# Patient Record
Sex: Female | Born: 1990 | Race: Black or African American | Hispanic: No | Marital: Single | Smoking: Never smoker
Health system: Southern US, Community
[De-identification: ages and names within clinical notes are randomized; demographics above are authoritative.]

## PROBLEM LIST (undated history)

## (undated) ENCOUNTER — Inpatient Hospital Stay (HOSPITAL_COMMUNITY): Payer: Self-pay

## (undated) DIAGNOSIS — A749 Chlamydial infection, unspecified: Secondary | ICD-10-CM

## (undated) DIAGNOSIS — R079 Chest pain, unspecified: Secondary | ICD-10-CM

## (undated) DIAGNOSIS — R519 Headache, unspecified: Secondary | ICD-10-CM

## (undated) DIAGNOSIS — A599 Trichomoniasis, unspecified: Secondary | ICD-10-CM

## (undated) DIAGNOSIS — F410 Panic disorder [episodic paroxysmal anxiety] without agoraphobia: Secondary | ICD-10-CM

## (undated) DIAGNOSIS — I10 Essential (primary) hypertension: Secondary | ICD-10-CM

## (undated) DIAGNOSIS — A549 Gonococcal infection, unspecified: Secondary | ICD-10-CM

## (undated) DIAGNOSIS — J45909 Unspecified asthma, uncomplicated: Secondary | ICD-10-CM

## (undated) HISTORY — DX: Chest pain, unspecified: R07.9

## (undated) HISTORY — PX: WISDOM TOOTH EXTRACTION: SHX21

---
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
## 2011-02-25 ENCOUNTER — Emergency Department (HOSPITAL_COMMUNITY): Payer: Medicaid Other

## 2011-02-25 ENCOUNTER — Emergency Department (HOSPITAL_COMMUNITY)
Admission: EM | Admit: 2011-02-25 | Discharge: 2011-02-25 | Disposition: A | Discharge status: Home / Self Care | Payer: Medicaid Other | Attending: Emergency Medicine | Admitting: Emergency Medicine

## 2011-02-25 DIAGNOSIS — J45909 Unspecified asthma, uncomplicated: Secondary | ICD-10-CM | POA: Insufficient documentation

## 2011-02-25 DIAGNOSIS — R0602 Shortness of breath: Secondary | ICD-10-CM | POA: Insufficient documentation

## 2011-02-25 DIAGNOSIS — Z79899 Other long term (current) drug therapy: Secondary | ICD-10-CM | POA: Insufficient documentation

## 2011-02-25 LAB — BASIC METABOLIC PANEL
BUN: 7 mg/dL (ref 6–23)
Creatinine, Ser: 0.68 mg/dL (ref 0.50–1.10)
GFR calc Af Amer: >60 mL/min (ref >60)
GFR calc non Af Amer: >60 mL/min (ref >60)
Potassium: 2.9 mEq/L — ABNORMAL LOW (ref 3.5–5.1)

## 2011-02-25 LAB — CBC
MCH: 32.2 pg (ref 26.0–34.0)
MCV: 93.8 fL (ref 78.0–100.0)
Platelets: 181 10*3/uL (ref 150–400)
RDW: 12 % (ref 11.5–15.5)
WBC: 5.6 10*3/uL (ref 4.0–10.5)

## 2011-02-25 LAB — DIFFERENTIAL
Basophils Relative: 1 % (ref 0–1)
Eosinophils Absolute: 0.1 10*3/uL (ref 0.0–0.7)
Eosinophils Relative: 1 % (ref 0–5)
Lymphs Abs: 1.9 10*3/uL (ref 0.7–4.0)

## 2011-02-25 MED ORDER — IOHEXOL 300 MG/ML  SOLN
85.0000 mL | Freq: Once | INTRAMUSCULAR | Status: AC | PRN
  Administered 2011-02-25: 85 mL via INTRAVENOUS

## 2011-02-28 LAB — URINALYSIS, ROUTINE W REFLEX MICROSCOPIC
Bilirubin Urine: NEGATIVE
Hgb urine dipstick: NEGATIVE
Protein, ur: NEGATIVE
Urobilinogen, UA: 1

## 2011-02-28 LAB — URINE CULTURE

## 2011-05-30 NOTE — L&D Delivery Note (Signed)
Delivery Note At 8:12 PM a viable and healthy female was delivered via Vaginal, Spontaneous Delivery (Presentation: Left Occiput Anterior).  APGAR: 9, 9; weight 7 lb 11 oz (3487 g).   Placenta status: Intact, Spontaneous.  Cord: 3 vessels.  Anesthesia: Epidural  Episiotomy: None Lacerations: 1st degree at introitus, no suture repair required. Est. Blood Loss (mL): 300  Mom to postpartum.  Baby to nursery-stable.  Aira Sallade D 03/13/2012, 8:54 PM

## 2011-06-10 ENCOUNTER — Emergency Department (HOSPITAL_COMMUNITY)
Admission: EM | Admit: 2011-06-10 | Discharge: 2011-06-10 | Disposition: A | Discharge status: Home / Self Care | Payer: Medicaid Other | Attending: Emergency Medicine | Admitting: Emergency Medicine

## 2011-06-10 ENCOUNTER — Encounter (HOSPITAL_COMMUNITY): Payer: Self-pay | Admitting: Emergency Medicine

## 2011-06-10 ENCOUNTER — Other Ambulatory Visit: Payer: Self-pay

## 2011-06-10 DIAGNOSIS — R079 Chest pain, unspecified: Secondary | ICD-10-CM | POA: Insufficient documentation

## 2011-06-10 DIAGNOSIS — S0083XA Contusion of other part of head, initial encounter: Secondary | ICD-10-CM

## 2011-06-10 DIAGNOSIS — S1093XA Contusion of unspecified part of neck, initial encounter: Secondary | ICD-10-CM | POA: Insufficient documentation

## 2011-06-10 DIAGNOSIS — F410 Panic disorder [episodic paroxysmal anxiety] without agoraphobia: Secondary | ICD-10-CM | POA: Insufficient documentation

## 2011-06-10 DIAGNOSIS — R51 Headache: Secondary | ICD-10-CM | POA: Insufficient documentation

## 2011-06-10 DIAGNOSIS — S0003XA Contusion of scalp, initial encounter: Secondary | ICD-10-CM | POA: Insufficient documentation

## 2011-06-10 HISTORY — DX: Panic disorder (episodic paroxysmal anxiety): F41.0

## 2011-06-10 MED ORDER — LORAZEPAM 1 MG PO TABS
1.0000 mg | ORAL_TABLET | Freq: Once | ORAL | Status: AC
  Administered 2011-06-10: 1 mg via ORAL
  Filled 2011-06-10: qty 1

## 2011-06-10 MED ORDER — ACETAMINOPHEN 325 MG PO TABS
650.0000 mg | ORAL_TABLET | Freq: Once | ORAL | Status: AC
  Administered 2011-06-10: 650 mg via ORAL
  Filled 2011-06-10: qty 2

## 2011-06-10 MED ORDER — LORAZEPAM 2 MG/ML IJ SOLN: 1.0000 mg | Freq: Once | INTRAMUSCULAR | Status: DC
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
## 2011-06-11 NOTE — ED Provider Notes (Signed)
Medical screening examination/treatment/procedure(s) were performed by non-physician practitioner and as supervising physician I was immediately available for consultation/collaboration.  Juliet Rude. Rubin Payor, MD 06/11/11 [phone]

## 2011-08-04 ENCOUNTER — Encounter (HOSPITAL_COMMUNITY): Payer: Self-pay

## 2011-08-04 ENCOUNTER — Inpatient Hospital Stay (HOSPITAL_COMMUNITY)
Admission: AD | Admit: 2011-08-04 | Discharge: 2011-08-04 | Disposition: A | Discharge status: Home Health | Payer: Medicaid Other | Source: Ambulatory Visit | Attending: Obstetrics and Gynecology | Admitting: Obstetrics and Gynecology

## 2011-08-04 DIAGNOSIS — O210 Mild hyperemesis gravidarum: Secondary | ICD-10-CM | POA: Insufficient documentation

## 2011-08-04 DIAGNOSIS — Z3201 Encounter for pregnancy test, result positive: Secondary | ICD-10-CM

## 2011-08-04 HISTORY — DX: Gonococcal infection, unspecified: A54.9

## 2011-08-04 HISTORY — DX: Chlamydial infection, unspecified: A74.9

## 2011-08-04 HISTORY — DX: Trichomoniasis, unspecified: A59.9

## 2011-08-04 LAB — URINALYSIS, ROUTINE W REFLEX MICROSCOPIC
Glucose, UA: NEGATIVE mg/dL
Ketones, ur: NEGATIVE mg/dL
Leukocytes, UA: NEGATIVE
Specific Gravity, Urine: >1.03 — ABNORMAL HIGH (ref 1.005–1.030)
pH: 6 (ref 5.0–8.0)

## 2011-08-04 LAB — POCT PREGNANCY, URINE: Preg Test, Ur: POSITIVE — AB

## 2011-08-04 MED ORDER — PRENATAL RX 60-1 MG PO TABS: 1.0000 | ORAL_TABLET | Freq: Every day | ORAL | Status: AC

## 2011-08-04 MED ORDER — ONDANSETRON HCL 4 MG PO TABS: 4.0000 mg | ORAL_TABLET | Freq: Three times a day (TID) | ORAL | Status: AC | PRN

## 2011-08-04 MED ORDER — PROMETHAZINE HCL 12.5 MG PO TABS: 12.5000 mg | ORAL_TABLET | Freq: Four times a day (QID) | ORAL | Status: AC | PRN

## 2011-08-04 NOTE — ED Provider Notes (Signed)
History     Chief Complaint  Patient presents with  . Emesis  . Amenorrhea   HPI Patient is a 21 yo presenting for nausea and vomiting. States she has increased nausea and vomiting in the morning and at night. Nothing makes it better; nothing makes it worse. LMP was 06/10/11. She CP, SOB, Abd pain, dysuria, vaginal bleeding, vaginal discharge.   Patient does not have an Ob/Gyn provider  OB History    Grav Para Term Preterm Abortions TAB SAB Ect Mult Living   1               Past Medical History  Diagnosis Date  . Panic attack   . Chlamydia   . Gonorrhea   . Trichomonas     History reviewed. No pertinent past surgical history.  History reviewed. No pertinent family history.  History  Substance Use Topics  . Smoking status: Never Smoker   . Smokeless tobacco: Not on file  . Alcohol Use: No    Allergies:  Allergies  Allergen Reactions  . Hydrocodone     Itching and swelling    Prescriptions prior to admission  Medication Sig Dispense Refill  . albuterol (PROVENTIL HFA;VENTOLIN HFA) 108 (90 BASE) MCG/ACT inhaler Inhale 2 puffs into the lungs every 6 (six) hours as needed. For shortness of breath      . naproxen sodium (ANAPROX) 220 MG tablet Take 220 mg by mouth 2 (two) times daily with a meal. For pain        ROS Negative except as noted in HPI  Physical Exam   Blood pressure 124/73, pulse 92, temperature 98.5 F (36.9 C), temperature source Oral, resp. rate 16, height 5' 3.25" (1.607 m), weight 70.534 kg (155 lb 8 oz), last menstrual period 06/10/2011.  Physical Exam  Constitutional: She is oriented to person, place, and time. She appears well-developed and well-nourished. No distress.  HENT:  Head: Normocephalic and atraumatic.  Neck: Normal range of motion.  Cardiovascular: Normal rate, regular rhythm and normal heart sounds.   Respiratory: Effort normal and breath sounds normal. She has no wheezes.  GI: Soft. Bowel sounds are normal. She exhibits no  distension. There is no tenderness.  Musculoskeletal: Normal range of motion. She exhibits no edema and no tenderness.  Neurological: She is alert and oriented to person, place, and time. No cranial nerve deficit.  Skin: Skin is dry. No rash noted.    MAU Course  Procedures Results for orders placed during the hospital encounter of 08/04/11 (from the past 24 hour(s))  URINALYSIS, ROUTINE W REFLEX MICROSCOPIC     Status: Abnormal   Collection Time   08/04/11  8:38 PM      Component Value Range   Color, Urine YELLOW  YELLOW    APPearance CLEAR  CLEAR    Specific Gravity, Urine >1.030 (*) 1.005 - 1.030    pH 6.0  5.0 - 8.0    Glucose, UA NEGATIVE  NEGATIVE (mg/dL)   Hgb urine dipstick NEGATIVE  NEGATIVE    Bilirubin Urine NEGATIVE  NEGATIVE    Ketones, ur NEGATIVE  NEGATIVE (mg/dL)   Protein, ur NEGATIVE  NEGATIVE (mg/dL)   Urobilinogen, UA 0.2  0.0 - 1.0 (mg/dL)   Nitrite NEGATIVE  NEGATIVE    Leukocytes, UA NEGATIVE  NEGATIVE   POCT PREGNANCY, URINE     Status: Abnormal   Collection Time   08/04/11  9:15 PM      Component Value Range   Preg Test,  Ur POSITIVE (*) NEGATIVE     MDM No complaints at this time. Pelvic exam deferred, which was discussed with Joy Hartman.  Assessment and Plan  21 yo G1 at [redacted]w[redacted]d by LMP presenting for nausea - Pregnancy test positive. Patient states she will establish prenatal care from the list provided. Rx for prenatal vitamin sent to pharmacy. - Zofran 4mg  prn for nausea - Bleeding precautions given to patient. - Pregnancy verification letter given to patient - Plan discussed with Joy Hartman who agrees with plan  Joy Hartman 08/04/2011, 9:45 PM

## 2011-08-04 NOTE — Progress Notes (Signed)
Patient is here with c/p lower constant abdominal cramping and nausea since feb. 14th. She denies any vaginal bleeding or discharge.

## 2011-08-04 NOTE — Discharge Instructions (Signed)
    ________________________________________     To schedule your Maternity Eligibility Appointment, please call [phone].  When you arrive for your appointment you must bring the following items or information listed below.  Your appointment will be rescheduled if you do not have these items or are 15 minutes late. If currently receiving Medicaid, you MUST bring: 1. Medicaid Card 2. Social Security Card 3. Picture ID 4. Proof of Pregnancy 5. Verification of current address if the address on Medicaid card is incorrect "postmarked mail" If not receiving Medicaid, you MUST bring: 1. Social Security Card 2. Picture ID 3. Birth Certificate (if available) Passport or *Green Card 4. Proof of Pregnancy 5. Verification of current address "postmarked mail" for each income presented. 6. Verification of insurance coverage, if any 7. Check stubs from each employer for the previous month (if unable to present check stub  for each week, we will accept check stub for the first and last week ill the same month.) If you can't locate check stubs, you must bring a letter from the employer(s) and it must have the following information on letterhead, typed, in English: o name of company o company telephone number o how long been with the company, if less than one month o how much person earns per hour o how many hours per week work o the gross pay the person earned for the previous month If you are 19 years old or less, you do not have to bring proof of income unless you work or live with the father of the baby and at that time we will need proof of income from you and/or the father of the baby. Green Card recipients are eligible for Medicaid for Pregnant Women (MPW)    

## 2011-08-04 NOTE — Progress Notes (Signed)
Pt states, " I've been vomiting for 2-3 weeks mostly in the morning and at night. I missed my period in Feb."

## 2011-08-17 LAB — OB RESULTS CONSOLE GC/CHLAMYDIA: Chlamydia: NEGATIVE

## 2011-09-08 LAB — OB RESULTS CONSOLE HGB/HCT, BLOOD: HCT: 36 %

## 2011-09-08 LAB — OB RESULTS CONSOLE HEPATITIS B SURFACE ANTIGEN: Hepatitis B Surface Ag: NEGATIVE

## 2011-09-08 LAB — OB RESULTS CONSOLE ANTIBODY SCREEN: Antibody Screen: NEGATIVE

## 2011-09-08 LAB — OB RESULTS CONSOLE RUBELLA ANTIBODY, IGM: Rubella: IMMUNE

## 2011-09-08 LAB — OB RESULTS CONSOLE RPR: RPR: NONREACTIVE

## 2012-01-22 ENCOUNTER — Encounter (HOSPITAL_COMMUNITY): Payer: Self-pay | Admitting: *Deleted

## 2012-01-22 ENCOUNTER — Inpatient Hospital Stay (HOSPITAL_COMMUNITY)
Admission: EM | Admit: 2012-01-22 | Discharge: 2012-01-22 | Disposition: A | Discharge status: Home / Self Care | Payer: Medicaid Other | Attending: Obstetrics and Gynecology | Admitting: Obstetrics and Gynecology

## 2012-01-22 DIAGNOSIS — M549 Dorsalgia, unspecified: Secondary | ICD-10-CM | POA: Insufficient documentation

## 2012-01-22 DIAGNOSIS — O479 False labor, unspecified: Secondary | ICD-10-CM

## 2012-01-22 DIAGNOSIS — O99891 Other specified diseases and conditions complicating pregnancy: Secondary | ICD-10-CM | POA: Insufficient documentation

## 2012-01-22 HISTORY — DX: Unspecified asthma, uncomplicated: J45.909

## 2012-01-22 LAB — WET PREP, GENITAL

## 2012-01-22 LAB — POCT I-STAT, CHEM 8
BUN: 4 mg/dL — ABNORMAL LOW (ref 6–23)
Creatinine, Ser: 0.7 mg/dL (ref 0.50–1.10)
Hemoglobin: 10.5 g/dL — ABNORMAL LOW (ref 12.0–15.0)
Potassium: 3.6 mEq/L (ref 3.5–5.1)
Sodium: 137 mEq/L (ref 135–145)

## 2012-01-22 LAB — CBC WITH DIFFERENTIAL/PLATELET
Basophils Absolute: 0 10*3/uL (ref 0.0–0.1)
Lymphocytes Relative: 30 % (ref 12–46)
Lymphs Abs: 2.4 10*3/uL (ref 0.7–4.0)
Neutrophils Relative %: 58 % (ref 43–77)
Platelets: 176 10*3/uL (ref 150–400)
RBC: 3.3 MIL/uL — ABNORMAL LOW (ref 3.87–5.11)
RDW: 12.3 % (ref 11.5–15.5)
WBC: 8 10*3/uL (ref 4.0–10.5)

## 2012-01-22 LAB — URINALYSIS, ROUTINE W REFLEX MICROSCOPIC
Leukocytes, UA: NEGATIVE
Nitrite: NEGATIVE
Protein, ur: NEGATIVE mg/dL
Specific Gravity, Urine: 1.019 (ref 1.005–1.030)
Urobilinogen, UA: 0.2 mg/dL (ref 0.0–1.0)

## 2012-01-22 LAB — FETAL FIBRONECTIN: Fetal Fibronectin: NEGATIVE

## 2012-01-22 MED ORDER — SODIUM CHLORIDE 0.9 % IV BOLUS (SEPSIS)
1000.0000 mL | Freq: Once | INTRAVENOUS | Status: AC
  Administered 2012-01-22: 1000 mL via INTRAVENOUS

## 2012-01-22 NOTE — MAU Note (Signed)
Dr. Arlyce Dice notified of pt's cervical exam, unchanged, ui noted, ffn negative. Pt ready to d/c home. EFM tracing reactive. Orders received to d/c home, f/u in office 01/31/2012 as previously scheduled.

## 2012-01-22 NOTE — MAU Note (Signed)
Patient transferred from [facility]. Patient complains of leaking a few drops of fluid tonight while laying in bed. No vaginal bleeding. Some back pain which patient states is not new.

## 2012-01-22 NOTE — MAU Provider Note (Signed)
  History     CSN: [id number]  Arrival date and time: 01/22/12 0116   None     Chief Complaint  Patient presents with  . Back Pain    pt pregnant   HPI Joy Hartman is a 21 y.o. female @ 106w2d gestation who arrived to MAU via [facility]. Transferred from [facility] ED where she presented for ? ROM and back pain. She was evaluated by the EDP and the OB rapid response nurse. Dr. Claiborne Billings was contacted by the EDP and accepted the patient for transfer.  OB History    Grav Para Term Preterm Abortions TAB SAB Ect Mult Living   1               Past Medical History  Diagnosis Date  . Panic attack   . Chlamydia   . Gonorrhea   . Trichomonas     History reviewed. No pertinent past surgical history.  History reviewed. No pertinent family history.  History  Substance Use Topics  . Smoking status: Never Smoker   . Smokeless tobacco: Not on file  . Alcohol Use: No    Allergies:  Allergies  Allergen Reactions  . Hydrocodone     Itching and swelling    Prescriptions prior to admission  Medication Sig Dispense Refill  . albuterol (PROVENTIL HFA;VENTOLIN HFA) 108 (90 BASE) MCG/ACT inhaler Inhale 2 puffs into the lungs every 6 (six) hours as needed. For shortness of breath      . beclomethasone (QVAR) 40 MCG/ACT inhaler Inhale 2 puffs into the lungs 2 (two) times daily.      . Prenatal Vit-Fe Fumarate-FA (PRENATAL MULTIVITAMIN) 60-1 MG tablet Take 1 tablet by mouth daily.  30 tablet  11    ROS Physical Exam   Blood pressure 120/73, pulse 97, temperature 97.9 F (36.6 C), temperature source Oral, resp. rate 18, last menstrual period 06/10/2011, SpO2 98.00%.  Physical Exam  Constitutional: She is oriented to person, place, and time. She appears well-developed and well-nourished. No distress.  HENT:  Head: Normocephalic and atraumatic.  Eyes: EOM are normal.  Neck: Neck supple.  Cardiovascular: Normal rate.   Respiratory: Effort normal.  GI: Soft. There is no  tenderness.  Genitourinary:       External genitalia without lesions. Thick yellow mucous discharge vaginal vault. Cervix inflamed. Cervical exam: Dilation: 1 Effacement (%): Thick Station: -3 Exam by:: L. Munford RN   Musculoskeletal: Normal range of motion.  Neurological: She is alert and oriented to person, place, and time.  Skin: Skin is warm and dry.  Psychiatric: She has a normal mood and affect. Her behavior is normal. Judgment and thought content normal.   EFM: Baseline 140, irregular contractions with irritability, reactive tracing  MAU Course: Discussed with Dr. Claiborne Billings and she request that we continue to monitor the patient for at least another hour and then recheck cervix.   Procedures Medical screening exam complete and patient stable to continue to monitor. RN to recheck cervix and call Dr. Claiborne Billings with results.  Nancyjo Givhan, RN, FNP, [facility] 01/22/2012, 6:03 AM

## 2012-01-22 NOTE — Progress Notes (Signed)
Notified Dr Claiborne Billings of pt @ [facility] ED and complaints.  Transfer to MAU per Dr. Claiborne Billings.

## 2012-01-22 NOTE — ED Notes (Signed)
Care assumed at this time, Rapid Response OB here with pt.

## 2012-01-22 NOTE — ED Provider Notes (Signed)
Medical screening examination/treatment/procedure(s) were performed by non-physician practitioner and as supervising physician I was immediately available for consultation/collaboration.   Eligio Angert L Camdyn Beske, MD 01/22/12 0815 

## 2012-01-22 NOTE — ED Notes (Addendum)
Pt 7 months pregnant. states back pain for the past two days. Pt state constant across lower back. Pt denies abdominal cramping or pain. Pt denies bleeding or discharge. Pt ambulatory.

## 2012-01-22 NOTE — ED Provider Notes (Signed)
History     CSN: [id number]  Arrival date & time 01/22/12  0116   First MD Initiated Contact with Patient 01/22/12 0139      Chief Complaint  Patient presents with  . Back Pain    pt pregnant    (Consider location/radiation/quality/duration/timing/severity/associated sxs/prior treatment) HPI Comments: Patient is 7 months pregnant having LBP for several days today also having abdominal cramping and thinks she may be leaking fluid vaginally Denies dysuria, N/V/D  States she was seen 8 days ago by her PCP for LBP and told to take tylenol  Tonight pain worse   Patient is a 21 y.o. female presenting with back pain. The history is provided by the patient.  Back Pain  This is a new problem. The current episode started 6 to 12 hours ago. The problem occurs constantly. The problem has been gradually worsening. The pain is present in the lumbar spine. The pain is at a severity of 5/10. The pain is moderate. Pertinent negatives include no chest pain and no fever.    Past Medical History  Diagnosis Date  . Panic attack   . Chlamydia   . Gonorrhea   . Trichomonas     History reviewed. No pertinent past surgical history.  History reviewed. No pertinent family history.  History  Substance Use Topics  . Smoking status: Never Smoker   . Smokeless tobacco: Not on file  . Alcohol Use: No    OB History    Grav Para Term Preterm Abortions TAB SAB Ect Mult Living   1               Review of Systems  Constitutional: Negative for fever and chills.  Respiratory: Negative for shortness of breath.   Cardiovascular: Negative for chest pain and leg swelling.  Genitourinary: Positive for vaginal discharge.  Musculoskeletal: Positive for back pain.    Allergies  Hydrocodone  Home Medications   Current Outpatient Rx  Name Route Sig Dispense Refill  . ALBUTEROL SULFATE HFA 108 (90 BASE) MCG/ACT IN AERS Inhalation Inhale 2 puffs into the lungs every 6 (six) hours as needed. For  shortness of breath    . BECLOMETHASONE DIPROPIONATE 40 MCG/ACT IN AERS Inhalation Inhale 2 puffs into the lungs 2 (two) times daily.    Marland Kitchen PRENATAL RX 60-1 MG PO TABS Oral Take 1 tablet by mouth daily. 30 tablet 11    BP 93/44  Pulse 93  Temp 98.3 F (36.8 C) (Oral)  Resp 18  SpO2 91%  LMP 06/10/2011  Physical Exam  Constitutional: She appears well-developed and well-nourished.  HENT:  Head: Normocephalic.  Eyes: Pupils are equal, round, and reactive to light.  Neck: Normal range of motion.  Cardiovascular: Normal rate.   Pulmonary/Chest: Effort normal.  Abdominal: She exhibits distension.       gravid  Genitourinary:       Deferred to OB response team   Musculoskeletal: Normal range of motion.  Neurological: She is alert.  Skin: Skin is warm.    ED Course  Procedures (including critical care time)  Labs Reviewed  URINALYSIS, ROUTINE W REFLEX MICROSCOPIC - Abnormal; Notable for the following:    APPearance CLOUDY (*)     All other components within normal limits  CBC WITH DIFFERENTIAL - Abnormal; Notable for the following:    RBC 3.30 (*)     Hemoglobin 10.8 (*)     HCT 30.1 (*)     All other components within normal limits  POCT  I-STAT, CHEM 8 - Abnormal; Notable for the following:    BUN 4 (*)     Calcium, Ion 1.24 (*)     Hemoglobin 10.5 (*)     HCT 31.0 (*)     All other components within normal limits   No results found.   1. Preterm contractions       MDM  Will call OB Rapid response team for evaluation, place the fetal monitor         Arman Filter, NP 01/22/12 0404  Arman Filter, NP 01/22/12 0404  Arman Filter, NP 01/22/12 1027

## 2012-01-22 NOTE — Progress Notes (Addendum)
Pt is a G1P0, 32wk2da gestation [facility] 10/19). She is complaining of back pain x 2 days, getting worse earlier tonight; no bleeding or leaking fluid. Fetal monitoring and toco applied.  Marcie Bal RN OBRR On further discussion, pt states she might be ruptured; describes leaking 4 drops of fluid while up to bathroom.  Pt states panties dry and no pad needed.

## 2012-01-23 LAB — GC/CHLAMYDIA PROBE AMP, GENITAL
Chlamydia, DNA Probe: NEGATIVE
GC Probe Amp, Genital: NEGATIVE

## 2012-02-14 LAB — OB RESULTS CONSOLE GBS: GBS: NEGATIVE

## 2012-03-05 ENCOUNTER — Ambulatory Visit (INDEPENDENT_AMBULATORY_CARE_PROVIDER_SITE_OTHER): Payer: Medicaid Other | Admitting: Pediatrics

## 2012-03-13 ENCOUNTER — Inpatient Hospital Stay (HOSPITAL_COMMUNITY): Payer: Medicaid Other | Admitting: Anesthesiology

## 2012-03-13 ENCOUNTER — Inpatient Hospital Stay (HOSPITAL_COMMUNITY)
Admission: AD | Admit: 2012-03-13 | Discharge: 2012-03-15 | DRG: 775 | Disposition: A | Discharge status: Home / Self Care | Payer: Medicaid Other | Source: Ambulatory Visit | Attending: Obstetrics and Gynecology | Admitting: Obstetrics and Gynecology

## 2012-03-13 ENCOUNTER — Encounter (HOSPITAL_COMMUNITY): Payer: Self-pay | Admitting: *Deleted

## 2012-03-13 ENCOUNTER — Encounter (HOSPITAL_COMMUNITY): Payer: Self-pay | Admitting: Anesthesiology

## 2012-03-13 LAB — CBC
HCT: 32.1 % — ABNORMAL LOW (ref 36.0–46.0)
Hemoglobin: 11.4 g/dL — ABNORMAL LOW (ref 12.0–15.0)
MCV: 90.9 fL (ref 78.0–100.0)
RBC: 3.53 MIL/uL — ABNORMAL LOW (ref 3.87–5.11)
WBC: 10.9 10*3/uL — ABNORMAL HIGH (ref 4.0–10.5)

## 2012-03-13 LAB — URINALYSIS, ROUTINE W REFLEX MICROSCOPIC
Bilirubin Urine: NEGATIVE
Glucose, UA: NEGATIVE mg/dL
Specific Gravity, Urine: 1.02 (ref 1.005–1.030)
Urobilinogen, UA: 2 mg/dL — ABNORMAL HIGH (ref 0.0–1.0)

## 2012-03-13 LAB — URINE MICROSCOPIC-ADD ON

## 2012-03-13 LAB — ABO/RH: ABO/RH(D): O POS

## 2012-03-13 MED ORDER — OXYTOCIN 40 UNITS IN LACTATED RINGERS INFUSION - SIMPLE MED
1.0000 m[IU]/min | INTRAVENOUS | Status: DC
  Administered 2012-03-13: 2 m[IU]/min via INTRAVENOUS
  Filled 2012-03-13: qty 1000

## 2012-03-13 MED ORDER — BECLOMETHASONE DIPROPIONATE 40 MCG/ACT IN AERS
1.0000 | INHALATION_SPRAY | Freq: Two times a day (BID) | RESPIRATORY_TRACT | Status: DC
  Administered 2012-03-14 – 2012-03-15 (×3): 1 via RESPIRATORY_TRACT

## 2012-03-13 MED ORDER — WITCH HAZEL-GLYCERIN EX PADS: 1.0000 "application " | MEDICATED_PAD | CUTANEOUS | Status: DC | PRN

## 2012-03-13 MED ORDER — SENNOSIDES-DOCUSATE SODIUM 8.6-50 MG PO TABS
2.0000 | ORAL_TABLET | Freq: Every day | ORAL | Status: DC
  Administered 2012-03-14: 2 via ORAL

## 2012-03-13 MED ORDER — CITRIC ACID-SODIUM CITRATE 334-500 MG/5ML PO SOLN: 30.0000 mL | ORAL | Status: DC | PRN

## 2012-03-13 MED ORDER — PRENATAL MULTIVITAMIN CH
1.0000 | ORAL_TABLET | Freq: Every day | ORAL | Status: DC
  Administered 2012-03-14 – 2012-03-15 (×2): 1 via ORAL
  Filled 2012-03-13 (×2): qty 1

## 2012-03-13 MED ORDER — EPHEDRINE 5 MG/ML INJ: 10.0000 mg | INTRAVENOUS | Status: DC | PRN

## 2012-03-13 MED ORDER — OXYTOCIN 40 UNITS IN LACTATED RINGERS INFUSION - SIMPLE MED
62.5000 mL/h | Freq: Once | INTRAVENOUS | Status: AC
  Administered 2012-03-13: 62.5 mL/h via INTRAVENOUS

## 2012-03-13 MED ORDER — PNEUMOCOCCAL VAC POLYVALENT 25 MCG/0.5ML IJ INJ
0.5000 mL | INJECTION | INTRAMUSCULAR | Status: AC
  Administered 2012-03-14: 0.5 mL via INTRAMUSCULAR
  Filled 2012-03-13: qty 0.5

## 2012-03-13 MED ORDER — OXYTOCIN BOLUS FROM INFUSION
500.0000 mL | Freq: Once | INTRAVENOUS | Status: DC
  Filled 2012-03-13: qty 500

## 2012-03-13 MED ORDER — SIMETHICONE 80 MG PO CHEW: 80.0000 mg | CHEWABLE_TABLET | ORAL | Status: DC | PRN

## 2012-03-13 MED ORDER — LANOLIN HYDROUS EX OINT: TOPICAL_OINTMENT | CUTANEOUS | Status: DC | PRN

## 2012-03-13 MED ORDER — ZOLPIDEM TARTRATE 5 MG PO TABS: 5.0000 mg | ORAL_TABLET | Freq: Every evening | ORAL | Status: DC | PRN

## 2012-03-13 MED ORDER — LIDOCAINE HCL (PF) 1 % IJ SOLN
30.0000 mL | INTRAMUSCULAR | Status: DC | PRN
  Filled 2012-03-13: qty 30

## 2012-03-13 MED ORDER — PHENYLEPHRINE 40 MCG/ML (10ML) SYRINGE FOR IV PUSH (FOR BLOOD PRESSURE SUPPORT)
80.0000 ug | PREFILLED_SYRINGE | INTRAVENOUS | Status: DC | PRN
  Filled 2012-03-13: qty 5

## 2012-03-13 MED ORDER — EPHEDRINE 5 MG/ML INJ
10.0000 mg | INTRAVENOUS | Status: DC | PRN
  Filled 2012-03-13: qty 4

## 2012-03-13 MED ORDER — BUTORPHANOL TARTRATE 1 MG/ML IJ SOLN: 1.0000 mg | INTRAMUSCULAR | Status: DC | PRN

## 2012-03-13 MED ORDER — BENZOCAINE-MENTHOL 20-0.5 % EX AERO
1.0000 "application " | INHALATION_SPRAY | CUTANEOUS | Status: DC | PRN
  Administered 2012-03-15: 1 via TOPICAL
  Filled 2012-03-13: qty 56

## 2012-03-13 MED ORDER — ONDANSETRON HCL 4 MG PO TABS: 4.0000 mg | ORAL_TABLET | ORAL | Status: DC | PRN

## 2012-03-13 MED ORDER — DIBUCAINE 1 % RE OINT: 1.0000 "application " | TOPICAL_OINTMENT | RECTAL | Status: DC | PRN

## 2012-03-13 MED ORDER — OXYCODONE-ACETAMINOPHEN 5-325 MG PO TABS
1.0000 | ORAL_TABLET | ORAL | Status: DC | PRN
  Administered 2012-03-14 (×2): 1 via ORAL
  Filled 2012-03-13 (×2): qty 1

## 2012-03-13 MED ORDER — LACTATED RINGERS IV SOLN: 500.0000 mL | INTRAVENOUS | Status: DC | PRN

## 2012-03-13 MED ORDER — PHENYLEPHRINE 40 MCG/ML (10ML) SYRINGE FOR IV PUSH (FOR BLOOD PRESSURE SUPPORT): 80.0000 ug | PREFILLED_SYRINGE | INTRAVENOUS | Status: DC | PRN

## 2012-03-13 MED ORDER — LIDOCAINE HCL (PF) 1 % IJ SOLN
INTRAMUSCULAR | Status: DC | PRN
  Administered 2012-03-13 (×2): 9 mL

## 2012-03-13 MED ORDER — LACTATED RINGERS IV SOLN
INTRAVENOUS | Status: DC
  Administered 2012-03-13 (×2): via INTRAVENOUS

## 2012-03-13 MED ORDER — ONDANSETRON HCL 4 MG/2ML IJ SOLN: 4.0000 mg | Freq: Four times a day (QID) | INTRAMUSCULAR | Status: DC | PRN

## 2012-03-13 MED ORDER — LACTATED RINGERS IV SOLN
500.0000 mL | Freq: Once | INTRAVENOUS | Status: AC
  Administered 2012-03-13: 14:00:00 via INTRAVENOUS

## 2012-03-13 MED ORDER — INFLUENZA VIRUS VACC SPLIT PF IM SUSP: 0.5000 mL | INTRAMUSCULAR | Status: DC

## 2012-03-13 MED ORDER — ACETAMINOPHEN 325 MG PO TABS: 650.0000 mg | ORAL_TABLET | ORAL | Status: DC | PRN

## 2012-03-13 MED ORDER — DIPHENHYDRAMINE HCL 25 MG PO CAPS: 25.0000 mg | ORAL_CAPSULE | Freq: Four times a day (QID) | ORAL | Status: DC | PRN

## 2012-03-13 MED ORDER — OXYCODONE-ACETAMINOPHEN 5-325 MG PO TABS: 1.0000 | ORAL_TABLET | ORAL | Status: DC | PRN

## 2012-03-13 MED ORDER — ONDANSETRON HCL 4 MG/2ML IJ SOLN: 4.0000 mg | INTRAMUSCULAR | Status: DC | PRN

## 2012-03-13 MED ORDER — TETANUS-DIPHTH-ACELL PERTUSSIS 5-2.5-18.5 LF-MCG/0.5 IM SUSP
0.5000 mL | Freq: Once | INTRAMUSCULAR | Status: AC
  Administered 2012-03-14: 0.5 mL via INTRAMUSCULAR
  Filled 2012-03-13: qty 0.5

## 2012-03-13 MED ORDER — DIPHENHYDRAMINE HCL 50 MG/ML IJ SOLN: 12.5000 mg | INTRAMUSCULAR | Status: DC | PRN

## 2012-03-13 MED ORDER — IBUPROFEN 600 MG PO TABS: 600.0000 mg | ORAL_TABLET | Freq: Four times a day (QID) | ORAL | Status: DC | PRN

## 2012-03-13 MED ORDER — IBUPROFEN 600 MG PO TABS
600.0000 mg | ORAL_TABLET | Freq: Four times a day (QID) | ORAL | Status: DC
  Administered 2012-03-14 – 2012-03-15 (×5): 600 mg via ORAL
  Filled 2012-03-13 (×6): qty 1

## 2012-03-13 MED ORDER — TERBUTALINE SULFATE 1 MG/ML IJ SOLN: 0.2500 mg | Freq: Once | INTRAMUSCULAR | Status: DC | PRN

## 2012-03-13 MED ORDER — FENTANYL 2.5 MCG/ML BUPIVACAINE 1/10 % EPIDURAL INFUSION (WH - ANES)
INTRAMUSCULAR | Status: DC | PRN
  Administered 2012-03-13: 14 mL/h via EPIDURAL

## 2012-03-13 MED ORDER — ALBUTEROL SULFATE HFA 108 (90 BASE) MCG/ACT IN AERS: 2.0000 | INHALATION_SPRAY | Freq: Four times a day (QID) | RESPIRATORY_TRACT | Status: DC | PRN

## 2012-03-13 MED ORDER — FENTANYL 2.5 MCG/ML BUPIVACAINE 1/10 % EPIDURAL INFUSION (WH - ANES)
14.0000 mL/h | INTRAMUSCULAR | Status: DC
  Filled 2012-03-13: qty 125

## 2012-03-13 NOTE — Anesthesia Procedure Notes (Signed)
Epidural Patient location during procedure: OB Start time: 03/13/2012 1:21 PM End time: 03/13/2012 1:25 PM  Staffing Anesthesiologist: Sandrea Hughs Performed by: anesthesiologist   Preanesthetic Checklist Completed: patient identified, site marked, surgical consent, pre-op evaluation, timeout performed, IV checked, risks and benefits discussed and monitors and equipment checked  Epidural Patient position: sitting Prep: site prepped and draped and DuraPrep Patient monitoring: continuous pulse ox and blood pressure Approach: midline Injection technique: LOR air  Needle:  Needle type: Tuohy  Needle gauge: 17 G Needle length: 9 cm and 9 Needle insertion depth: 6 cm Catheter type: closed end flexible Catheter size: 19 Gauge Catheter at skin depth: 10 cm Test dose: negative and Other  Assessment Sensory level: T8 Events: blood not aspirated, injection not painful, no injection resistance, negative IV test and no paresthesia  Additional Notes Reason for block:procedure for pain

## 2012-03-13 NOTE — Anesthesia Preprocedure Evaluation (Signed)
Anesthesia Evaluation  Patient identified by MRN, date of birth, ID band Patient awake    Reviewed: Allergy & Precautions, H&P , NPO status , Patient's Chart, lab work & pertinent test results  Airway Mallampati: II TM Distance: >3 FB Neck ROM: full    Dental No notable dental hx.    Pulmonary  breath sounds clear to auscultation  Pulmonary exam normal       Cardiovascular negative cardio ROS      Neuro/Psych    GI/Hepatic negative GI ROS, Neg liver ROS,   Endo/Other  negative endocrine ROS  Renal/GU negative Renal ROS     Musculoskeletal negative musculoskeletal ROS (+)   Abdominal Normal abdominal exam  (+)   Peds negative pediatric ROS (+)  Hematology negative hematology ROS (+)   Anesthesia Other Findings   Reproductive/Obstetrics (+) Pregnancy                           Anesthesia Physical Anesthesia Plan  ASA: II  Anesthesia Plan: Epidural   Post-op Pain Management:    Induction:   Airway Management Planned:   Additional Equipment:   Intra-op Plan:   Post-operative Plan:   Informed Consent: I have reviewed the patients History and Physical, chart, labs and discussed the procedure including the risks, benefits and alternatives for the proposed anesthesia with the patient or authorized representative who has indicated his/her understanding and acceptance.     Plan Discussed with:   Anesthesia Plan Comments:         Anesthesia Quick Evaluation

## 2012-03-13 NOTE — MAU Note (Signed)
Pt states h/a at present, rates 8/10.

## 2012-03-13 NOTE — Progress Notes (Signed)
Pt c/o "SOB".  Pt assessed with VSS.  Pt instructed that she may feel like that when she has a contraction.  Pt stated tjhat was when she feels it and its brief and goes away. Pt instructed to tell RN if it worsens.  Epidural level is WNL.

## 2012-03-13 NOTE — MAU Note (Signed)
Pt reports bleeding x 1 hour, some contractions. Was 4 cm in office today

## 2012-03-13 NOTE — H&P (Signed)
21 y.o. G1P0  Estimated Date of Delivery: 03/16/12 admitted at 39/[redacted] weeks gestation in labor.  Prenatal Transfer Tool  Maternal Diabetes: No Genetic Screening: Normal Maternal Ultrasounds/Referrals: Normal Fetal Ultrasounds or other Referrals:  None Maternal Substance Abuse:  No Significant Maternal Medications:  None Significant Maternal Lab Results: None Other Significant Pregnancy Complications:  None  Afebrile, VSS Heart and Lungs: No active disease Abdomen: soft, gravid, EFW AGA. Cervical exam:  4.5/100  Impression: Early labor  Plan:  TOL

## 2012-03-13 NOTE — MAU Note (Addendum)
Dr. Arlyce Dice notified pt here for labor eval, bloody show noted, efm tracing reactive, cervix 4/100/-2 intact, vertex. Prior RN felt ?hand with exam, otherwise vertex.  Orders to walk x1 hour and recheck cervix. Bp's also elevated, however no clonus, +1 DTR's, pt states h/a, order for u/a.

## 2012-03-13 NOTE — MAU Note (Signed)
PT SAYS SHE WENT TO B-ROOM- NOTICED BLOOD IN TOILET AT 0600.  HAD PAD ON- - SMALL AMT  ON PAD. NO SEX TONIGHT.   STARTED HURT BAD  ALL DAY.  WENT TO DR  TODAY- VE - 4 CM.  DENIES HSV AND MRSA.

## 2012-03-14 LAB — CBC
Platelets: 157 10*3/uL (ref 150–400)
RBC: 2.82 MIL/uL — ABNORMAL LOW (ref 3.87–5.11)
RDW: 13.1 % (ref 11.5–15.5)
WBC: 11.4 10*3/uL — ABNORMAL HIGH (ref 4.0–10.5)

## 2012-03-14 NOTE — Anesthesia Postprocedure Evaluation (Signed)
  Anesthesia Post-op Note  Patient: Joy Hartman  Procedure(s) Performed: * No procedures listed *  Patient Location: PACU and Mother/Baby  Anesthesia Type: Epidural  Level of Consciousness: awake, alert  and oriented  Airway and Oxygen Therapy: Patient Spontanous Breathing  Post-op Pain: mild  Post-op Assessment: Patient's Cardiovascular Status Stable, Respiratory Function Stable, No signs of Nausea or vomiting, Adequate PO intake and Pain level controlled  Post-op Vital Signs: stable  Complications: No apparent anesthesia complications

## 2012-03-14 NOTE — Progress Notes (Signed)
UR Chart review completed.  

## 2012-03-14 NOTE — Progress Notes (Signed)
PPD#1 Pt without c/o. VSSAF Lochia-mild IMP/ stable Plan/ routine care.

## 2012-03-15 MED ORDER — IBUPROFEN 600 MG PO TABS: 600.0000 mg | ORAL_TABLET | Freq: Four times a day (QID) | ORAL | Status: DC | PRN

## 2012-03-15 MED ORDER — DOCUSATE SODIUM 100 MG PO CAPS: 100.0000 mg | ORAL_CAPSULE | Freq: Two times a day (BID) | ORAL | Status: DC

## 2012-03-15 NOTE — Discharge Summary (Signed)
Obstetric Discharge Summary Reason for Admission: onset of labor Prenatal Procedures: ultrasound Intrapartum Procedures: spontaneous vaginal delivery Postpartum Procedures: none Complications-Operative and Postpartum: 1st degree perineal laceration Hemoglobin  Date Value Range Status  03/14/2012 9.1* 12.0 - 15.0 g/dL Final     REPEATED TO VERIFY     DELTA CHECK NOTED  09/08/2011 12.1   Final     HCT  Date Value Range Status  03/14/2012 25.9* 36.0 - 46.0 % Final  09/08/2011 36   Final    Physical Exam:  General: alert, cooperative and appears stated age Lochia: appropriate Uterine Fundus: firm  Discharge Diagnoses: Term Pregnancy-delivered  Discharge Information: Date: 03/15/2012 Activity: pelvic rest Diet: routine Medications: Ibuprofen, Colace and Vicodin Condition: improved Instructions: refer to practice specific booklet Discharge to: home Follow-up Information    Follow up with Mickel Baas, MD. In 4 weeks. (for a postpartum evaluation)    Contact information:   [address] [place] [address] [phone]          Newborn Data: Live born female  Birth Weight: 7 lb 11 oz (3487 g) APGAR: 9, 9  Home with mother.  Daelin Haste H. 03/15/2012, 8:51 AM

## 2012-12-11 ENCOUNTER — Emergency Department (HOSPITAL_COMMUNITY)
Admission: EM | Admit: 2012-12-11 | Discharge: 2012-12-11 | Disposition: A | Discharge status: Home / Self Care | Payer: Medicaid Other | Attending: Emergency Medicine | Admitting: Emergency Medicine

## 2012-12-11 ENCOUNTER — Encounter (HOSPITAL_COMMUNITY): Payer: Self-pay | Admitting: Emergency Medicine

## 2012-12-11 DIAGNOSIS — L0390 Cellulitis, unspecified: Secondary | ICD-10-CM

## 2012-12-11 DIAGNOSIS — IMO0002 Reserved for concepts with insufficient information to code with codable children: Secondary | ICD-10-CM | POA: Insufficient documentation

## 2012-12-11 DIAGNOSIS — F411 Generalized anxiety disorder: Secondary | ICD-10-CM | POA: Insufficient documentation

## 2012-12-11 DIAGNOSIS — Z8619 Personal history of other infectious and parasitic diseases: Secondary | ICD-10-CM | POA: Insufficient documentation

## 2012-12-11 DIAGNOSIS — L02411 Cutaneous abscess of right axilla: Secondary | ICD-10-CM

## 2012-12-11 DIAGNOSIS — Z79899 Other long term (current) drug therapy: Secondary | ICD-10-CM | POA: Insufficient documentation

## 2012-12-11 DIAGNOSIS — J45909 Unspecified asthma, uncomplicated: Secondary | ICD-10-CM | POA: Insufficient documentation

## 2012-12-11 MED ORDER — IBUPROFEN 400 MG PO TABS
800.0000 mg | ORAL_TABLET | Freq: Once | ORAL | Status: AC
  Administered 2012-12-11: 800 mg via ORAL
  Filled 2012-12-11: qty 2

## 2012-12-11 MED ORDER — SULFAMETHOXAZOLE-TRIMETHOPRIM 800-160 MG PO TABS: 1.0000 | ORAL_TABLET | Freq: Two times a day (BID) | ORAL | Status: DC

## 2012-12-11 NOTE — ED Notes (Signed)
Pt comfortable with d/c and f/u instructions. Prescriptiosn x1

## 2012-12-11 NOTE — ED Notes (Signed)
EDPA at bedside to perform I&D

## 2012-12-11 NOTE — ED Notes (Signed)
PT. PRESENTS WITH  ABSCESS AT RIGHT AXILLA WITH NO DRAINAGE.

## 2012-12-11 NOTE — ED Provider Notes (Signed)
History  This chart was scribed for Glade Nurse, PA-C working with Ashby Dawes, MD by Greggory Stallion, ED scribe. This patient was seen in room TR11C/TR11C and the patient's care was started at 9:28 PM.  CSN: [id number] Arrival date & time 12/11/12  2022   Chief Complaint  Patient presents with  . Abscess    The history is provided by the patient. No language interpreter was used.    HPI Comments: Joy Hartman is a 22 y.o. female who presents to the Emergency Department complaining of abscess to her right axilla that she noticed 3 days ago. She states it has grown and gotten more painful. Pt rates her pain 10/10 and is localized. She states there has been no drainage. Pt denies fever, nausea, and emesis as associated symptoms. She states she has never had an abscess before.   PCP at [facility]  Past Medical History  Diagnosis Date  . Panic attack   . Chlamydia   . Gonorrhea   . Trichomonas   . Asthma    History reviewed. No pertinent past surgical history. Family History  Problem Relation Age of Onset  . Asthma Mother   . Hypertension Mother   . Hypertension Father   . Hypertension Maternal Grandmother   . Hypertension Maternal Grandfather   . Hypertension Paternal Grandmother   . Hypertension Paternal Grandfather    History  Substance Use Topics  . Smoking status: Never Smoker   . Smokeless tobacco: Not on file  . Alcohol Use: No   OB History   Grav Para Term Preterm Abortions TAB SAB Ect Mult Living   1 1 1  0 0 0 0 0 0 1     Review of Systems  Constitutional: Negative for diaphoresis.  HENT: Negative for neck pain and neck stiffness.   Eyes: Negative for visual disturbance.  Respiratory: Negative for apnea, chest tightness and shortness of breath.   Cardiovascular: Negative for chest pain and palpitations.  Gastrointestinal: Negative for diarrhea and constipation.  Genitourinary: Negative for dysuria.  Musculoskeletal: Negative for gait  problem.  Skin: Negative for rash.       Abscess.   Neurological: Negative for dizziness, weakness, light-headedness and numbness.    Allergies  Hydrocodone  Home Medications   Current Outpatient Rx  Name  Route  Sig  Dispense  Refill  . albuterol (PROVENTIL HFA;VENTOLIN HFA) 108 (90 BASE) MCG/ACT inhaler   Inhalation   Inhale 2 puffs into the lungs every 6 (six) hours as needed. For shortness of breath         . beclomethasone (QVAR) 40 MCG/ACT inhaler   Inhalation   Inhale 2 puffs into the lungs 2 (two) times daily.         Marland Kitchen docusate sodium (COLACE) 100 MG capsule   Oral   Take 1 capsule (100 mg total) by mouth 2 (two) times daily.   60 capsule   0   . ibuprofen (ADVIL,MOTRIN) 600 MG tablet   Oral   Take 1 tablet (600 mg total) by mouth every 6 (six) hours as needed for pain.   90 tablet   0    BP 143/92  Pulse 124  Temp(Src) 99.5 F (37.5 C) (Oral)  Resp 20  SpO2 98%  Physical Exam  Nursing note and vitals reviewed. Constitutional: She is oriented to person, place, and time. She appears well-developed and well-nourished. No distress.  HENT:  Head: Normocephalic and atraumatic.  Eyes: Conjunctivae and EOM are  normal.  Neck: Normal range of motion. Neck supple.  No meningeal signs  Cardiovascular: Normal rate, regular rhythm and normal heart sounds.  Exam reveals no gallop and no friction rub.   No murmur heard. Pulmonary/Chest: Effort normal and breath sounds normal. No respiratory distress. She has no wheezes. She has no rales. She exhibits no tenderness.  Abdominal: Soft. Bowel sounds are normal. She exhibits no distension. There is no tenderness. There is no rebound and no guarding.  Musculoskeletal: Normal range of motion. She exhibits no edema and no tenderness.  Neurological: She is alert and oriented to person, place, and time. No cranial nerve deficit.  Skin: Skin is warm and dry. She is not diaphoretic. No erythema.  4 cm abscess erythematous,  edematous, amenable to drainage.   Psychiatric:  anxious    ED Course  Procedures (including critical care time)  DIAGNOSTIC STUDIES: Oxygen Saturation is 98% on RA, normal by my interpretation.    COORDINATION OF CARE: 9:34 PM-Discussed treatment plan which includes IND and antibiotic with pt at bedside and pt agreed to plan.   10:11 PM-After IND, informed pt to keep warm compresses on the abscess so it could continue draining.   INCISION AND DRAINAGE Performed by: Glade Nurse, PA-C Consent: Verbal consent obtained. Risks and benefits: risks, benefits and alternatives were discussed Type: abscess  Body area: right axilla  Anesthesia: local infiltration  Incision was made with a scalpel.  Local anesthetic: lidocaine 2% with epinephrine  Anesthetic total: 5 ml  Complexity: complex Blunt dissection to break up loculations  Drainage: purulent  Drainage amount: copious  Packing material: 1/4 in iodoform gauze  Patient tolerance: Patient tolerated the procedure well with no immediate complications.    Labs Reviewed - No data to display No results found. 1. Abscess of right axilla   2. Cellulitis     MDM  Patient with skin abscess amenable to incision and drainage and packing. Directed wound recheck in 2 days at urgent care, her primary care doctor, or this ED. Encouraged home warm soaks and flushing.  Suspect uncomplicated cellulitis based on limited area of involvement, minimal pain, no systemic signs of illness (eg, fever, chills, dehydration, altered mental status, tachypnea, tachycardia, hypotension), no risk factors for serious illness (eg, extremes of age, general debility, immunocompromised status). Will prescribe Bactrim. Discussed reasons to seek immediate care. Patient expresses understanding and agrees with plan.   I personally performed the services described in this documentation, which was scribed in my presence. The recorded information has been  reviewed and is accurate.    Glade Nurse, PA-C 12/13/12 1331

## 2012-12-14 NOTE — ED Provider Notes (Signed)
Medical screening examination/treatment/procedure(s) were performed by non-physician practitioner and as supervising physician I was immediately available for consultation/collaboration.   Ashby Dawes, MD 12/14/12 [phone]

## 2013-04-03 ENCOUNTER — Other Ambulatory Visit: Payer: Self-pay

## 2013-08-16 ENCOUNTER — Emergency Department (HOSPITAL_COMMUNITY)
Admission: EM | Admit: 2013-08-16 | Discharge: 2013-08-17 | Disposition: A | Discharge status: Home / Self Care | Payer: Medicaid Other | Attending: Emergency Medicine | Admitting: Emergency Medicine

## 2013-08-16 ENCOUNTER — Encounter (HOSPITAL_COMMUNITY): Payer: Self-pay | Admitting: Emergency Medicine

## 2013-08-16 DIAGNOSIS — R059 Cough, unspecified: Secondary | ICD-10-CM | POA: Insufficient documentation

## 2013-08-16 DIAGNOSIS — Z8619 Personal history of other infectious and parasitic diseases: Secondary | ICD-10-CM | POA: Insufficient documentation

## 2013-08-16 DIAGNOSIS — R5381 Other malaise: Secondary | ICD-10-CM | POA: Insufficient documentation

## 2013-08-16 DIAGNOSIS — F410 Panic disorder [episodic paroxysmal anxiety] without agoraphobia: Secondary | ICD-10-CM | POA: Insufficient documentation

## 2013-08-16 DIAGNOSIS — J3489 Other specified disorders of nose and nasal sinuses: Secondary | ICD-10-CM | POA: Insufficient documentation

## 2013-08-16 DIAGNOSIS — J45909 Unspecified asthma, uncomplicated: Secondary | ICD-10-CM | POA: Insufficient documentation

## 2013-08-16 DIAGNOSIS — R05 Cough: Secondary | ICD-10-CM | POA: Insufficient documentation

## 2013-08-16 DIAGNOSIS — R51 Headache: Secondary | ICD-10-CM | POA: Insufficient documentation

## 2013-08-16 DIAGNOSIS — R0602 Shortness of breath: Secondary | ICD-10-CM | POA: Insufficient documentation

## 2013-08-16 DIAGNOSIS — R519 Headache, unspecified: Secondary | ICD-10-CM

## 2013-08-16 DIAGNOSIS — J069 Acute upper respiratory infection, unspecified: Secondary | ICD-10-CM | POA: Insufficient documentation

## 2013-08-16 DIAGNOSIS — R0789 Other chest pain: Secondary | ICD-10-CM | POA: Insufficient documentation

## 2013-08-16 DIAGNOSIS — R5383 Other fatigue: Secondary | ICD-10-CM

## 2013-08-16 NOTE — ED Notes (Signed)
Pt arrived to the ED with a complaint of a headache and chest pain.  Pt has been sick for three days.  Pt has asthma and has had to use her inhaler prior to arrival but has not had any relief.  Pt states her chest pain occurs when she sneezes in which case she feels a pressure sensation.  When at rest the patient feels a pounding sensation.  Pt headache is located all over her head and is a pounding sensation.

## 2013-08-17 MED ORDER — ALBUTEROL SULFATE HFA 108 (90 BASE) MCG/ACT IN AERS
2.0000 | INHALATION_SPRAY | Freq: Once | RESPIRATORY_TRACT | Status: AC
  Administered 2013-08-17: 2 via RESPIRATORY_TRACT
  Filled 2013-08-17: qty 6.7

## 2013-08-17 MED ORDER — DIPHENHYDRAMINE HCL 50 MG/ML IJ SOLN
25.0000 mg | Freq: Once | INTRAMUSCULAR | Status: AC
  Administered 2013-08-17: 25 mg via INTRAMUSCULAR
  Filled 2013-08-17: qty 1

## 2013-08-17 MED ORDER — IPRATROPIUM-ALBUTEROL 0.5-2.5 (3) MG/3ML IN SOLN
3.0000 mL | Freq: Once | RESPIRATORY_TRACT | Status: AC
  Administered 2013-08-17: 3 mL via RESPIRATORY_TRACT
  Filled 2013-08-17: qty 3

## 2013-08-17 MED ORDER — PREDNISONE 20 MG PO TABS
60.0000 mg | ORAL_TABLET | Freq: Once | ORAL | Status: AC
  Administered 2013-08-17: 60 mg via ORAL
  Filled 2013-08-17: qty 3

## 2013-08-17 MED ORDER — KETOROLAC TROMETHAMINE 60 MG/2ML IM SOLN
60.0000 mg | Freq: Once | INTRAMUSCULAR | Status: AC
  Administered 2013-08-17: 60 mg via INTRAMUSCULAR
  Filled 2013-08-17: qty 2

## 2013-08-17 MED ORDER — LORATADINE 10 MG PO TABS: 10.0000 mg | ORAL_TABLET | Freq: Every day | ORAL | Status: DC

## 2013-08-17 MED ORDER — PREDNISONE 20 MG PO TABS: 40.0000 mg | ORAL_TABLET | Freq: Every day | ORAL | Status: DC

## 2013-08-17 MED ORDER — ALBUTEROL SULFATE (2.5 MG/3ML) 0.083% IN NEBU
2.5000 mg | INHALATION_SOLUTION | Freq: Once | RESPIRATORY_TRACT | Status: AC
  Administered 2013-08-17: 2.5 mg via RESPIRATORY_TRACT
  Filled 2013-08-17: qty 3

## 2013-08-17 NOTE — Discharge Instructions (Signed)
Sinus Headache °A sinus headache is when your sinuses become clogged or swollen. Sinus headaches can range from mild to severe.  °CAUSES °A sinus headache can have different causes, such as: °· Colds. °· Sinus infections. °· Allergies. °SYMPTOMS  °Symptoms of a sinus headache may vary and can include: °· Headache. °· Pain or pressure in the face. °· Congested or runny nose. °· Fever. °· Inability to smell. °· Pain in upper teeth. °Weather changes can make symptoms worse. °TREATMENT  °The treatment of a sinus headache depends on the cause. °· Sinus pain caused by a sinus infection may be treated with antibiotic medicine. °· Sinus pain caused by allergies may be helped by allergy medicines (antihistamines) and medicated nasal sprays. °· Sinus pain caused by congestion may be helped by flushing the nose and sinuses with saline solution. °HOME CARE INSTRUCTIONS  °· If antibiotics are prescribed, take them as directed. Finish them even if you start to feel better. °· Only take over-the-counter or prescription medicines for pain, discomfort, or fever as directed by your caregiver. °· If you have congestion, use a nasal spray to help reduce pressure. °SEEK IMMEDIATE MEDICAL CARE IF: °· You have a fever. °· You have headaches more than once a week. °· You have sensitivity to light or sound. °· You have repeated nausea and vomiting. °· You have vision problems. °· You have sudden, severe pain in your face or head. °· You have a seizure. °· You are confused. °· Your sinus headaches do not get better after treatment. Many people think they have a sinus headache when they actually have migraines or tension headaches. °MAKE SURE YOU:  °· Understand these instructions. °· Will watch your condition. °· Will get help right away if you are not doing well or get worse. °Document Released: 06/22/2004 Document Revised: 08/07/2011 Document Reviewed: 08/13/2010 °[employer]® Patient Information ©2014 [employer]. ° °

## 2013-08-17 NOTE — ED Provider Notes (Signed)
CSN: [id number]     Arrival date & time 08/16/13  2323 History   First MD Initiated Contact with Patient 08/17/13 0148     Chief Complaint  Patient presents with  . Headache  . Chest Pain    (Consider location/radiation/quality/duration/timing/severity/associated sxs/prior Treatment) HPI Comments: Patient presents for headache and chest tightness associated with recent acute URI/sinusitis symptoms. No relief of either with NSAIDs. No head injury or trauma. Patient states headache has worsened as sinus congestion has worsened.  Patient is a 23 y.o. female presenting with headaches. The history is provided by the patient. No language interpreter was used.  Headache Pain location:  Frontal Quality:  Dull Radiates to: to "entire head" Onset quality:  Gradual Duration:  3 days Timing:  Constant Progression:  Waxing and waning Chronicity:  New Context: not exposure to bright light   Relieved by:  Nothing Ineffective treatments:  NSAIDs Associated symptoms: congestion, cough, drainage, fatigue and URI   Associated symptoms: no abdominal pain, no blurred vision, no diarrhea, no ear pain, no pain, no fever, no focal weakness, no hearing loss, no nausea, no numbness, no seizures, no syncope and no vomiting      Past Medical History  Diagnosis Date  . Panic attack   . Chlamydia   . Gonorrhea   . Trichomonas   . Asthma    History reviewed. No pertinent past surgical history. Family History  Problem Relation Age of Onset  . Asthma Mother   . Hypertension Mother   . Hypertension Father   . Hypertension Maternal Grandmother   . Hypertension Maternal Grandfather   . Hypertension Paternal Grandmother   . Hypertension Paternal Grandfather    History  Substance Use Topics  . Smoking status: Never Smoker   . Smokeless tobacco: Not on file  . Alcohol Use: No   OB History   Grav Para Term Preterm Abortions TAB SAB Ect Mult Living   1 1 1  0 0 0 0 0 0 1      Review of Systems   Constitutional: Positive for fatigue. Negative for fever.  HENT: Positive for congestion, postnasal drip and rhinorrhea. Negative for ear pain, hearing loss and tinnitus.   Eyes: Negative for blurred vision, pain and visual disturbance.  Respiratory: Positive for cough, chest tightness and shortness of breath.   Cardiovascular: Negative for chest pain and syncope.  Gastrointestinal: Negative for nausea, vomiting, abdominal pain and diarrhea.  Neurological: Positive for headaches. Negative for focal weakness, seizures, syncope, weakness and numbness.  All other systems reviewed and are negative.     Allergies  Hydrocodone  Home Medications   Current Outpatient Rx  Name  Route  Sig  Dispense  Refill  . aspirin-acetaminophen-caffeine (EXCEDRIN MIGRAINE) 250-250-65 MG per tablet   Oral   Take 2 tablets by mouth every 6 (six) hours as needed for headache.         . chlorpheniramine-pseudoephedrine-acetaminophen (SINUTAB) 2-30-500 MG per tablet   Oral   Take 1 tablet by mouth every 4 (four) hours as needed for allergies.         Marland Kitchen. loratadine (CLARITIN) 10 MG tablet   Oral   Take 1 tablet (10 mg total) by mouth daily.   30 tablet   0   . predniSONE (DELTASONE) 20 MG tablet   Oral   Take 2 tablets (40 mg total) by mouth daily.   10 tablet   0    BP 128/97  Pulse 100  Temp(Src) 98.5 F (36.9  C) (Oral)  Resp 22  Wt 200 lb (90.719 kg)  SpO2 100%  Physical Exam  Nursing note and vitals reviewed. Constitutional: She is oriented to person, place, and time. She appears well-developed and well-nourished. No distress.  HENT:  Head: Normocephalic and atraumatic.  Mouth/Throat: Oropharynx is clear and moist. No oropharyngeal exudate.  Audible sinus congestion. No apparent rhinorrhea.  Eyes: Conjunctivae and EOM are normal. Pupils are equal, round, and reactive to light. No scleral icterus.  Neck: Normal range of motion. Neck supple.  Cardiovascular: Normal rate, regular  rhythm and normal heart sounds.   Pulmonary/Chest: Effort normal and breath sounds normal. No respiratory distress. She has no wheezes. She has no rales.  No retractions or accessory muscle use. Chest expansion symmetric.  Musculoskeletal: Normal range of motion.  Neurological: She is alert and oriented to person, place, and time. She has normal reflexes. No cranial nerve deficit. She exhibits normal muscle tone.  GCS 15. Speech is goal oriented. No cranial nerve deficits appreciated; symmetric arteries, no facial drooping, equal tongue protrusion, and normal shoulder shrugging. Patient has equal grip strength bilaterally with normal strength against resistance in all extremities. She moves her extremities without ataxia. No gross sensory deficits appreciated.  Skin: Skin is warm and dry. No rash noted. She is not diaphoretic. No erythema. No pallor.  Psychiatric: She has a normal mood and affect. Her behavior is normal.    ED Course  Procedures (including critical care time) Labs Review Labs Reviewed - No data to display  Imaging Review No results found.   EKG Interpretation None      MDM   Final diagnoses:  Headache  Chest tightness    Patient presents for headache and chest tightness secondary to acute URI/sinusitis symptoms. Chest tightness is worse with coughing. Patient well and nontoxic appearing, hemodynamically stable, and afebrile on arrival. Neurologic exam nonfocal. She denies thunderclap onset of HA. Lungs clear to auscultation bilaterally. No stridor or wheezing. No tachypnea, retractions, or accessory muscle use. Patient treated in ED with DuoNeb, Toradol, Benadryl, and oral prednisone.   Patient endorses improvement in all of her symptoms with this treatment regimen. She states her headache has significantly improved and she feels comfortable managing her symptoms at home; pain down to 5/10 from 10/10. My suspicion for acute intracranial process is low in this  otherwise healthy 23 year old female. Lung sounds stable after breathing treatment. Patient also endorses improvement of this with nebulizer. Patient stable for d/c with albuterol inhaler, 5 day course of prednisone, and Rx for Claritin as patient endorses a hx of seasonal allergies; not currently on any chronic management. Return precautions discussed and patient agreeable to plan with no unaddressed concerns.    Filed Vitals:   08/16/13 2329 08/17/13 0143 08/17/13 0322 08/17/13 0427  BP: 132/81 128/97  142/81  Pulse: 112 100  109  Temp: 98.7 F (37.1 C) 98.5 F (36.9 C)    TempSrc: Oral Oral    Resp: 20 22  18   Weight: 200 lb (90.719 kg)     SpO2: 96% 98% 100% 98%     Antony Madura, PA-C 08/23/13 [phone]

## 2013-08-23 NOTE — ED Provider Notes (Signed)
Medical screening examination/treatment/procedure(s) were performed by non-physician practitioner and as supervising physician I was immediately available for consultation/collaboration.   EKG Interpretation None       Joy NielsenBrian Mason Dibiasio, MD 08/23/13 1356

## 2013-10-09 ENCOUNTER — Emergency Department (HOSPITAL_COMMUNITY)
Admission: EM | Admit: 2013-10-09 | Discharge: 2013-10-09 | Disposition: A | Discharge status: Home / Self Care | Payer: Medicaid Other | Attending: Emergency Medicine | Admitting: Emergency Medicine

## 2013-10-09 ENCOUNTER — Encounter (HOSPITAL_COMMUNITY): Payer: Self-pay | Admitting: Emergency Medicine

## 2013-10-09 DIAGNOSIS — Z79899 Other long term (current) drug therapy: Secondary | ICD-10-CM | POA: Insufficient documentation

## 2013-10-09 DIAGNOSIS — Z8619 Personal history of other infectious and parasitic diseases: Secondary | ICD-10-CM | POA: Insufficient documentation

## 2013-10-09 DIAGNOSIS — Z7982 Long term (current) use of aspirin: Secondary | ICD-10-CM | POA: Insufficient documentation

## 2013-10-09 DIAGNOSIS — J45909 Unspecified asthma, uncomplicated: Secondary | ICD-10-CM | POA: Insufficient documentation

## 2013-10-09 DIAGNOSIS — L02411 Cutaneous abscess of right axilla: Secondary | ICD-10-CM

## 2013-10-09 DIAGNOSIS — IMO0002 Reserved for concepts with insufficient information to code with codable children: Secondary | ICD-10-CM | POA: Insufficient documentation

## 2013-10-09 DIAGNOSIS — Z8659 Personal history of other mental and behavioral disorders: Secondary | ICD-10-CM | POA: Insufficient documentation

## 2013-10-09 MED ORDER — OXYCODONE-ACETAMINOPHEN 5-325 MG PO TABS: 1.0000 | ORAL_TABLET | ORAL | Status: DC | PRN

## 2013-10-09 MED ORDER — LIDOCAINE HCL (PF) 1 % IJ SOLN
5.0000 mL | Freq: Once | INTRAMUSCULAR | Status: AC
  Administered 2013-10-09: 5 mL via INTRADERMAL

## 2013-10-09 MED ORDER — OXYCODONE-ACETAMINOPHEN 5-325 MG PO TABS
2.0000 | ORAL_TABLET | Freq: Once | ORAL | Status: AC
Start: 2013-10-09 — End: 2013-10-09
  Administered 2013-10-09: 2 via ORAL
  Filled 2013-10-09: qty 2

## 2013-10-09 MED ORDER — SULFAMETHOXAZOLE-TRIMETHOPRIM 800-160 MG PO TABS: 1.0000 | ORAL_TABLET | Freq: Two times a day (BID) | ORAL | Status: DC

## 2013-10-09 NOTE — ED Provider Notes (Signed)
CSN: [id number]     Arrival date & time 10/09/13  1955 History  This chart was scribed for non-physician practitioner Dierdre Forth, PA-C, working with Hurman Horn, MD, by Yevette Edwards, ED Scribe. This patient was seen in room TR07C/TR07C and the patient's care was started at 10:21 PM.   First MD Initiated Contact with Patient 10/09/13 2155     Chief Complaint  Patient presents with  . Abscess     The history is provided by the patient. No language interpreter was used.   HPI Comments: Joy Hartman is a 23 y.o. female, with a h/o asthma, who presents to the Emergency Department complaining of a suspected abscess to her right axilla which she first noticed six days ago. The pt has used an ice-pack to the abscess. She denies fever, chills, nausea, and emesis. The pt reports a h/o abscesses. She denies a h/o DM. She also denies daily medications. NO aggravating or alleviating factors.  She has a history of abscess in the axilla which have required incision and drainage in the past.  Past Medical History  Diagnosis Date  . Panic attack   . Chlamydia   . Gonorrhea   . Trichomonas   . Asthma    History reviewed. No pertinent past surgical history. Family History  Problem Relation Age of Onset  . Asthma Mother   . Hypertension Mother   . Hypertension Father   . Hypertension Maternal Grandmother   . Hypertension Maternal Grandfather   . Hypertension Paternal Grandmother   . Hypertension Paternal Grandfather    History  Substance Use Topics  . Smoking status: Never Smoker   . Smokeless tobacco: Not on file  . Alcohol Use: No   OB History   Grav Para Term Preterm Abortions TAB SAB Ect Mult Living   1 1 1  0 0 0 0 0 0 1     Review of Systems  Constitutional: Negative for fever, chills, diaphoresis, appetite change, fatigue and unexpected weight change.  HENT: Negative for mouth sores.   Eyes: Negative for visual disturbance.  Respiratory: Negative for cough, chest  tightness, shortness of breath and wheezing.   Cardiovascular: Negative for chest pain.  Gastrointestinal: Negative for nausea, vomiting, abdominal pain, diarrhea and constipation.  Endocrine: Negative for polydipsia, polyphagia and polyuria.  Genitourinary: Negative for dysuria, urgency, frequency and hematuria.  Musculoskeletal: Negative for back pain and neck stiffness.  Skin: Negative for rash.       Suspected abscess.   Allergic/Immunologic: Negative for immunocompromised state.  Neurological: Negative for syncope, light-headedness and headaches.  Hematological: Does not bruise/bleed easily.  Psychiatric/Behavioral: Negative for sleep disturbance. The patient is not nervous/anxious.   All other systems reviewed and are negative.   Allergies  Hydrocodone  Home Medications   Prior to Admission medications   Medication Sig Start Date End Date Taking? Authorizing Provider  albuterol (PROVENTIL HFA;VENTOLIN HFA) 108 (90 BASE) MCG/ACT inhaler Inhale 2 puffs into the lungs every 6 (six) hours as needed for wheezing or shortness of breath.   Yes Historical Provider, MD  aspirin-acetaminophen-caffeine (EXCEDRIN MIGRAINE) [phone] MG per tablet Take 2 tablets by mouth every 6 (six) hours as needed for headache.   Yes Historical Provider, MD  beclomethasone (QVAR) 80 MCG/ACT inhaler Inhale 2 puffs into the lungs 2 (two) times daily.   Yes Historical Provider, MD  chlorpheniramine-pseudoephedrine-acetaminophen (SINUTAB) 2-30-500 MG per tablet Take 1 tablet by mouth every 4 (four) hours as needed for allergies.   Yes Historical  Provider, MD  loratadine (CLARITIN) 10 MG tablet Take 1 tablet (10 mg total) by mouth daily. 08/17/13  Yes Antony MaduraKelly Humes, PA-C   Triage Vitals: BP 136/84  Pulse 101  Temp(Src) 99.8 F (37.7 C)  Resp 18  SpO2 99%  Physical Exam  Nursing note and vitals reviewed. Constitutional: She is oriented to person, place, and time. She appears well-developed and  well-nourished. No distress.  HENT:  Head: Normocephalic and atraumatic.  Eyes: Conjunctivae and EOM are normal. No scleral icterus.  Neck: Normal range of motion. Neck supple. No tracheal deviation present.  Cardiovascular: Normal rate, regular rhythm and intact distal pulses.   Pulmonary/Chest: Effort normal and breath sounds normal. No respiratory distress.  Abdominal: Soft. She exhibits no distension. There is no tenderness.  Musculoskeletal: Normal range of motion.  Lymphadenopathy:    She has no cervical adenopathy.  Neurological: She is alert and oriented to person, place, and time.  Skin: Skin is warm and dry. She is not diaphoretic. There is erythema.  4 by 4 cm area of induration, erythema, increased warmth to right axilla with obvious area of fluctuance.    Psychiatric: She has a normal mood and affect. Her behavior is normal.    ED Course  Procedures (including critical care time)  DIAGNOSTIC STUDIES: Oxygen Saturation is 99% on room air, normal by my interpretation.    COORDINATION OF CARE:  10:14 PM- Discussed treatment plan with patient, and the patient agreed to the plan. The plan includes incision and drainage as well as a prescription for an antibiotic and pain medication.   10:16 PM- INCISION AND DRAINAGE PROCEDURE NOTE: Patient identification was confirmed and verbal consent was obtained. This procedure was performed by Dierdre ForthHannah Sidonie Dexheimer, PA-C, at 10:16 PM. Site:  Right  Sterile procedures observed  Needle size: 25 gauge Anesthetic used (type and amt): 1% lidocaine without epi; 8 cc used  Blade size: 11 Drainage: Moderate purulent Complexity: Complex Site anesthetized, incision made over site, wound drained and explored loculations, rinsed with copious amounts of normal saline, wound packed with sterile gauze, covered with dry, sterile dressing.  Pt tolerated procedure well without complications.  Instructions for care discussed verbally and pt provided  with additional written instructions for homecare and f/u.   Labs Review Labs Reviewed - No data to display  Imaging Review No results found.   EKG Interpretation None      MDM   Final diagnoses:  Abscess of right axilla   Joy Hartman presents with Hx and PE consistent with abscess.  Patient with skin abscess amenable to incision and drainage.  Abscess was not large enough to warrant packing or drain,  wound recheck in 2 days. Encouraged home warm soaks and flushing.  Mild signs of cellulitis is surrounding skin.  Will d/c to home.  Patient reports requiring antibiotic therapy in the past for her abscesses. We'll discharge home with Bactrim.  Patient is followup. Her doctor in 2 days for wound check. She is to return here to the emergency department for high fevers, worsening erythema or any other concerning symptoms.  It has been determined that no acute conditions requiring further emergency intervention are present at this time. The patient/guardian have been advised of the diagnosis and plan. We have discussed signs and symptoms that warrant return to the ED, such as changes or worsening in symptoms.   Vital signs are stable at discharge. Pt very nervous about procedure and tachycardic at triage.  NO tachycardia on my exam.  BP 136/84  Pulse 101  Temp(Src) 99.8 F (37.7 C)  Resp 18  SpO2 99%  Patient/guardian has voiced understanding and agreed to follow-up with the PCP or specialist.  I personally performed the services described in this documentation, which was scribed in my presence. The recorded information has been reviewed and is accurate.    Dahlia ClientHannah Braddock Servellon, PA-C 10/10/13 [phone]

## 2013-10-09 NOTE — ED Notes (Signed)
Pt. reports right upper arm / right shoulder joint pain onset last week denies injury , pain worse with movement .

## 2013-10-09 NOTE — Discharge Instructions (Signed)
1. Medications: percocet, vicodin, usual home medications 2. Treatment: rest, drink plenty of fluids,  3. Follow Up: Please followup with your primary doctor for discussion of your diagnoses and further evaluation after today's visit; if you do not have a primary care doctor use the resource guide provided to find one;    Abscess An abscess is an infected area that contains a collection of pus and debris.It can occur in almost any part of the body. An abscess is also known as a furuncle or boil. CAUSES  An abscess occurs when tissue gets infected. This can occur from blockage of oil or sweat glands, infection of hair follicles, or a minor injury to the skin. As the body tries to fight the infection, pus collects in the area and creates pressure under the skin. This pressure causes pain. People with weakened immune systems have difficulty fighting infections and get certain abscesses more often.  SYMPTOMS Usually an abscess develops on the skin and becomes a painful mass that is red, warm, and tender. If the abscess forms under the skin, you may feel a moveable soft area under the skin. Some abscesses break open (rupture) on their own, but most will continue to get worse without care. The infection can spread deeper into the body and eventually into the bloodstream, causing you to feel ill.  DIAGNOSIS  Your caregiver will take your medical history and perform a physical exam. A sample of fluid may also be taken from the abscess to determine what is causing your infection. TREATMENT  Your caregiver may prescribe antibiotic medicines to fight the infection. However, taking antibiotics alone usually does not cure an abscess. Your caregiver may need to make a small cut (incision) in the abscess to drain the pus. In some cases, gauze is packed into the abscess to reduce pain and to continue draining the area. HOME CARE INSTRUCTIONS   Only take over-the-counter or prescription medicines for pain,  discomfort, or fever as directed by your caregiver.  If you were prescribed antibiotics, take them as directed. Finish them even if you start to feel better.  If gauze is used, follow your caregiver's directions for changing the gauze.  To avoid spreading the infection:  Keep your draining abscess covered with a bandage.  Wash your hands well.  Do not share personal care items, towels, or whirlpools with others.  Avoid skin contact with others.  Keep your skin and clothes clean around the abscess.  Keep all follow-up appointments as directed by your caregiver. SEEK MEDICAL CARE IF:   You have increased pain, swelling, redness, fluid drainage, or bleeding.  You have muscle aches, chills, or a general ill feeling.  You have a fever. MAKE SURE YOU:   Understand these instructions.  Will watch your condition.  Will get help right away if you are not doing well or get worse. Document Released: 02/22/2005 Document Revised: 11/14/2011 Document Reviewed: 07/28/2011 [facility] Patient Information 2014 [employer] [employer] [place].

## 2013-10-10 NOTE — ED Provider Notes (Signed)
Medical screening examination/treatment/procedure(s) were performed by non-physician practitioner and as supervising physician I was immediately available for consultation/collaboration.   EKG Interpretation None       Maykayla Highley M Lorance Pickeral, MD 10/10/13 2116 

## 2014-03-05 ENCOUNTER — Emergency Department (HOSPITAL_COMMUNITY)
Admission: EM | Admit: 2014-03-05 | Discharge: 2014-03-06 | Disposition: A | Discharge status: Home / Self Care | Payer: Medicaid Other | Attending: Emergency Medicine | Admitting: Emergency Medicine

## 2014-03-05 ENCOUNTER — Encounter (HOSPITAL_COMMUNITY): Payer: Self-pay | Admitting: Emergency Medicine

## 2014-03-05 DIAGNOSIS — J45909 Unspecified asthma, uncomplicated: Secondary | ICD-10-CM | POA: Insufficient documentation

## 2014-03-05 DIAGNOSIS — F410 Panic disorder [episodic paroxysmal anxiety] without agoraphobia: Secondary | ICD-10-CM | POA: Diagnosis not present

## 2014-03-05 DIAGNOSIS — R51 Headache: Secondary | ICD-10-CM | POA: Insufficient documentation

## 2014-03-05 DIAGNOSIS — R519 Headache, unspecified: Secondary | ICD-10-CM

## 2014-03-05 DIAGNOSIS — Z8619 Personal history of other infectious and parasitic diseases: Secondary | ICD-10-CM | POA: Diagnosis not present

## 2014-03-05 DIAGNOSIS — Z79899 Other long term (current) drug therapy: Secondary | ICD-10-CM | POA: Insufficient documentation

## 2014-03-05 LAB — I-STAT CHEM 8, ED
BUN: 4 mg/dL — ABNORMAL LOW (ref 6–23)
CREATININE: 0.8 mg/dL (ref 0.50–1.10)
Calcium, Ion: 1.1 mmol/L — ABNORMAL LOW (ref 1.12–1.23)
Chloride: 102 mEq/L (ref 96–112)
Glucose, Bld: 86 mg/dL (ref 70–99)
HCT: 40 % (ref 36.0–46.0)
HEMOGLOBIN: 13.6 g/dL (ref 12.0–15.0)
POTASSIUM: 3.8 meq/L (ref 3.7–5.3)
SODIUM: 139 meq/L (ref 137–147)
TCO2: 23 mmol/L (ref 0–100)

## 2014-03-05 MED ORDER — PROCHLORPERAZINE MALEATE 10 MG PO TABS: 10.0000 mg | ORAL_TABLET | Freq: Two times a day (BID) | ORAL | Status: DC | PRN

## 2014-03-05 MED ORDER — ONDANSETRON HCL 4 MG/2ML IJ SOLN
4.0000 mg | Freq: Once | INTRAMUSCULAR | Status: AC
  Administered 2014-03-05: 4 mg via INTRAVENOUS
  Filled 2014-03-05: qty 2

## 2014-03-05 MED ORDER — DIPHENHYDRAMINE HCL 50 MG/ML IJ SOLN
25.0000 mg | Freq: Once | INTRAMUSCULAR | Status: AC
  Administered 2014-03-05: 25 mg via INTRAVENOUS
  Filled 2014-03-05: qty 1

## 2014-03-05 MED ORDER — SODIUM CHLORIDE 0.9 % IV BOLUS (SEPSIS)
1000.0000 mL | Freq: Once | INTRAVENOUS | Status: AC
  Administered 2014-03-05: 1000 mL via INTRAVENOUS

## 2014-03-05 MED ORDER — PROCHLORPERAZINE EDISYLATE 5 MG/ML IJ SOLN
10.0000 mg | Freq: Once | INTRAMUSCULAR | Status: AC
  Administered 2014-03-05: 10 mg via INTRAVENOUS
  Filled 2014-03-05: qty 2

## 2014-03-05 MED ORDER — MORPHINE SULFATE 4 MG/ML IJ SOLN
4.0000 mg | Freq: Once | INTRAMUSCULAR | Status: AC
  Administered 2014-03-05: 4 mg via INTRAVENOUS
  Filled 2014-03-05: qty 1

## 2014-03-05 NOTE — Discharge Instructions (Signed)

## 2014-03-05 NOTE — ED Notes (Signed)
Pt c/o migraine with dizziness since last week. Pt taken Excedrin at home without relief.

## 2014-03-05 NOTE — ED Provider Notes (Signed)
CSN: [id number]     Arrival date & time 03/05/14  1858 History   First MD Initiated Contact with Patient 03/05/14 2154     Chief Complaint  Patient presents with  . Headache     (Consider location/radiation/quality/duration/timing/severity/associated sxs/prior Treatment) HPI Comments: Patient is a 23 year old female with history of gonorrhea, Chlamydia, trichomonas, asthma who presents to the emergency department today for evaluation of headache. Her headache has been gradually worsening for the past week. When she woke up today her headache worsened acutely. She describes her headache as pounding. She has associated photophobia and phonophobia. She has history of migraines. She does admit to intermittent tingling and numbness in her hands and feet. She is now currently experiencing the sensation. She also had a brief bout of chest pain and shortness of breath. She attributes this to the pain in her head being so severe that it "took her breath away". She has taken Excedrin, Tylenol, Advil without relief of her symptoms. This feels like her normal migraines, but is lasting longer than normal.  Patient is a 23 y.o. female presenting with headaches. The history is provided by the patient. No language interpreter was used.  Headache Associated symptoms: dizziness   Associated symptoms: no fever and no vomiting     Past Medical History  Diagnosis Date  . Panic attack   . Chlamydia   . Gonorrhea   . Trichomonas   . Asthma    History reviewed. No pertinent past surgical history. Family History  Problem Relation Age of Onset  . Asthma Mother   . Hypertension Mother   . Hypertension Father   . Hypertension Maternal Grandmother   . Hypertension Maternal Grandfather   . Hypertension Paternal Grandmother   . Hypertension Paternal Grandfather    History  Substance Use Topics  . Smoking status: Never Smoker   . Smokeless tobacco: Not on file  . Alcohol Use: No   OB History   Grav Para  Term Preterm Abortions TAB SAB Ect Mult Living   1 1 1  0 0 0 0 0 0 1     Review of Systems  Constitutional: Negative for fever and chills.  Gastrointestinal: Negative for vomiting.  Neurological: Positive for dizziness, light-headedness and headaches.  All other systems reviewed and are negative.     Allergies  Hydrocodone  Home Medications   Prior to Admission medications   Medication Sig Start Date End Date Taking? Authorizing Provider  albuterol (PROVENTIL HFA;VENTOLIN HFA) 108 (90 BASE) MCG/ACT inhaler Inhale 2 puffs into the lungs every 6 (six) hours as needed for wheezing or shortness of breath.   Yes Historical Provider, MD  aspirin-acetaminophen-caffeine (EXCEDRIN MIGRAINE) [phone] MG per tablet Take 2 tablets by mouth every 6 (six) hours as needed for headache.   Yes Historical Provider, MD  ibuprofen (ADVIL,MOTRIN) 200 MG tablet Take 200 mg by mouth every 6 (six) hours as needed for headache.   Yes Historical Provider, MD  loratadine (CLARITIN) 10 MG tablet Take 1 tablet (10 mg total) by mouth daily. 08/17/13  Yes Antony MaduraKelly Humes, PA-C  prochlorperazine (COMPAZINE) 10 MG tablet Take 1 tablet (10 mg total) by mouth 2 (two) times daily as needed for nausea or vomiting (Nausea ). 03/05/14   Mora BellmanHannah S Dawnisha Marquina, PA-C   BP 124/74  Pulse 85  Temp(Src) 98.7 F (37.1 C) (Oral)  Resp 20  Ht 5\' 6"  (1.676 m)  Wt 197 lb (89.359 kg)  BMI 31.81 kg/m2  SpO2 100% Physical Exam  Nursing  note and vitals reviewed. Constitutional: She is oriented to person, place, and time. She appears well-developed and well-nourished. No distress.  HENT:  Head: Normocephalic and atraumatic.  Right Ear: External ear normal.  Left Ear: External ear normal.  Nose: Nose normal.  Mouth/Throat: Oropharynx is clear and moist.  No temporal artery tenderness  Eyes: Conjunctivae and EOM are normal. Pupils are equal, round, and reactive to light.  Neck: Normal range of motion.  No nuchal rigidity or meningeal  signs  Cardiovascular: Normal rate, regular rhythm, normal heart sounds, intact distal pulses and normal pulses.   Pulses:      Radial pulses are 2+ on the right side, and 2+ on the left side.       Posterior tibial pulses are 2+ on the right side, and 2+ on the left side.  Pulmonary/Chest: Effort normal and breath sounds normal. No stridor. No respiratory distress. She has no wheezes. She has no rales.  Abdominal: Soft. She exhibits no distension. There is no tenderness.  Musculoskeletal: Normal range of motion.  Moves all extremities without guarding or ataxia  Neurological: She is alert and oriented to person, place, and time. She has normal strength. No sensory deficit. GCS eye subscore is 4. GCS verbal subscore is 5. GCS motor subscore is 6.  Grip strength 5 out of 5 bilaterally. Finger-nose-finger normal. Rapid alternating movements normal. Heel-knee-shin normal. Showed 5 out of 5 in lower extremities bilaterally  Skin: Skin is warm and dry. She is not diaphoretic. No erythema.  Psychiatric: She has a normal mood and affect. Her behavior is normal.    ED Course  Procedures (including critical care time) Labs Review Labs Reviewed  I-STAT CHEM 8, ED - Abnormal; Notable for the following:    BUN 4 (*)    Calcium, Ion 1.10 (*)    All other components within normal limits    Imaging Review No results found.   EKG Interpretation   Date/Time:  Thursday March 05 2014 23:15:24 EDT Ventricular Rate:  74 PR Interval:  121 QRS Duration: 99 QT Interval:  382 QTC Calculation: 424 R Axis:   65 Text Interpretation:  Sinus rhythm Rate slower No significant change was  found Confirmed by Manus Gunning  MD, STEPHEN [phone]) on 03/05/2014 11:19:50 PM      MDM   Final diagnoses:  Acute nonintractable headache, unspecified headache type   Pt HA treated and improved while in ED.  Presentation is like pts typical HA and non concerning for [facility], ICH, Meningitis, or temporal arteritis. Pt is  afebrile with no focal neuro deficits, nuchal rigidity, or change in vision. Pt is to follow up with PCP to discuss prophylactic medication. Pt verbalizes understanding and is agreeable with plan to dc.    Mora Bellman, PA-C 03/06/14 0004

## 2014-03-06 NOTE — ED Provider Notes (Signed)
Medical screening examination/treatment/procedure(s) were conducted as a shared visit with non-physician practitioner(s) and myself.  I personally evaluated the patient during the encounter.  1 week history of gradual onset headache.  Associated with nausea and photophobia.  No fever.  No weakness, numbness, tingling. Denies thunderclap onset. No meningismus CN 2-12 intact, no ataxia on finger to nose, no nystagmus, 5/5 strength throughout, no pronator drift, Romberg negative, normal gait.    EKG Interpretation   Date/Time:  Thursday March 05 2014 23:15:24 EDT Ventricular Rate:  74 PR Interval:  121 QRS Duration: 99 QT Interval:  382 QTC Calculation: 424 R Axis:   65 Text Interpretation:  Sinus rhythm Rate slower No significant change was  found Confirmed by Manus GunningANCOUR  MD, Askia Hazelip [phone] on 03/05/2014 11:19:50 PM        Glynn OctaveStephen Zaliyah Meikle, MD 03/06/14 [phone]

## 2014-03-30 ENCOUNTER — Ambulatory Visit (INDEPENDENT_AMBULATORY_CARE_PROVIDER_SITE_OTHER): Payer: Medicaid Other | Admitting: Family Medicine

## 2014-03-30 ENCOUNTER — Encounter: Payer: Self-pay | Admitting: Family Medicine

## 2014-03-30 VITALS — BP 137/83 | HR 71 | Temp 98.1°F | Ht 66.0 in | Wt 195.0 lb

## 2014-03-30 DIAGNOSIS — Z0000 Encounter for general adult medical examination without abnormal findings: Secondary | ICD-10-CM

## 2014-03-30 DIAGNOSIS — J45909 Unspecified asthma, uncomplicated: Secondary | ICD-10-CM

## 2014-03-30 DIAGNOSIS — F410 Panic disorder [episodic paroxysmal anxiety] without agoraphobia: Secondary | ICD-10-CM

## 2014-03-30 DIAGNOSIS — R7309 Other abnormal glucose: Secondary | ICD-10-CM | POA: Insufficient documentation

## 2014-03-30 LAB — POCT GLYCOSYLATED HEMOGLOBIN (HGB A1C): HEMOGLOBIN A1C: 5.8

## 2014-03-30 LAB — GLUCOSE, CAPILLARY: Glucose-Capillary: 73 mg/dL (ref 70–99)

## 2014-03-30 MED ORDER — ALBUTEROL SULFATE HFA 108 (90 BASE) MCG/ACT IN AERS: 2.0000 | INHALATION_SPRAY | Freq: Four times a day (QID) | RESPIRATORY_TRACT | Status: DC | PRN

## 2014-03-30 NOTE — Assessment & Plan Note (Signed)
Well controlled. Refilled albuterol. Continue Qvar daily.

## 2014-03-30 NOTE — Progress Notes (Signed)
Joy Hartman is a 23 y.o. [facility] Her concerns today include:  HPI:  Asthma: Long standing history. Patient is on a stable regimen of Qvar daily and albuterol. Reports using her albuterol less than one time per week. No recent hospitalizations or ED visits.   Panic Attacks: Long standing history. Patient states that they occur several times per week, and mostly occur while she is in the car. Describes increased heart rate, feelings of doom, and sometimes feels like she is going to pass out. Episodes can last as long as 30 minutes. Can not identify any trigger and they seem to occur randomly. Has not previously sought medical attention.   Elevated Blood glucose: Patient reports that her CBGs have been in the 200s at her previous care provider. Has never been diagnosed with diabetes. Endorses polydipsia and polyuria. No new rashes. No vision changes. No burning sensations in feet or hands.    ROS: As per HPI, otherwise all systems reviewed and are negative.  Past Medical History - Reviewed and updated Patient Active Problem List   Diagnosis Date Noted  . Panic attacks 03/30/2014  . Elevated random blood glucose level 03/30/2014  . Healthcare maintenance 03/30/2014  . Asthma 07/13/2010  . HEADACHE 07/13/2010    Medications- reviewed and updated Current Outpatient Prescriptions  Medication Sig Dispense Refill  . albuterol (PROVENTIL HFA;VENTOLIN HFA) 108 (90 BASE) MCG/ACT inhaler Inhale 2 puffs into the lungs every 6 (six) hours as needed for wheezing or shortness of breath. 1 Inhaler 2  . ibuprofen (ADVIL,MOTRIN) 200 MG tablet Take 200 mg by mouth every 6 (six) hours as needed for headache.    . loratadine (CLARITIN) 10 MG tablet Take 1 tablet (10 mg total) by mouth daily. 30 tablet 0  . prochlorperazine (COMPAZINE) 10 MG tablet Take 1 tablet (10 mg total) by mouth 2 (two) times daily as needed for nausea or vomiting (Nausea ). 10  tablet 0  . [DISCONTINUED] fexofenadine (ALLEGRA) 180 MG tablet Take 180 mg by mouth daily as needed for allergies or rhinitis.     No current facility-administered medications for this visit.   Family History  Problem Relation Age of Onset  . Asthma Mother   . Hypertension Mother   . Hypertension Father   . Hypertension Maternal Grandmother   . Hypertension Maternal Grandfather   . Hypertension Paternal Grandmother   . Hypertension Paternal Grandfather    History   Social History  . Marital Status: Single    Spouse Name: N/A    Number of Children: N/A  . Years of Education: N/A   Social History Main Topics  . Smoking status: Never Smoker   . Smokeless tobacco: None  . Alcohol Use: No  . Drug Use: No  . Sexual Activity: Yes   Other Topics Concern  . None   Social History Narrative     Objective: Physical Exam: BP 137/83 mmHg  Pulse 71  Temp(Src) 98.1 F (36.7 C) (Oral)  Ht 5\' 6"  (1.676 m)  Wt 195 lb (88.451 kg)  BMI 31.49 kg/m2  Gen: NAD, resting comfortably CV: RRR with no murmurs appreciated Lungs: NWOB, CTAB with no crackles, wheezes, or rhonchi Abdomen: Normal bowel sounds present. Soft, Nontender, Nondistended. Ext: no edema Skin: warm, dry Neuro: grossly normal, moves all extremities Psych: Affect appropriate, normal mood  Results for orders placed or performed in visit on 03/30/14 (from the past 72 hour(s))  Glucose, capillary  Status: None   Collection Time: 03/30/14  4:49 PM  Result Value Ref Range   Glucose-Capillary 73 70 - 99 mg/dL    A/P: See problem list  Asthma Well controlled. Refilled albuterol. Continue Qvar daily.   Panic attacks No clear precipitating events. Will give information to get in contact with out psychologist. Plan to discuss further at next appointment. No pharmacologic intervention at this time.   Elevated random blood glucose level Patient reports history of elevated blood glucose to 200s at previous provider.  Additionally with polydipsia and polyuria. Will obtain POC CBG, BMP, A1C, and lipid panel today to evaluate for possible DM and hypercholesterolemia.   Healthcare maintenance Pt reports that [facility] Reports having one last year which was normal.     Orders Placed This Encounter  Procedures  . Lipid Panel  . Basic Metabolic Panel  . Glucose, capillary  . POCT UA - Microscopic Only  . POCT CBG (Fasting - Glucose)  . HgB A1c    Meds ordered this encounter  Medications  . albuterol (PROVENTIL HFA;VENTOLIN HFA) 108 (90 BASE) MCG/ACT inhaler    Sig: Inhale 2 puffs into the lungs every 6 (six) hours as needed for wheezing or shortness of breath.    Dispense:  1 Inhaler    Refill:  2     Caleb M. Jimmey RalphParker, MD [facility] Resident PGY-1 03/30/2014 5:17 PM

## 2014-03-30 NOTE — Patient Instructions (Signed)
Thank you for coming to the clinic today. It was nice seeing you.  For your asthma, I have refilled your albuterol. Continue using the Qvar daily.  For your panic attacks, I have given you a card for our psychologist Dr Pascal LuxKane. We can talk more about this at our next visit as well.  For your Blood sugar, we are checking blood work today. If elevated, we can call your to schedule another appointment for discussion of further management.  See you again in 3 months or sooner if the blood sugar comes back high.

## 2014-03-30 NOTE — Assessment & Plan Note (Addendum)
Patient reports history of elevated blood glucose to 200s at previous provider. Additionally with polydipsia and polyuria. Will obtain POC CBG, BMP, A1C, and lipid panel today to evaluate for possible DM and hypercholesterolemia.

## 2014-03-30 NOTE — Assessment & Plan Note (Signed)
Pt reports that she has [facility]. Reports having one last year which was normal.

## 2014-03-30 NOTE — Assessment & Plan Note (Signed)
No clear precipitating events. Will give information to get in contact with out psychologist. Plan to discuss further at next appointment. No pharmacologic intervention at this time.

## 2014-03-31 ENCOUNTER — Telehealth: Payer: Self-pay | Admitting: Family Medicine

## 2014-03-31 LAB — BASIC METABOLIC PANEL
BUN: 5 mg/dL — ABNORMAL LOW (ref 6–23)
CO2: 21 mEq/L (ref 19–32)
Calcium: 9.6 mg/dL (ref 8.4–10.5)
Chloride: 102 mEq/L (ref 96–112)
Creat: 0.56 mg/dL (ref 0.50–1.10)
Glucose, Bld: 75 mg/dL (ref 70–99)
Potassium: 4.2 mEq/L (ref 3.5–5.3)
Sodium: 136 mEq/L (ref 135–145)

## 2014-03-31 LAB — LIPID PANEL
CHOLESTEROL: 163 mg/dL (ref 0–200)
HDL: 36 mg/dL — AB (ref >39)
LDL CALC: 111 mg/dL — AB (ref 0–99)
TRIGLYCERIDES: 82 mg/dL (ref <150)
Total CHOL/HDL Ratio: 4.5 Ratio
VLDL: 16 mg/dL (ref 0–40)

## 2014-03-31 NOTE — Telephone Encounter (Signed)
Pt is aware of results. Antoinette Borgwardt,CMA  

## 2014-09-17 ENCOUNTER — Encounter (HOSPITAL_COMMUNITY): Payer: Self-pay | Admitting: Emergency Medicine

## 2014-09-17 ENCOUNTER — Emergency Department (HOSPITAL_COMMUNITY)
Admission: EM | Admit: 2014-09-17 | Discharge: 2014-09-17 | Disposition: A | Discharge status: Home / Self Care | Payer: Medicaid Other | Attending: Emergency Medicine | Admitting: Emergency Medicine

## 2014-09-17 ENCOUNTER — Emergency Department (HOSPITAL_COMMUNITY): Payer: Medicaid Other

## 2014-09-17 DIAGNOSIS — Z8659 Personal history of other mental and behavioral disorders: Secondary | ICD-10-CM | POA: Insufficient documentation

## 2014-09-17 DIAGNOSIS — M79642 Pain in left hand: Secondary | ICD-10-CM | POA: Diagnosis present

## 2014-09-17 DIAGNOSIS — Z79899 Other long term (current) drug therapy: Secondary | ICD-10-CM | POA: Diagnosis not present

## 2014-09-17 DIAGNOSIS — M779 Enthesopathy, unspecified: Secondary | ICD-10-CM | POA: Diagnosis not present

## 2014-09-17 DIAGNOSIS — M791 Myalgia: Secondary | ICD-10-CM | POA: Diagnosis not present

## 2014-09-17 DIAGNOSIS — Z8619 Personal history of other infectious and parasitic diseases: Secondary | ICD-10-CM | POA: Insufficient documentation

## 2014-09-17 DIAGNOSIS — J45909 Unspecified asthma, uncomplicated: Secondary | ICD-10-CM | POA: Diagnosis not present

## 2014-09-17 DIAGNOSIS — M79643 Pain in unspecified hand: Secondary | ICD-10-CM

## 2014-09-17 DIAGNOSIS — M778 Other enthesopathies, not elsewhere classified: Secondary | ICD-10-CM

## 2014-09-17 MED ORDER — NAPROXEN 500 MG PO TABS: 500.0000 mg | ORAL_TABLET | Freq: Two times a day (BID) | ORAL | Status: DC

## 2014-09-17 NOTE — ED Provider Notes (Signed)
CSN: [id number]     Arrival date & time 09/17/14  1957 History  This chart was scribed for non-physician provider Sharilyn SitesLisa Sanders, PA-C, working with Gerhard Munchobert Lockwood, MD by Phillis HaggisGabriella Gaje, ED Scribe. This patient was seen in room WTR8/WTR8 and patient care was started at 9:09 PM.    Chief Complaint  Patient presents with  . Hand Pain   Patient is a 24 y.o. female presenting with hand pain. The history is provided by the patient. No language interpreter was used.  Hand Pain    HPI Comments: Joy Hartman is a 24 y.o. female who presents to the Emergency Department complaining of left hand pain onset two days ago. She states that she does not recall any injury to cause the pain but noticed the pain and swelling. She states that she is left handed. 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 She states that her pain is worsened with movement and the pain radiates to her wrist when she lifts heavy items.  Denies any numbness, weakness, or paresthesias of left hand. No intervention tried prior to arrival.   Past Medical History  Diagnosis Date  . Panic attack   . Chlamydia   . Gonorrhea   . Trichomonas   . Asthma    History reviewed. No pertinent past surgical history. Family History  Problem Relation Age of Onset  . Asthma Mother   . Hypertension Mother   . Hypertension Father   . Hypertension Maternal Grandmother   . Hypertension Maternal Grandfather   . Hypertension Paternal Grandmother   . Hypertension Paternal Grandfather    History  Substance Use Topics  . Smoking status: Never Smoker   . Smokeless tobacco: Not on file  . Alcohol Use: No   OB History    Gravida Para Term Preterm AB TAB SAB Ectopic Multiple Living   1 1 1  0 0 0 0 0 0 1     Review of Systems  Musculoskeletal: Positive for myalgias and arthralgias.  All other systems reviewed and are negative.  Allergies  Hydrocodone  Home Medications   Prior to Admission medications    Medication Sig Start Date End Date Taking? Authorizing Provider  albuterol (PROVENTIL HFA;VENTOLIN HFA) 108 (90 BASE) MCG/ACT inhaler Inhale 2 puffs into the lungs every 6 (six) hours as needed for wheezing or shortness of breath. 03/30/14   Ardith Darkaleb M Parker, MD  ibuprofen (ADVIL,MOTRIN) 200 MG tablet Take 200 mg by mouth every 6 (six) hours as needed for headache.    Historical Provider, MD  loratadine (CLARITIN) 10 MG tablet Take 1 tablet (10 mg total) by mouth daily. 08/17/13   Antony MaduraKelly Humes, PA-C  prochlorperazine (COMPAZINE) 10 MG tablet Take 1 tablet (10 mg total) by mouth 2 (two) times daily as needed for nausea or vomiting (Nausea ). 03/05/14   Junious SilkHannah Merrell, PA-C   BP 125/68 mmHg  Pulse 88  Temp(Src) 98.7 F (37.1 C) (Oral)  Resp 15  Ht 5\' 6"  (1.676 m)  Wt 201 lb 9.6 oz (91.445 kg)  BMI 32.55 kg/m2  SpO2 99%   Physical Exam  Constitutional: She is oriented to person, place, and time. She appears well-developed and well-nourished. No distress.  HENT:  Head: Normocephalic and atraumatic.  Mouth/Throat: Oropharynx is clear and moist.  Eyes: Conjunctivae and EOM are normal. Pupils are equal, round, and reactive to light.  Neck: Normal range of motion.  Cardiovascular: Normal rate, regular rhythm and normal heart  sounds.   Pulmonary/Chest: Effort normal and breath sounds normal. No respiratory distress. She has no wheezes.  Musculoskeletal: Normal range of motion.       Left hand: She exhibits tenderness, bony tenderness and swelling. She exhibits normal range of motion, normal capillary refill, no deformity and no laceration. Normal sensation noted. Normal strength noted.       Hands: Left hand with mild swelling along dorsal aspect, more pronounced over first and second metacarpals; no bony deformity noted; full ROM maintained, some pain with full flexion of fingers; + finkelstein's test; negative tinel's and phalen's test; strong radial pulse and cap refill; sensation intact throughout  hand  Neurological: She is alert and oriented to person, place, and time.  Skin: Skin is warm and dry. She is not diaphoretic.  Psychiatric: She has a normal mood and affect.  Nursing note and vitals reviewed.   ED Course  Procedures (including critical care time) DIAGNOSTIC STUDIES: Oxygen Saturation is 99% on room air, normal by my interpretation.    COORDINATION OF CARE: 9:11 PM-Discussed treatment plan which includes hand brace and anti-inflammatories with pt at bedside and pt agreed to plan.   Labs Review Labs Reviewed - No data to display  Imaging Review Dg Hand Complete Left  09/17/2014   CLINICAL DATA:  Acute left hand swelling in the region of the proximal index and middle fingers and associated metacarpal regions.  EXAM: LEFT HAND - COMPLETE 3+ VIEW  COMPARISON:  None.  FINDINGS: Mild dorsal soft tissue swelling at the level of the distal metacarpals. Normal appearing bones.  IMPRESSION: Mild dorsal soft tissue swelling without underlying bony abnormality.   Electronically Signed   By: Beckie Salts M.D.   On: 09/17/2014 21:01    EKG Interpretation None      MDM   Final diagnoses:  Hand pain, left  Left hand tendonitis   24 year old female with likely tendinitis of her left hand. 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 On exam, she has mild swelling of her left dorsal hand with tenderness along radial aspect of wrist. She has a positive Finkelstein's test. Hand remains neurovascularly intact. X-ray negative for acute findings. There are no signs of septic joint. Patient will be discharged home with supportive care. Wrist splint placed here in the ED for comfort, Naprosyn for pain and inflammation. FU with hand surgery given if no improvement in the next week or if symptoms worsen.  Patient placed on light duty without heavy lifting for one week.  Discussed plan with patient, he/she acknowledged understanding and  agreed with plan of care.  Return precautions given for new or worsening symptoms.  I personally performed the services described in this documentation, which was scribed in my presence. The recorded information has been reviewed and is accurate.   Garlon Hatchet, PA-C 09/17/14 2213  Gerhard Munch, MD 09/18/14 [phone]

## 2014-09-17 NOTE — Discharge Instructions (Signed)
Take the prescribed medication as directed. Follow-up with hand specialist if no improvement in the next week or if symptoms worsen-- call to schedule appt if needed. Return to the ED for new or worsening symptoms.

## 2014-09-17 NOTE — ED Notes (Signed)
Pt complains of left hand pain and swelling for the past two days.  She was washing dishes at work and her hand started hurting, but she denies any injury.  Pain is rated 8/10 and described as burning.  She has not taken any medication to treat pain or swelling.  Pt has full range of motion in hand but states that it is painful and tingles in her fingertips when she flexes her fingers.

## 2014-09-22 ENCOUNTER — Ambulatory Visit (INDEPENDENT_AMBULATORY_CARE_PROVIDER_SITE_OTHER): Payer: Medicaid Other | Admitting: Family Medicine

## 2014-09-22 ENCOUNTER — Encounter: Payer: Self-pay | Admitting: Family Medicine

## 2014-09-22 VITALS — BP 122/76 | HR 92 | Temp 98.8°F | Ht 66.0 in | Wt 198.6 lb

## 2014-09-22 DIAGNOSIS — M25539 Pain in unspecified wrist: Secondary | ICD-10-CM | POA: Insufficient documentation

## 2014-09-22 DIAGNOSIS — M25532 Pain in left wrist: Secondary | ICD-10-CM

## 2014-09-22 LAB — RHEUMATOID FACTOR

## 2014-09-22 MED ORDER — DICLOFENAC SODIUM 75 MG PO TBEC: 75.0000 mg | DELAYED_RELEASE_TABLET | Freq: Two times a day (BID) | ORAL | Status: DC

## 2014-09-22 NOTE — Assessment & Plan Note (Signed)
Continue to trial conservative measures and check ANA and RF given age and symptoms.

## 2014-09-22 NOTE — Patient Instructions (Signed)
Wrist Exercises RANGE OF MOTION (ROM) AND STRETCHING EXERCISES - Wrist Sprain  These exercises may help you when beginning to rehabilitate your injury. Your symptoms may resolve with or without further involvement from your physician, physical therapist, or athletic trainer. While completing these exercises, remember:   Restoring tissue flexibility helps normal motion to return to the joints. This allows healthier, less painful movement and activity.  An effective stretch should be held for at least 30 seconds.  A stretch should never be painful. You should only feel a gentle lengthening or release in the stretched tissue. RANGE OF MOTION - Wrist Flexion, Active-Assisted  Extend your right / left elbow with your palm pointing down.*  Gently pull the back of your hand toward you until you feel a gentle stretch on the top of your forearm.  Hold this position for _____30_____ seconds. Repeat ____3______ times. Complete this exercise ____3______ times per day.  *If directed by your physician, physical therapist, or athletic trainer, complete this stretch with your elbow bent rather than extended. RANGE OF MOTION - Wrist Extension, Active-Assisted   Extend your right / left elbow and turn your palm upward.*  Gently pull your palm/fingertips back so your wrist extends and your fingers point more toward the ground.  You should feel a gentle stretch on the inside of your forearm.  Hold this position for _______30___ seconds. Repeat _______3___ times. Complete this exercise ______3____ times per day. *If directed by your physician, physical therapist, or athletic trainer, complete this stretch with your elbow bent, rather than extended. RANGE OF MOTION - Supination, Active   Stand or sit with your elbows at your side. Bend your right / left elbow to 90 degrees.  Turn your palm upward until you feel a gentle stretch on the inside of your forearm.  Hold this position for _____30_____ seconds.  Slowly release and return to the starting position. Repeat ______3____ times. Complete this stretch _______3___ times per day.  RANGE OF MOTION - Pronation, Active   Stand or sit with your elbows at your side. Bend your right / left elbow to 90 degrees.  Turn your palm downward until you feel a gentle stretch on the top of your forearm.  Hold this position for ____30______ seconds. Slowly release and return to the starting position. Repeat ______3____ times. Complete this stretch _____3_____ times per day.  STRENGTHENING EXERCISES  These exercises may help you when beginning to rehabilitate your injury. They may resolve your symptoms with or without further involvement from your physician, physical therapist, or athletic trainer. While completing these exercises, remember:   Muscles can gain both the endurance and the strength needed for everyday activities through controlled exercises.  Complete these exercises as instructed by your physician, physical therapist, or athletic trainer. Progress the resistance and repetitions only as guided.  You may experience muscle soreness or fatigue, but the pain or discomfort you are trying to eliminate should never worsen during these exercises. If this pain does worsen, stop and make certain you are following the directions exactly. If the pain is still present after adjustments, discontinue the exercise until you can discuss the trouble with your clinician. STRENGTH - Wrist Flexors  Sit with your right / left forearm palm-up and fully supported. Your elbow should be resting below the height of your shoulder. Allow your wrist to extend over the edge of the surface.  Loosely holding a ______2 lb____ weight or a piece of rubber exercise band/tubing, slowly curl your hand up toward your  forearm.  Hold this position for ______30____ seconds. Slowly lower the wrist back to the starting position in a controlled manner. Repeat ____3______ times. Complete this  exercise ____3______ times per day.  STRENGTH - Wrist Extensors  Sit with your right / left forearm palm-down and fully supported. Your elbow should be resting below the height of your shoulder. Allow your wrist to extend over the edge of the surface.  Loosely holding a ___2 lb_______ weight or a piece of rubber exercise band/tubing, slowly curl your hand up toward your forearm.  Hold this position for _____30_____ seconds. Slowly lower the wrist back to the starting position in a controlled manner. Repeat ______3____ times. Complete this exercise _____3_____ times per day.  STRENGTH - Forearm Supinators  Sit with your right / left forearm supported on a table, keeping your elbow below shoulder height. Rest your hand over the edge, palm down.  Gently grip a hammer or a soup ladle.  Without moving your elbow, slowly turn your palm and hand upward to a "thumbs-up" position.  Hold this position for ____30______ seconds. Slowly return to the starting position. Repeat ______3____ times. Complete this exercise ____3______ times per day.  STRENGTH - Forearm Pronators   Sit with your right / left forearm supported on a table, keeping your elbow below shoulder height. Rest your hand over the edge, palm up.  Gently grip a hammer or a soup ladle.  Without moving your elbow, slowly turn your palm and hand upward to a "thumbs-up" position.  Hold this position for _____30_____ seconds. Slowly return to the starting position. Repeat ______3____ times. Complete this exercise ______3____ times per day.  STRENGTH - Grip  Grasp a tennis ball, a dense sponge, or a large, rolled sock in your hand.  Squeeze as hard as you can without increasing any pain.  Hold this position for ____30______ seconds. Release your grip slowly. Repeat ___3_______ times. Complete this exercise ______3____ times per day.  Document Released: 03/29/2005 Document Revised: 09/29/2013 Document Reviewed: 08/27/2008 [facility]  Patient Information 2015 [employer] [place]. This information is not intended to replace advice given to you by your health care provider. Make sure you discuss any questions you have with your health care provider.

## 2014-09-22 NOTE — Progress Notes (Signed)
    Subjective:    Patient ID: Joy Hartman is a 24 y.o. female presenting with Follow-up  on 09/22/2014  HPI: Got hand pain and swelling approx. 1 wk ago.  Picking up friers and flipping toast. Works at [employer].  Has been working there 1 month. 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 Injured this same part of her hand roller skating and did PT in the past.  This injury feels similar. Pain is worse in thumb and wrist and is worse with lifting. Has been on Naproxen x 1 wk with no improvement in pain or symptoms.  Review of Systems  Constitutional: Negative for fever and chills.  Respiratory: Negative for shortness of breath.   Cardiovascular: Negative for chest pain.  Gastrointestinal: Negative for nausea, vomiting and abdominal pain.  Genitourinary: Negative for dysuria.  Skin: Negative for rash.      Objective:    BP 122/76 mmHg  Pulse 92  Temp(Src) 98.8 F (37.1 C) (Oral)  Ht 5\' 6"  (1.676 m)  Wt 198 lb 9 oz (90.067 kg)  BMI 32.06 kg/m2 Physical Exam  Constitutional: She is oriented to person, place, and time. She appears well-developed and well-nourished. No distress.  HENT:  Head: Normocephalic and atraumatic.  Eyes: No scleral icterus.  Neck: Neck supple.  Cardiovascular: Normal rate.   Pulmonary/Chest: Effort normal.  Abdominal: Soft.  Musculoskeletal: She exhibits edema and tenderness.       Hands: Neurological: She is alert and oriented to person, place, and time.  Skin: Skin is warm and dry.  Psychiatric: She has a normal mood and affect.        Assessment & Plan:   Problem List Items Addressed This Visit      Unprioritized   Wrist pain, acute - Primary    Continue to trial conservative measures and check ANA and RF given age and symptoms.      Relevant Medications   diclofenac (VOLTAREN) 75 MG EC tablet   Other Relevant Orders   ANA   Rheumatoid factor       PRATT,TANYA S 09/22/2014 3:04 PM

## 2014-09-23 LAB — ANA: Anti Nuclear Antibody(ANA): NEGATIVE

## 2014-09-29 ENCOUNTER — Telehealth: Payer: Self-pay | Admitting: Family Medicine

## 2014-09-29 MED ORDER — MELOXICAM 15 MG PO TABS: 15.0000 mg | ORAL_TABLET | Freq: Every day | ORAL | Status: DC

## 2014-09-29 NOTE — Telephone Encounter (Signed)
Will forward to Dr. Shawnie PonsPratt who saw patient for this concern. Marye Eagen,CMA

## 2014-09-29 NOTE — Telephone Encounter (Signed)
Patient is aware that a new medication has been sent to the pharmacy. She will check with them to see if it is covered. Joy Hartman,CMA

## 2014-09-29 NOTE — Telephone Encounter (Signed)
Diclofenac is not covered by Medicaid Would like something else Returned to work yesterday and the swelling has returned Please advise

## 2014-11-09 ENCOUNTER — Ambulatory Visit (INDEPENDENT_AMBULATORY_CARE_PROVIDER_SITE_OTHER): Payer: Medicaid Other | Admitting: Family Medicine

## 2014-11-09 ENCOUNTER — Encounter: Payer: Self-pay | Admitting: Family Medicine

## 2014-11-09 VITALS — BP 127/78 | HR 80 | Temp 98.7°F | Wt 197.0 lb

## 2014-11-09 DIAGNOSIS — M7989 Other specified soft tissue disorders: Secondary | ICD-10-CM | POA: Insufficient documentation

## 2014-11-09 MED ORDER — TRIAMCINOLONE ACETONIDE 0.1 % EX OINT: 1.0000 "application " | TOPICAL_OINTMENT | Freq: Two times a day (BID) | CUTANEOUS | Status: DC

## 2014-11-09 NOTE — Patient Instructions (Signed)
It was nice to see you today.  Use the topical ointment twice daily and follow up closely with your primary physician.  Take care  Dr. Adriana Simas

## 2014-11-09 NOTE — Progress Notes (Signed)
I was available as preceptor to resident for this patient's office visit.  

## 2014-11-09 NOTE — Assessment & Plan Note (Signed)
Patient reporting bilateral hand swelling as well as redness and itching. Normal exam today. Unclear etiology at this point but is likely contact versus inflammatory. Treating with topical triamcinolone. Patient to follow closely with PCP.

## 2014-11-09 NOTE — Progress Notes (Signed)
   Subjective:    Patient ID: Joy Hartman, female    DOB: June 10, 1990, 24 y.o.   MRN: [id number]  HPI 24 year old female presents for same day appointment complaints of rash.  1) Rash  Patient reports that she has had bilateral redness of the hands intermittently for the past 3 weeks.  She reports associated itching, swelling, and discomfort.  No new exposures. No recent bug bites or tick bites.  No other known inciting factors.  No fevers or chills. No other associated symptoms.  She has taken Advil for the discomfort.  No relieving factors.  No known exacerbating factors.  Review of Systems  Constitutional: Negative for fever and chills.  Respiratory: Negative for shortness of breath.   Skin: Positive for rash.      Objective:   Physical Exam Filed Vitals:   11/09/14 1449  BP: 127/78  Pulse: 80  Temp: 98.7 F (37.1 C)   Vital signs reviewed.  Exam: General: well appearing, NAD. Skin: Hands normal in appearance. No current rash or erythema. 2+ pulses.       Assessment & Plan:  See problem list.

## 2015-05-27 ENCOUNTER — Encounter (HOSPITAL_COMMUNITY): Payer: Self-pay | Admitting: Emergency Medicine

## 2015-05-27 ENCOUNTER — Emergency Department (HOSPITAL_COMMUNITY)
Admission: EM | Admit: 2015-05-27 | Discharge: 2015-05-27 | Disposition: A | Discharge status: Home / Self Care | Payer: Medicaid Other | Source: Home / Self Care

## 2015-05-27 DIAGNOSIS — J069 Acute upper respiratory infection, unspecified: Secondary | ICD-10-CM

## 2015-05-27 MED ORDER — AMOXICILLIN 500 MG PO CAPS: 500.0000 mg | ORAL_CAPSULE | Freq: Three times a day (TID) | ORAL | Status: DC

## 2015-05-27 MED ORDER — ALBUTEROL SULFATE HFA 108 (90 BASE) MCG/ACT IN AERS: 2.0000 | INHALATION_SPRAY | RESPIRATORY_TRACT | Status: DC | PRN

## 2015-05-27 NOTE — ED Notes (Signed)
[facility] a [facility] of a headache and nasal congestion x 3 days. She stated that she has tried OTC medicines with minimal relief.

## 2015-05-27 NOTE — ED Provider Notes (Signed)
CSN: [id number]     Arrival date & time 05/27/15  1342 History   None    Chief Complaint  Patient presents with  . Headache  . Nasal Congestion   (Consider location/radiation/quality/duration/timing/severity/associated sxs/prior Treatment) HPI History obtained from patient:   LOCATION:upper resp SEVERITY: DURATION:3 days CONTEXT:sudden onset, exposed to son and boyfriend with similar symptoms QUALITY: MODIFYING FACTORS:OTC meds and inhlaer ASSOCIATED SYMPTOMS: wheezing TIMING:episodic OCCUPATION:  Past Medical History  Diagnosis Date  . Panic attack   . Chlamydia   . Gonorrhea   . Trichomonas   . Asthma    History reviewed. No pertinent past surgical history. Family History  Problem Relation Age of Onset  . Asthma Mother   . Hypertension Mother   . Hypertension Father   . Hypertension Maternal Grandmother   . Hypertension Maternal Grandfather   . Hypertension Paternal Grandmother   . Hypertension Paternal Grandfather    Social History  Substance Use Topics  . Smoking status: Never Smoker   . Smokeless tobacco: None  . Alcohol Use: No   OB History    Gravida Para Term Preterm AB TAB SAB Ectopic Multiple Living   0 0 0 0 0 0 1     Review of Systems ROS +'ve congestion  Denies: HEADACHE, NAUSEA, ABDOMINAL PAIN, CHEST PAIN, CONGESTION, DYSURIA, SHORTNESS OF BREATH  Allergies  Hydrocodone  Home Medications   Prior to Admission medications   Medication Sig Start Date End Date Taking? Authorizing Provider  albuterol (PROVENTIL HFA;VENTOLIN HFA) 108 (90 BASE) MCG/ACT inhaler Inhale 2 puffs into the lungs every 6 (six) hours as needed for wheezing or shortness of breath. 03/30/14   Ardith Dark, MD  loratadine (CLARITIN) 10 MG tablet Take 1 tablet (10 mg total) by mouth daily. Patient not taking: Reported on 09/17/2014 08/17/13   Antony Madura, PA-C  meloxicam (MOBIC) 15 MG tablet Take 1 tablet (15 mg total) by mouth daily. 09/29/14   Reva Bores, MD   naproxen (NAPROSYN) 500 MG tablet Take 1 tablet (500 mg total) by mouth 2 (two) times daily with a meal. 09/17/14   Garlon Hatchet, PA-C  prochlorperazine (COMPAZINE) 10 MG tablet Take 1 tablet (10 mg total) by mouth 2 (two) times daily as needed for nausea or vomiting (Nausea ). Patient not taking: Reported on 09/17/2014 03/05/14   Junious Silk, PA-C  triamcinolone ointment (KENALOG) 0.1 % Apply 1 application topically 2 (two) times daily. 11/09/14   Tommie Sams, DO   Meds Ordered and Administered this Visit  Medications - No data to display  BP 122/82 mmHg  Pulse 95  Temp(Src) 98.5 F (36.9 C) (Oral)  Resp 20  SpO2 100% No data found.   Physical Exam NURSES NOTES AND VITAL SIGNS REVIEWED. CONSTITUTIONAL: Well developed, well nourished, no acute distress HEENT: normocephalic, atraumatic EYES: Conjunctiva normal NECK:normal ROM, supple PULMONARY:No respiratory distress, normal effort, Lungs: CTAb/l CARDIOVASCULAR: RRR, no murmur ABDOMEN: soft, ND, NT, +'ve BS MUSCULOSKELETAL: Normal ROM of all extremities SKIN: warm and dry without rash PSYCHIATRIC: Mood and affect normal  ED Course  Procedures (including critical care time)  Labs Review Labs Reviewed - No data to display  Imaging Review No results found.   Visual Acuity Review  Right Eye Distance:   Left Eye Distance:   Bilateral Distance:    Right Eye Near:   Left Eye Near:    Bilateral Near:         MDM   1. URI (  upper respiratory infection)    Patient is advised to continue home symptomatic treatment. Prescription for  Albuterol and amoxil sent pharmacy patient has indicated. Patient is advised that if there are new or worsening symptoms or attend the emergency department, or contact primary care provider. Instructions of care provided discharged home in stable condition.  THIS NOTE WAS GENERATED USING A VOICE RECOGNITION SOFTWARE PROGRAM. ALL REASONABLE EFFORTS  WERE MADE TO PROOFREAD THIS DOCUMENT  FOR ACCURACY.     Tharon AquasFrank C Syrena Burges, PA 05/27/15 [phone]

## 2015-05-27 NOTE — Discharge Instructions (Signed)
Upper Respiratory Infection, Adult Most upper respiratory infections (URIs) are a viral infection of the air passages leading to the lungs. A URI affects the nose, throat, and upper air passages. The most common type of URI is nasopharyngitis and is typically referred to as "the common cold." URIs run their course and usually go away on their own. Most of the time, a URI does not require medical attention, but sometimes a bacterial infection in the upper airways can follow a viral infection. This is called a secondary infection. Sinus and middle ear infections are common types of secondary upper respiratory infections. Bacterial pneumonia can also complicate a URI. A URI can worsen asthma and chronic obstructive pulmonary disease (COPD). Sometimes, these complications can require emergency medical care and may be life threatening.  CAUSES Almost all URIs are caused by viruses. A virus is a type of germ and can spread from one person to another.  RISKS FACTORS You may be at risk for a URI if:   You smoke.   You have chronic heart or lung disease.  You have a weakened defense (immune) system.   You are very young or very old.   You have nasal allergies or asthma.  You work in crowded or poorly ventilated areas.  You work in health care facilities or schools. SIGNS AND SYMPTOMS  Symptoms typically develop 2-3 days after you come in contact with a cold virus. Most viral URIs last 7-10 days. However, viral URIs from the influenza virus (flu virus) can last 14-18 days and are typically more severe. Symptoms may include:   Runny or stuffy (congested) nose.   Sneezing.   Cough.   Sore throat.   Headache.   Fatigue.   Fever.   Loss of appetite.   Pain in your forehead, behind your eyes, and over your cheekbones (sinus pain).  Muscle aches.  DIAGNOSIS  Your health care provider may diagnose a URI by:  Physical exam.  Tests to check that your symptoms are not due to  another condition such as:  Strep throat.  Sinusitis.  Pneumonia.  Asthma. TREATMENT  A URI goes away on its own with time. It cannot be cured with medicines, but medicines may be prescribed or recommended to relieve symptoms. Medicines may help:  Reduce your fever.  Reduce your cough.  Relieve nasal congestion. HOME CARE INSTRUCTIONS   Take medicines only as directed by your health care provider.   Gargle warm saltwater or take cough drops to comfort your throat as directed by your health care provider.  Use a warm mist humidifier or inhale steam from a shower to increase air moisture. This may make it easier to breathe.  Drink enough fluid to keep your urine clear or pale yellow.   Eat soups and other clear broths and maintain good nutrition.   Rest as needed.   Return to work when your temperature has returned to normal or as your health care provider advises. You may need to stay home longer to avoid infecting others. You can also use a face mask and careful hand washing to prevent spread of the virus.  Increase the usage of your inhaler if you have asthma.   Do not use any tobacco products, including cigarettes, chewing tobacco, or electronic cigarettes. If you need help quitting, ask your health care provider. PREVENTION  The best way to protect yourself from getting a cold is to practice good hygiene.   Avoid oral or hand contact with people with cold   symptoms.   Wash your hands often if contact occurs.  There is no clear evidence that vitamin C, vitamin E, echinacea, or exercise reduces the chance of developing a cold. However, it is always recommended to get plenty of rest, exercise, and practice good nutrition.  SEEK MEDICAL CARE IF:   You are getting worse rather than better.   Your symptoms are not controlled by medicine.   You have chills.  You have worsening shortness of breath.  You have brown or red mucus.  You have yellow or brown nasal  discharge.  You have pain in your face, especially when you bend forward.  You have a fever.  You have swollen neck glands.  You have pain while swallowing.  You have white areas in the back of your throat. SEEK IMMEDIATE MEDICAL CARE IF:   You have severe or persistent:  Headache.  Ear pain.  Sinus pain.  Chest pain.  You have chronic lung disease and any of the following:  Wheezing.  Prolonged cough.  Coughing up blood.  A change in your usual mucus.  You have a stiff neck.  You have changes in your:  Vision.  Hearing.  Thinking.  Mood. MAKE SURE YOU:   Understand these instructions.  Will watch your condition.  Will get help right away if you are not doing well or get worse.   This information is not intended to replace advice given to you by your health care provider. Make sure you discuss any questions you have with your health care provider.   Document Released: 11/08/2000 Document Revised: 09/29/2014 Document Reviewed: 08/20/2013 Elsevier Interactive Patient Education 2016 [employer]  

## 2015-12-09 ENCOUNTER — Encounter: Payer: Self-pay | Admitting: Family Medicine

## 2015-12-09 ENCOUNTER — Ambulatory Visit (INDEPENDENT_AMBULATORY_CARE_PROVIDER_SITE_OTHER): Payer: Medicaid Other | Admitting: Family Medicine

## 2015-12-09 VITALS — BP 131/81 | HR 82 | Temp 98.4°F | Ht 66.0 in | Wt 205.2 lb

## 2015-12-09 DIAGNOSIS — J309 Allergic rhinitis, unspecified: Secondary | ICD-10-CM

## 2015-12-09 MED ORDER — FLUTICASONE PROPIONATE 50 MCG/ACT NA SUSP: 2.0000 | Freq: Every day | NASAL | Status: DC

## 2015-12-09 MED ORDER — CETIRIZINE HCL 5 MG PO TABS: 5.0000 mg | ORAL_TABLET | Freq: Every day | ORAL | Status: DC

## 2015-12-09 NOTE — Patient Instructions (Signed)
I have prescribed cetirizine to help with drying up fluid. I have prescribed a nasal spray to help with the nasal congestion. If your hearing does not improve, follow up with us.

## 2015-12-09 NOTE — Progress Notes (Signed)
Subjective: CC: Occult he hearing out of right ear  HPI: Patient is a 25 y.o. female with a past medical history of asthma presenting to clinic today for a same-day appointment.  The patient noted pain behind her R ear on Monday, 11/29/15. She notes that starting on Thursday, she started having difficulty hearing out of the right ear. She notes that sounds are muffled. [place] the [place] this began. She started having having headaches and dizziness as well. She feels like the room is spinning.  No ear drainage. She's had a nonproductive cough and nasal congestion x 1 month. No fevers or chills. She endorses sneezing, wartery eyes and itchy eyes as well.   Her headache is diffuse and feels like pressure, it is most noticeable behind the eyes. She denies vision change or photophobia. No unilateral weakness or paresthesias   No SOB, chest pain, no rash. She denies putting her head under the water. She denies any trauma.   Social History: non-smoker  ROS: All other systems reviewed and are negative.  Past Medical History Patient Active Problem List   Diagnosis Date Noted  . Bilateral hand swelling 11/09/2014  . Panic attacks 03/30/2014  . Elevated random blood glucose level 03/30/2014  . Healthcare maintenance 03/30/2014  . Asthma 07/13/2010  . HEADACHE 07/13/2010    Medications- reviewed and updated Current Outpatient Prescriptions  Medication Sig Dispense Refill  . albuterol (PROVENTIL HFA;VENTOLIN HFA) 108 (90 BASE) MCG/ACT inhaler Inhale 2 puffs into the lungs every 6 (six) hours as needed for wheezing or shortness of breath. 1 Inhaler 2  . albuterol (PROVENTIL HFA;VENTOLIN HFA) 108 (90 Base) MCG/ACT inhaler Inhale 2 puffs into the lungs every 4 (four) hours as needed for wheezing or shortness of breath. 1 Inhaler 0  . amoxicillin (AMOXIL) 500 MG capsule Take 1 capsule (500 mg total) by mouth 3 (three) times daily. 21 capsule 0  . loratadine (CLARITIN) 10 MG tablet Take 1  tablet (10 mg total) by mouth daily. (Patient not taking: Reported on 09/17/2014) 30 tablet 0  . meloxicam (MOBIC) 15 MG tablet Take 1 tablet (15 mg total) by mouth daily. 15 tablet 0  . naproxen (NAPROSYN) 500 MG tablet Take 1 tablet (500 mg total) by mouth 2 (two) times daily with a meal. 30 tablet 0  . prochlorperazine (COMPAZINE) 10 MG tablet Take 1 tablet (10 mg total) by mouth 2 (two) times daily as needed for nausea or vomiting (Nausea ). (Patient not taking: Reported on 09/17/2014) 10 tablet 0  . triamcinolone ointment (KENALOG) 0.1 % Apply 1 application topically 2 (two) times daily. 30 g 0  . [DISCONTINUED] fexofenadine (ALLEGRA) 180 MG tablet Take 180 mg by mouth daily as needed for allergies or rhinitis.     No current facility-administered medications for this visit.    Objective: Office vital signs reviewed. BP 131/81 mmHg  Pulse 82  Temp(Src) 98.4 F (36.9 C) (Oral)  Ht 5\' 6"  (1.676 m)  Wt 205 lb 3.2 oz (93.078 kg)  BMI 33.14 kg/m2  LMP 10/09/2015   Physical Examination:  General: Awake, alert, well- nourished, NAD ENMT: Left TM intact, normal light reflex, no erythema, no bulging.  Clear fluid noted behind the right TM without erythema or drainage..  Nasal turbinates moist. MMM, cobblestoning of the posterior oropharynx without tonsillar hypertrophy or exudate.  Eyes: Conjunctiva non-injected. PERRL.  Cardio: RRR, no m/r/g noted.  Pulm: No increased WOB.  CTAB, without wheezes, rhonchi or crackles noted.  Hearing Screening   Method: Audiometry           Right ear:   Fail Fail Pass Pass   Left ear:   Fail Fail Pass Pass   Comments: Tested 20,25 and 40 dBHL   Assessment/Plan: Allergic rhinitis:  The patient's symptoms of nasal congestion, cough, sneezing, watery eyes, itchy eyes, and discomfort behind the ear are all consistent with allergies. She been on loratadine previously but is no longer taking this medication. She does  not have any fevers or chills to suggest an infectious etiology, and she is well-appearing on exam. No evidence of OE or OM on my exam.  On hearing exam, she had issues hearing low frequency sounds. Will treat allergies to see if this may be contributing, the patient should have this followed up in the future and referral to audiology as needed.   Prescription for Zyrtec and Flonase. The patient should follow-up with her PCP.  Joanna Puff PGY-2, [facility]

## 2016-05-25 ENCOUNTER — Emergency Department (HOSPITAL_COMMUNITY)
Admission: EM | Admit: 2016-05-25 | Discharge: 2016-05-25 | Disposition: A | Discharge status: Home / Self Care | Payer: Medicaid Other | Attending: Emergency Medicine | Admitting: Emergency Medicine

## 2016-05-25 ENCOUNTER — Encounter (HOSPITAL_COMMUNITY): Payer: Self-pay | Admitting: Emergency Medicine

## 2016-05-25 DIAGNOSIS — J45909 Unspecified asthma, uncomplicated: Secondary | ICD-10-CM | POA: Insufficient documentation

## 2016-05-25 DIAGNOSIS — K0889 Other specified disorders of teeth and supporting structures: Secondary | ICD-10-CM | POA: Diagnosis present

## 2016-05-25 DIAGNOSIS — Z79899 Other long term (current) drug therapy: Secondary | ICD-10-CM | POA: Diagnosis not present

## 2016-05-25 MED ORDER — NAPROXEN 375 MG PO TABS: 375.0000 mg | ORAL_TABLET | Freq: Two times a day (BID) | ORAL | 0 refills | Status: DC

## 2016-05-25 MED ORDER — PENICILLIN V POTASSIUM 500 MG PO TABS: 500.0000 mg | ORAL_TABLET | Freq: Three times a day (TID) | ORAL | 0 refills | Status: DC

## 2016-05-25 NOTE — ED Triage Notes (Signed)
Pt here for dental pain and has swelling to left side of face.

## 2016-05-25 NOTE — ED Provider Notes (Signed)
[facility]-EMERGENCY DEPT Provider Note   CSN: [id number] Arrival date & time: 05/25/16  1518  By signing my name below, I, Joy Hartman, attest that this documentation has been prepared under the direction and in the presence of Joy Mornavid Deara Bober, NP-C. Electronically Signed: Orpah CobbMaurice Hartman , ED Scribe. 05/25/16. 4:41 PM.    History   Chief Complaint Chief Complaint  Patient presents with  . Dental Pain    HPI  HPI Comments: Joy Hartman is a 25 y.o. female who presents to the Emergency Department complaining of mild, chronic L lower dental pain with sudden onset x1 month. Pt states that over the past month the dental pain has worsened. Pt reports a fever. She denies taking anything for the pain. She denies having a dentist.    The history is provided by the patient. No language interpreter was used.  Dental Pain   This is a chronic problem. The current episode started more than 1 week ago. The problem occurs constantly. The problem has not changed since onset.The pain is mild. She has tried nothing for the symptoms.    Past Medical History:  Diagnosis Date  . Asthma   . Chlamydia   . Gonorrhea   . Panic attack   . Trichomonas     Patient Active Problem List   Diagnosis Date Noted  . Bilateral hand swelling 11/09/2014  . Panic attacks 03/30/2014  . Elevated random blood glucose level 03/30/2014  . Healthcare maintenance 03/30/2014  . Asthma 07/13/2010  . HEADACHE 07/13/2010    History reviewed. No pertinent surgical history.  OB History    Gravida Para Term Preterm AB Living   1 1 1  0 0 1   SAB TAB Ectopic Multiple Live Births   0 0 0 0 1       Home Medications    Prior to Admission medications   Medication Sig Start Date End Date Taking? Authorizing Provider  albuterol (PROVENTIL HFA;VENTOLIN HFA) 108 (90 BASE) MCG/ACT inhaler Inhale 2 puffs into the lungs every 6 (six) hours as needed for wheezing or shortness of breath. 03/30/14   Ardith Darkaleb M Parker, MD    albuterol (PROVENTIL HFA;VENTOLIN HFA) 108 (90 Base) MCG/ACT inhaler Inhale 2 puffs into the lungs every 4 (four) hours as needed for wheezing or shortness of breath. 05/27/15   Tharon AquasFrank C Patrick, PA  cetirizine (ZYRTEC) 5 MG tablet Take 1 tablet (5 mg total) by mouth daily. 12/09/15   Joanna Puffrystal S Dorsey, MD  fluticasone (FLONASE) 50 MCG/ACT nasal spray Place 2 sprays into both nostrils daily. 12/09/15   Joanna Puffrystal S Dorsey, MD  meloxicam (MOBIC) 15 MG tablet Take 1 tablet (15 mg total) by mouth daily. 09/29/14   Reva Boresanya S Pratt, MD  naproxen (NAPROSYN) 500 MG tablet Take 1 tablet (500 mg total) by mouth 2 (two) times daily with a meal. 09/17/14   Garlon HatchetLisa M Sanders, PA-C  triamcinolone ointment (KENALOG) 0.1 % Apply 1 application topically 2 (two) times daily. 11/09/14   Tommie SamsJayce G Cook, DO    Family History Family History  Problem Relation Age of Onset  . Asthma Mother   . Hypertension Mother   . Hypertension Father   . Hypertension Maternal Grandmother   . Hypertension Maternal Grandfather   . Hypertension Paternal Grandmother   . Hypertension Paternal Grandfather     Social History Social History  Substance Use Topics  . Smoking status: Never Smoker  . Smokeless tobacco: Not on file  . Alcohol use No  Allergies   Hydrocodone   Review of Systems Review of Systems  Constitutional: Positive for fever. Negative for chills.  HENT: Positive for dental problem. Negative for ear pain and sore throat.   Eyes: Negative for pain and visual disturbance.  Respiratory: Negative for cough and shortness of breath.   Cardiovascular: Negative for chest pain and palpitations.  Gastrointestinal: Negative for abdominal pain and vomiting.  Genitourinary: Negative for dysuria and hematuria.  Musculoskeletal: Negative for arthralgias and back pain.  Skin: Negative for color change and rash.  Neurological: Negative for seizures and syncope.  All other systems reviewed and are negative.    Physical  Exam Updated Vital Signs BP 130/81 (BP Location: Right Arm)   Pulse 73   Temp 98.2 F (36.8 C) (Oral)   Resp 16   Ht 5\' 6"  (1.676 m)   Wt 205 lb (93 kg)   SpO2 100%   BMI 33.09 kg/m   Physical Exam  Constitutional: She appears well-developed and well-nourished. No distress.  HENT:  Head: Normocephalic and atraumatic.  Mouth/Throat:    Eyes: Conjunctivae are normal.  Neck: Neck supple.  Cardiovascular: Normal rate and regular rhythm.   No murmur heard. Pulmonary/Chest: Effort normal and breath sounds normal. No respiratory distress.  Abdominal: Soft. There is no tenderness.  Musculoskeletal: She exhibits no edema.  Neurological: She is alert.  Skin: Skin is warm and dry.  Psychiatric: She has a normal mood and affect.  Nursing note and vitals reviewed.    ED Treatments / Results   DIAGNOSTIC STUDIES: Oxygen Saturation is 100% on RA, normal by my interpretation.   COORDINATION OF CARE: 4:41 PM-Discussed next steps with pt. Pt verbalized understanding and is agreeable with the plan.    Labs (all labs ordered are listed, but only abnormal results are displayed) Labs Reviewed - No data to display  EKG  EKG Interpretation None       Radiology No results found.  Procedures Procedures (including critical care time)  Medications Ordered in ED Medications - No data to display   Initial Impression / Assessment and Plan / ED Course  I have reviewed the triage vital signs and the nursing notes.  Pertinent labs & imaging results that were available during my care of the patient were reviewed by me and considered in my medical decision making (see chart for details).  Clinical Course   Patient with dentalgia.  No abscess requiring immediate incision and drainage.  Exam not concerning for Ludwig's angina or pharyngeal abscess.  Will treat with Pen VK and anti-inflammatory. Pt instructed to follow-up with dentist.  Discussed return precautions. Pt safe for  discharge.    Final Clinical Impressions(s) / ED Diagnoses   Final diagnoses:  Pain, dental    New Prescriptions Discharge Medication List as of 05/25/2016  4:52 PM    START taking these medications   Details  penicillin v potassium (VEETID) 500 MG tablet Take 1 tablet (500 mg total) by mouth 3 (three) times daily., Starting Thu 05/25/2016, Print       I personally performed the services described in this documentation, which was scribed in my presence. The recorded information has been reviewed and is accurate.     Joy Mornavid Shaniya Tashiro, NP 05/26/16 [id number]    Gwyneth SproutWhitney Plunkett, MD 05/27/16 0900

## 2016-08-28 ENCOUNTER — Ambulatory Visit: Payer: Medicaid Other | Admitting: *Deleted

## 2016-08-28 DIAGNOSIS — Z3200 Encounter for pregnancy test, result unknown: Secondary | ICD-10-CM

## 2016-08-28 LAB — POCT PREGNANCY, URINE: Preg Test, Ur: NEGATIVE

## 2016-08-29 NOTE — Progress Notes (Signed)
Patient presents to clinic for pregnancy test which is negative. Stated last period was 2/15. Noted to patient that if she were pregnant she would be around 6 weeks but her urine should be positive by now. Advised patient that if she does not start her period in a week she should return for another test. If at that point it is negative and still no menses, she would need to see a provider. Patient voiced understanding.

## 2017-06-03 ENCOUNTER — Other Ambulatory Visit: Payer: Self-pay

## 2017-06-03 ENCOUNTER — Encounter (HOSPITAL_COMMUNITY): Payer: Self-pay | Admitting: Emergency Medicine

## 2017-06-03 DIAGNOSIS — G43009 Migraine without aura, not intractable, without status migrainosus: Secondary | ICD-10-CM | POA: Insufficient documentation

## 2017-06-03 DIAGNOSIS — J45909 Unspecified asthma, uncomplicated: Secondary | ICD-10-CM | POA: Insufficient documentation

## 2017-06-03 DIAGNOSIS — Z79899 Other long term (current) drug therapy: Secondary | ICD-10-CM | POA: Insufficient documentation

## 2017-06-03 NOTE — ED Triage Notes (Signed)
Pt c/o HA onset 12/24. Pt states she feels as if the room is spinning when she closes her eyes. Pt states this HA is similar to ones in the past but longer duration.  Pt states pain is in front of head in the morning then rotates to back by evening.  Pt has not been see for same. Pt has tried Aleve without success.

## 2017-06-04 ENCOUNTER — Emergency Department (HOSPITAL_COMMUNITY)
Admission: EM | Admit: 2017-06-04 | Discharge: 2017-06-04 | Disposition: A | Discharge status: Home / Self Care | Payer: Self-pay | Attending: Emergency Medicine | Admitting: Emergency Medicine

## 2017-06-04 DIAGNOSIS — R42 Dizziness and giddiness: Secondary | ICD-10-CM

## 2017-06-04 DIAGNOSIS — G43009 Migraine without aura, not intractable, without status migrainosus: Secondary | ICD-10-CM

## 2017-06-04 LAB — POC URINE PREG, ED: Preg Test, Ur: NEGATIVE

## 2017-06-04 MED ORDER — MECLIZINE HCL 12.5 MG PO TABS: 12.5000 mg | ORAL_TABLET | Freq: Three times a day (TID) | ORAL | 0 refills | Status: DC | PRN

## 2017-06-04 MED ORDER — ALBUTEROL SULFATE HFA 108 (90 BASE) MCG/ACT IN AERS
1.0000 | INHALATION_SPRAY | Freq: Four times a day (QID) | RESPIRATORY_TRACT | 1 refills | Status: DC | PRN
Start: 2017-06-04 — End: 2020-04-11

## 2017-06-04 MED ORDER — DEXAMETHASONE SODIUM PHOSPHATE 10 MG/ML IJ SOLN: 10.0000 mg | Freq: Once | INTRAMUSCULAR | Status: DC

## 2017-06-04 MED ORDER — METOCLOPRAMIDE HCL 5 MG/ML IJ SOLN: 10.0000 mg | Freq: Once | INTRAMUSCULAR | Status: DC

## 2017-06-04 MED ORDER — SODIUM CHLORIDE 0.9 % IV BOLUS (SEPSIS)
1000.0000 mL | Freq: Once | INTRAVENOUS | Status: AC
  Administered 2017-06-04: 1000 mL via INTRAVENOUS

## 2017-06-04 MED ORDER — ALBUTEROL SULFATE HFA 108 (90 BASE) MCG/ACT IN AERS
1.0000 | INHALATION_SPRAY | Freq: Once | RESPIRATORY_TRACT | Status: AC
  Administered 2017-06-04: 1 via RESPIRATORY_TRACT
  Filled 2017-06-04: qty 6.7

## 2017-06-04 MED ORDER — METOCLOPRAMIDE HCL 5 MG/ML IJ SOLN
10.0000 mg | Freq: Once | INTRAMUSCULAR | Status: AC
  Administered 2017-06-04: 10 mg via INTRAVENOUS
  Filled 2017-06-04: qty 2

## 2017-06-04 MED ORDER — KETOROLAC TROMETHAMINE 30 MG/ML IJ SOLN
30.0000 mg | Freq: Once | INTRAMUSCULAR | Status: AC
  Administered 2017-06-04: 30 mg via INTRAVENOUS
  Filled 2017-06-04: qty 1

## 2017-06-04 MED ORDER — DEXAMETHASONE SODIUM PHOSPHATE 10 MG/ML IJ SOLN
10.0000 mg | Freq: Once | INTRAMUSCULAR | Status: AC
  Administered 2017-06-04: 10 mg via INTRAVENOUS
  Filled 2017-06-04: qty 1

## 2017-06-04 NOTE — ED Provider Notes (Signed)
[facility]   CSN: [id number] Arrival date & time: 06/03/17  1816     History   Chief Complaint Chief Complaint  Patient presents with  . Headache    HPI Joy Hartman is a 27 y.o. female with past medical history of asthma, who presents to ED for evaluation of intermittent migraine-like headache for the past 2 weeks.  She also reports associated nausea and one episode of vomiting once.  Also states that she feels like the room is spinning when she closes her eyes.  She reports history of migraine headaches in the past at least once a week which usually resolve with Aleve.  She did take 1 dose of Aleve when symptoms began but denies any improvement in her symptoms.  She denies any head injuries, vision changes, numbness in arms or legs abdominal pain, chest pain or changes in medications, neck pain or fevers.  She would also like a refill of her albuterol inhaler.  HPI  Past Medical History:  Diagnosis Date  . Asthma   . Chlamydia   . Gonorrhea   . Panic attack   . Trichomonas     Patient Active Problem List   Diagnosis Date Noted  . Bilateral hand swelling 11/09/2014  . Panic attacks 03/30/2014  . Elevated random blood glucose level 03/30/2014  . Healthcare maintenance 03/30/2014  . Asthma 07/13/2010  . HEADACHE 07/13/2010    History reviewed. No pertinent surgical history.  OB History    Gravida Para Term Preterm AB Living   1 1 1  0 0 1   SAB TAB Ectopic Multiple Live Births   0 0 0 0 1       Home Medications    Prior to Admission medications   Medication Sig Start Date End Date Taking? Authorizing Provider  albuterol (PROVENTIL HFA;VENTOLIN HFA) 108 (90 Base) MCG/ACT inhaler Inhale 1-2 puffs into the lungs every 6 (six) hours as needed for wheezing or shortness of breath. 06/04/17   Ravenne Wayment, PA-C  cetirizine (ZYRTEC) 5 MG tablet Take 1 tablet (5 mg total) by mouth daily. 12/09/15   Joanna Pufforsey, Crystal S,  MD  fluticasone (FLONASE) 50 MCG/ACT nasal spray Place 2 sprays into both nostrils daily. 12/09/15   Joanna Pufforsey, Crystal S, MD  meloxicam (MOBIC) 15 MG tablet Take 1 tablet (15 mg total) by mouth daily. 09/29/14   Reva BoresPratt, Tanya S, MD  naproxen (NAPROSYN) 375 MG tablet Take 1 tablet (375 mg total) by mouth 2 (two) times daily. 05/25/16   Felicie MornSmith, David, NP  penicillin v potassium (VEETID) 500 MG tablet Take 1 tablet (500 mg total) by mouth 3 (three) times daily. 05/25/16   Felicie MornSmith, David, NP  triamcinolone ointment (KENALOG) 0.1 % Apply 1 application topically 2 (two) times daily. 11/09/14   Tommie Samsook, Jayce G, DO    Family History Family History  Problem Relation Age of Onset  . Asthma Mother   . Hypertension Mother   . Hypertension Father   . Hypertension Maternal Grandmother   . Hypertension Maternal Grandfather   . Hypertension Paternal Grandmother   . Hypertension Paternal Grandfather     Social History Social History   Tobacco Use  . Smoking status: Never Smoker  . Smokeless tobacco: Never Used  Substance Use Topics  . Alcohol use: No  . Drug use: Yes    Types: Marijuana     Allergies   Hydrocodone   Review of Systems Review of Systems  Constitutional: Negative  for appetite change, chills and fever.  HENT: Negative for ear pain, rhinorrhea, sneezing and sore throat.   Eyes: Negative for photophobia and visual disturbance.  Respiratory: Negative for cough, chest tightness, shortness of breath and wheezing.   Cardiovascular: Negative for chest pain and palpitations.  Gastrointestinal: Positive for nausea and vomiting. Negative for abdominal pain, blood in stool, constipation and diarrhea.  Genitourinary: Negative for dysuria, hematuria and urgency.  Musculoskeletal: Negative for myalgias.  Skin: Negative for rash.  Neurological: Positive for headaches. Negative for dizziness, weakness and light-headedness.     Physical Exam Updated Vital Signs BP 128/72   Pulse 72   Temp 97.7  F (36.5 C) (Oral)   Resp 14   Ht 5\' 6"  (1.676 m)   Wt 88 kg (194 lb)   LMP 06/01/2017   SpO2 100%   BMI 31.31 kg/m   Physical Exam  Constitutional: She is oriented to person, place, and time. She appears well-developed and well-nourished. No distress.  Nontoxic appearing and in no acute distress.  HENT:  Head: Normocephalic and atraumatic.  Nose: Nose normal.  Eyes: Conjunctivae and EOM are normal. Pupils are equal, round, and reactive to light. Right eye exhibits no discharge. Left eye exhibits no discharge. No scleral icterus.  Horizontal nystagmus noted bilaterally.  Neck: Normal range of motion. Neck supple.  Cardiovascular: Normal rate, regular rhythm, normal heart sounds and intact distal pulses. Exam reveals no gallop and no friction rub.  No murmur heard. Pulmonary/Chest: Effort normal and breath sounds normal. No respiratory distress.  Abdominal: Soft. Bowel sounds are normal. She exhibits no distension. There is no tenderness. There is no guarding.  Musculoskeletal: Normal range of motion. She exhibits no edema.  Neurological: She is alert and oriented to person, place, and time. No cranial nerve deficit or sensory deficit. She exhibits normal muscle tone. Coordination normal.  Pupils reactive. No facial asymmetry noted. Cranial nerves appear grossly intact. Sensation intact to light touch on face, BUE and BLE. Strength 5/5 in BUE and BLE. Normal finger to nose coordination bilaterally.  Skin: Skin is warm and dry. No rash noted.  Psychiatric: She has a normal mood and affect.  Nursing note and vitals reviewed.    ED Treatments / Results  Labs (all labs ordered are listed, but only abnormal results are displayed) Labs Reviewed  POC URINE PREG, ED    EKG  EKG Interpretation None       Radiology No results found.  Procedures Procedures (including critical care time)  Medications Ordered in ED Medications  albuterol (PROVENTIL HFA;VENTOLIN HFA) 108 (90  Base) MCG/ACT inhaler 1 puff (not administered)  ketorolac (TORADOL) 30 MG/ML injection 30 mg (30 mg Intravenous Given 06/04/17 0137)  sodium chloride 0.9 % bolus 1,000 mL (0 mLs Intravenous Stopped 06/04/17 0240)  metoCLOPramide (REGLAN) injection 10 mg (10 mg Intravenous Given 06/04/17 0257)  dexamethasone (DECADRON) injection 10 mg (10 mg Intravenous Given 06/04/17 0257)     Initial Impression / Assessment and Plan / ED Course  I have reviewed the triage vital signs and the nursing notes.  Pertinent labs & imaging results that were available during my care of the patient were reviewed by me and considered in my medical decision making (see chart for details).     Patient comes with a past medical history of asthma, who presents to ED for evaluation of intermittent migraine-like headache for the past 2 weeks.  She also reports associated nausea and one episode of vomiting when symptoms began.  She also reports vertiginous symptoms such as feeling like the room is spinning when she closes her eyes.  Reports history of migraine headaches in the past at least once a week.  She denies any head injury, vision changes, numbness in the arms or legs, trouble breathing or chest pain.  On physical exam she is overall well-appearing.  Lungs are clear to auscultation bilaterally.  She has no deficits on her neurological exam.  Reglan, Decadron with complete resolution of her symptoms.  I have low suspicion for acute intracranial abnormality or vascular cause of her symptoms.  I suspect migraine.  She would also like a refill of her albuterol.  We will also give meclizine for her apparent vertiginous symptoms.  Declines breathing treatment or need for breathing treatment at this time.  To follow-up with PCP for further evaluation.  Patient appears stable for discharge at this time.  Strict return precautions given.  Final Clinical Impressions(s) / ED Diagnoses   Final diagnoses:  Migraine without aura and without  status migrainosus, not intractable    ED Discharge Orders        Ordered    albuterol (PROVENTIL HFA;VENTOLIN HFA) 108 (90 Base) MCG/ACT inhaler  Every 6 hours PRN     06/04/17 0341     Portions of this note were generated with Dragon dictation software. Dictation errors may occur despite best attempts at proofreading.    Dietrich Pates, PA-C 06/04/17 1610    Palumbo, April, MD 06/04/17 [phone]

## 2017-06-04 NOTE — ED Notes (Signed)
Two failed attempts to start iv.  Once in right ac and once in right hand.  Started by Delorise Jacksonori at this time.

## 2017-06-04 NOTE — Discharge Instructions (Signed)
Please read attached information regarding your condition. Take your albuterol inhaler as needed. Return to ED for worsening symptoms, trouble breathing, increased wheezing, coughing up increased mucus, chest pain.

## 2018-06-15 ENCOUNTER — Emergency Department (HOSPITAL_COMMUNITY)
Admission: EM | Admit: 2018-06-15 | Discharge: 2018-06-16 | Disposition: A | Discharge status: Home / Self Care | Payer: Self-pay | Attending: Emergency Medicine | Admitting: Emergency Medicine

## 2018-06-15 ENCOUNTER — Other Ambulatory Visit: Payer: Self-pay

## 2018-06-15 ENCOUNTER — Emergency Department (HOSPITAL_COMMUNITY): Payer: Self-pay

## 2018-06-15 DIAGNOSIS — R079 Chest pain, unspecified: Secondary | ICD-10-CM | POA: Insufficient documentation

## 2018-06-15 DIAGNOSIS — Z79899 Other long term (current) drug therapy: Secondary | ICD-10-CM | POA: Insufficient documentation

## 2018-06-15 DIAGNOSIS — J45909 Unspecified asthma, uncomplicated: Secondary | ICD-10-CM | POA: Insufficient documentation

## 2018-06-15 LAB — CBC
HEMATOCRIT: 36.1 % (ref 36.0–46.0)
HEMOGLOBIN: 12.1 g/dL (ref 12.0–15.0)
MCH: 32.5 pg (ref 26.0–34.0)
MCHC: 33.5 g/dL (ref 30.0–36.0)
MCV: 97 fL (ref 80.0–100.0)
Platelets: 270 10*3/uL (ref 150–400)
RBC: 3.72 MIL/uL — ABNORMAL LOW (ref 3.87–5.11)
RDW: 11.8 % (ref 11.5–15.5)
WBC: 6.8 10*3/uL (ref 4.0–10.5)
nRBC: 0 % (ref 0.0–0.2)

## 2018-06-15 LAB — I-STAT TROPONIN, ED: Troponin i, poc: 0 ng/mL (ref 0.00–0.08)

## 2018-06-15 LAB — BASIC METABOLIC PANEL
Anion gap: 9 (ref 5–15)
BUN: 5 mg/dL — AB (ref 6–20)
CHLORIDE: 104 mmol/L (ref 98–111)
CO2: 25 mmol/L (ref 22–32)
Calcium: 9 mg/dL (ref 8.9–10.3)
Creatinine, Ser: 0.77 mg/dL (ref 0.44–1.00)
GFR calc Af Amer: >60 mL/min (ref >60)
GFR calc non Af Amer: >60 mL/min (ref >60)
Glucose, Bld: 92 mg/dL (ref 70–99)
POTASSIUM: 3.5 mmol/L (ref 3.5–5.1)
Sodium: 138 mmol/L (ref 135–145)

## 2018-06-15 LAB — I-STAT BETA HCG BLOOD, ED (MC, WL, AP ONLY): I-stat hCG, quantitative: <5 m[IU]/mL (ref <5)

## 2018-06-15 MED ORDER — LIDOCAINE VISCOUS HCL 2 % MT SOLN
15.0000 mL | Freq: Once | OROMUCOSAL | Status: AC
  Administered 2018-06-15: 15 mL via ORAL
  Filled 2018-06-15: qty 15

## 2018-06-15 MED ORDER — SODIUM CHLORIDE 0.9 % IV BOLUS
1000.0000 mL | Freq: Once | INTRAVENOUS | Status: AC
  Administered 2018-06-15: 1000 mL via INTRAVENOUS

## 2018-06-15 MED ORDER — PANTOPRAZOLE SODIUM 40 MG IV SOLR
40.0000 mg | Freq: Once | INTRAVENOUS | Status: AC
  Administered 2018-06-15: 40 mg via INTRAVENOUS
  Filled 2018-06-15: qty 40

## 2018-06-15 MED ORDER — ALUM & MAG HYDROXIDE-SIMETH 200-200-20 MG/5ML PO SUSP
30.0000 mL | Freq: Once | ORAL | Status: AC
  Administered 2018-06-15: 30 mL via ORAL
  Filled 2018-06-15: qty 30

## 2018-06-15 MED ORDER — SODIUM CHLORIDE 0.9% FLUSH
3.0000 mL | Freq: Once | INTRAVENOUS | Status: AC
  Administered 2018-06-15: 3 mL via INTRAVENOUS

## 2018-06-15 MED ORDER — KETOROLAC TROMETHAMINE 30 MG/ML IJ SOLN
30.0000 mg | Freq: Once | INTRAMUSCULAR | Status: AC
  Administered 2018-06-15: 30 mg via INTRAVENOUS
  Filled 2018-06-15: qty 1

## 2018-06-15 NOTE — ED Notes (Signed)
Attempted to gain IV access x2, without success. Seeking assistance.

## 2018-06-15 NOTE — ED Notes (Signed)
Pt to radiology via stretcher.  

## 2018-06-15 NOTE — ED Notes (Signed)
Immediately after the venipuncture pt stated she felt lightheaded.  I recheck pt's v/s and notified green zone nurse.

## 2018-06-15 NOTE — ED Provider Notes (Signed)
[facility] Provider Note   CSN: [id number] Arrival date & time: 06/15/18  2155     History   Chief Complaint Chief Complaint  Patient presents with  . Chest Pain  . Shortness of Breath    HPI Joy Hartman is a 28 y.o. female.   28 year old female presents to the emergency department for evaluation of chest pain.  She describes a sharp central, substernal chest pain radiating towards her throat which has been constant since onset at 2 AM today.  States that she felt full after eating and it slightly made her chest pain more uncomfortable.  She has not had any worsening pain with exertion.  Reports some shortness of breath tonight.  Symptoms preceded by a left frontal headache, though this has improved with Excedrin.  Excedrin provided no relief to her chest discomfort.  She has not had any lightheadedness, syncope, vomiting, fevers.  Denies any drug use or family history of sudden cardiac death.  No history of tobacco use.  Denies drug use.  The history is provided by the patient. No language interpreter was used.  Chest Pain  Associated symptoms: shortness of breath   Shortness of Breath  Associated symptoms: chest pain     Past Medical History:  Diagnosis Date  . Asthma   . Chlamydia   . Gonorrhea   . Panic attack   . Trichomonas     Patient Active Problem List   Diagnosis Date Noted  . Bilateral hand swelling 11/09/2014  . Panic attacks 03/30/2014  . Elevated random blood glucose level 03/30/2014  . Healthcare maintenance 03/30/2014  . Asthma 07/13/2010  . HEADACHE 07/13/2010    No past surgical history on file.   OB History    Gravida  1   Para  1   Term  1   Preterm  0   AB  0   Living  1     SAB  0   TAB  0   Ectopic  0   Multiple  0   Live Births  1            Home Medications    Prior to Admission medications   Medication Sig Start Date End Date Taking? Authorizing Provider    albuterol (PROVENTIL HFA;VENTOLIN HFA) 108 (90 Base) MCG/ACT inhaler Inhale 1-2 puffs into the lungs every 6 (six) hours as needed for wheezing or shortness of breath. 06/04/17   Khatri, Hina, PA-C  cetirizine (ZYRTEC) 5 MG tablet Take 1 tablet (5 mg total) by mouth daily. 12/09/15   Joanna Pufforsey, Crystal S, MD  fluticasone (FLONASE) 50 MCG/ACT nasal spray Place 2 sprays into both nostrils daily. 12/09/15   Joanna Pufforsey, Crystal S, MD  meclizine (ANTIVERT) 12.5 MG tablet Take 1 tablet (12.5 mg total) by mouth 3 (three) times daily as needed for dizziness. 06/04/17   Khatri, Hina, PA-C  meloxicam (MOBIC) 15 MG tablet Take 1 tablet (15 mg total) by mouth daily. 09/29/14   Reva BoresPratt, Tanya S, MD  naproxen (NAPROSYN) 375 MG tablet Take 1 tablet (375 mg total) by mouth 2 (two) times daily. 05/25/16   Felicie MornSmith, David, NP  penicillin v potassium (VEETID) 500 MG tablet Take 1 tablet (500 mg total) by mouth 3 (three) times daily. 05/25/16   Felicie MornSmith, David, NP  triamcinolone ointment (KENALOG) 0.1 % Apply 1 application topically 2 (two) times daily. 11/09/14   Tommie Samsook, Jayce G, DO  fexofenadine (ALLEGRA) 180 MG tablet Take 180 mg  by mouth daily as needed for allergies or rhinitis.  08/17/13  [provider]    Family History Family History  Problem Relation Age of Onset  . Asthma Mother   . Hypertension Mother   . Hypertension Father   . Hypertension Maternal Grandmother   . Hypertension Maternal Grandfather   . Hypertension Paternal Grandmother   . Hypertension Paternal Grandfather     Social History Social History   Tobacco Use  . Smoking status: Never Smoker  . Smokeless tobacco: Never Used  Substance Use Topics  . Alcohol use: No  . Drug use: Yes    Types: Marijuana     Allergies   Hydrocodone   Review of Systems Review of Systems  Respiratory: Positive for shortness of breath.   Cardiovascular: Positive for chest pain.  Ten systems reviewed and are negative for acute change, except as noted in the  HPI.    Physical Exam Updated Vital Signs BP 125/75   Pulse 60   Temp 98.5 F (36.9 C) (Oral)   Resp (!) 22   Ht 5\' 6"  (1.676 m)   Wt 88.9 kg   LMP 06/13/2018 (Exact Date)   SpO2 100%   BMI 31.64 kg/m   Physical Exam Vitals signs and nursing note reviewed.  Constitutional:      General: She is not in acute distress.    Appearance: She is well-developed. She is not diaphoretic.     Comments: Nontoxic appearing and in NAD  HENT:     Head: Normocephalic and atraumatic.  Eyes:     General: No scleral icterus.    Conjunctiva/sclera: Conjunctivae normal.  Neck:     Musculoskeletal: Normal range of motion.  Cardiovascular:     Rate and Rhythm: Normal rate and regular rhythm.     Pulses: Normal pulses.  Pulmonary:     Effort: Pulmonary effort is normal. No respiratory distress.     Breath sounds: No wheezing, rhonchi or rales.     Comments: Lungs CTAB. Respirations even and unlabored Musculoskeletal: Normal range of motion.     Right lower leg: No edema.     Left lower leg: No edema.  Skin:    General: Skin is warm and dry.     Coloration: Skin is not pale.     Findings: No erythema or rash.  Neurological:     General: No focal deficit present.     Mental Status: She is alert and oriented to person, place, and time.     Coordination: Coordination normal.  Psychiatric:        Behavior: Behavior normal.      ED Treatments / Results  Labs (all labs ordered are listed, but only abnormal results are displayed) Labs Reviewed  BASIC METABOLIC PANEL - Abnormal; Notable for the following components:      Result Value   BUN 5 (*)    All other components within normal limits  CBC - Abnormal; Notable for the following components:   RBC 3.72 (*)    All other components within normal limits  I-STAT TROPONIN, ED  I-STAT BETA HCG BLOOD, ED ([facility], [facility], [facility] ONLY)    EKG EKG Interpretation  Date/Time:  Saturday June 15 2018 22:01:06 EST Ventricular Rate:  74 PR  Interval:  116 QRS Duration: 86 QT Interval:  394 QTC Calculation: 437 R Axis:   68 Text Interpretation:  Sinus rhythm with marked sinus arrhythmia Otherwise normal ECG Confirmed by Kennis Carina [phone]) on 06/15/2018 10:57:24 PM  Radiology Dg Chest 2 View  Result Date: 06/15/2018 CLINICAL DATA:  28 year old female with shortness of breath and chest pain. EXAM: CHEST - 2 VIEW COMPARISON:  Chest CT dated 02/25/2011 FINDINGS: The heart size and mediastinal contours are within normal limits. Both lungs are clear. The visualized skeletal structures are unremarkable. IMPRESSION: No active cardiopulmonary disease. Electronically Signed   By: Elgie CollardArash  Radparvar M.D.   On: 06/15/2018 22:47    Procedures Procedures (including critical care time)  Medications Ordered in ED Medications  sodium chloride flush (NS) 0.9 % injection 3 mL (3 mLs Intravenous Given 06/15/18 2333)  alum & mag hydroxide-simeth (MAALOX/MYLANTA) 200-200-20 MG/5ML suspension 30 mL (30 mLs Oral Given 06/15/18 2305)    And  lidocaine (XYLOCAINE) 2 % viscous mouth solution 15 mL (15 mLs Oral Given 06/15/18 2305)  pantoprazole (PROTONIX) injection 40 mg (40 mg Intravenous Given 06/15/18 2335)  ketorolac (TORADOL) 30 MG/ML injection 30 mg (30 mg Intravenous Given 06/15/18 2337)  sodium chloride 0.9 % bolus 1,000 mL (0 mLs Intravenous Stopped 06/16/18 0044)     Initial Impression / Assessment and Plan / ED Course  I have reviewed the triage vital signs and the nursing notes.  Pertinent labs & imaging results that were available during my care of the patient were reviewed by me and considered in my medical decision making (see chart for details).     Patient presents to the emergency department for evaluation of chest pain.  Low suspicion for emergent cardiac etiology given reassuring workup today.  EKG is nonischemic and troponin negative.  Patient has a heart score of 0 consistent with low risk of acute coronary event.  Chest x-ray  without evidence of mediastinal widening to suggest dissection.  No pneumothorax, pneumonia, pleural effusion.  Pulmonary embolus further considered; however, patient without tachycardia, tachypnea, dyspnea, hypoxia.  Patient is PERC negative.  Question whether symptoms may be secondary to reflux as patient reports fullness with eating, slightly aggravating her chest discomfort.  She has been advised to start over-the-counter Prilosec.  Encouraged follow-up with a primary care doctor.  Return precautions discussed and provided. Patient discharged in stable condition with no unaddressed concerns.   Vitals:   06/16/18 0000 06/16/18 0015 06/16/18 0030 06/16/18 0045  BP: (!) 137/94 (!) 132/94 126/82 125/75  Pulse: 65 (!) 57 (!) 59 60  Resp: 17 (!) 22 (!) 21 (!) 22  Temp:      TempSrc:      SpO2: 100% 100% 100% 100%  Weight:      Height:        Final Clinical Impressions(s) / ED Diagnoses   Final diagnoses:  Chest pain, unspecified type    ED Discharge Orders    None       Antony MaduraHumes, Marae Cottrell, PA-C 06/16/18 0104    Sabas SousBero, Michael M, MD 06/16/18 [phone]

## 2018-06-15 NOTE — ED Triage Notes (Signed)
Pt reports centeralized chest pain that woke her up out of sleep last at which time she also became SOB.  Pt reports "minor pain" all day but has intensified tonight.

## 2018-06-16 NOTE — ED Notes (Signed)
Reviewed d/c instructions with pt, who verbalized understanding and had no outstanding questions. Pt departed in NAD.   

## 2018-06-16 NOTE — Discharge Instructions (Signed)
Your work-up in the emergency department today was reassuring.  We recommend follow-up with a primary care doctor to ensure resolution of symptoms.  Continue with over-the-counter remedies such as Tylenol or ibuprofen.  You may benefit from the use of over-the-counter Prilosec as your symptoms may be due to reflux.  Return to the ED for new or concerning symptoms.

## 2018-08-24 ENCOUNTER — Ambulatory Visit: Admit: 2018-08-24 | Discharge status: Absent without leave | Payer: MEDICAID

## 2018-09-02 ENCOUNTER — Other Ambulatory Visit: Payer: Self-pay

## 2018-09-02 ENCOUNTER — Inpatient Hospital Stay (HOSPITAL_COMMUNITY): Payer: Self-pay

## 2018-09-02 ENCOUNTER — Inpatient Hospital Stay (HOSPITAL_COMMUNITY)
Admission: AD | Admit: 2018-09-02 | Discharge: 2018-09-02 | Disposition: A | Discharge status: Home / Self Care | Payer: Self-pay | Attending: Obstetrics & Gynecology | Admitting: Obstetrics & Gynecology

## 2018-09-02 ENCOUNTER — Encounter (HOSPITAL_COMMUNITY): Payer: Self-pay

## 2018-09-02 DIAGNOSIS — O99511 Diseases of the respiratory system complicating pregnancy, first trimester: Secondary | ICD-10-CM | POA: Insufficient documentation

## 2018-09-02 DIAGNOSIS — N939 Abnormal uterine and vaginal bleeding, unspecified: Secondary | ICD-10-CM

## 2018-09-02 DIAGNOSIS — Z825 Family history of asthma and other chronic lower respiratory diseases: Secondary | ICD-10-CM | POA: Insufficient documentation

## 2018-09-02 DIAGNOSIS — Z792 Long term (current) use of antibiotics: Secondary | ICD-10-CM | POA: Insufficient documentation

## 2018-09-02 DIAGNOSIS — J45909 Unspecified asthma, uncomplicated: Secondary | ICD-10-CM | POA: Insufficient documentation

## 2018-09-02 DIAGNOSIS — Z3A01 Less than 8 weeks gestation of pregnancy: Secondary | ICD-10-CM | POA: Insufficient documentation

## 2018-09-02 DIAGNOSIS — O9989 Other specified diseases and conditions complicating pregnancy, childbirth and the puerperium: Secondary | ICD-10-CM | POA: Insufficient documentation

## 2018-09-02 DIAGNOSIS — Z885 Allergy status to narcotic agent status: Secondary | ICD-10-CM | POA: Insufficient documentation

## 2018-09-02 DIAGNOSIS — F410 Panic disorder [episodic paroxysmal anxiety] without agoraphobia: Secondary | ICD-10-CM | POA: Insufficient documentation

## 2018-09-02 DIAGNOSIS — M545 Low back pain: Secondary | ICD-10-CM | POA: Insufficient documentation

## 2018-09-02 DIAGNOSIS — O4690 Antepartum hemorrhage, unspecified, unspecified trimester: Secondary | ICD-10-CM

## 2018-09-02 DIAGNOSIS — Z6740 Type O blood, Rh positive: Secondary | ICD-10-CM | POA: Insufficient documentation

## 2018-09-02 DIAGNOSIS — O3680X0 Pregnancy with inconclusive fetal viability, not applicable or unspecified: Secondary | ICD-10-CM | POA: Insufficient documentation

## 2018-09-02 DIAGNOSIS — Z79899 Other long term (current) drug therapy: Secondary | ICD-10-CM | POA: Insufficient documentation

## 2018-09-02 DIAGNOSIS — O99341 Other mental disorders complicating pregnancy, first trimester: Secondary | ICD-10-CM | POA: Insufficient documentation

## 2018-09-02 DIAGNOSIS — O4691 Antepartum hemorrhage, unspecified, first trimester: Secondary | ICD-10-CM | POA: Insufficient documentation

## 2018-09-02 LAB — WET PREP, GENITAL
Clue Cells Wet Prep HPF POC: NONE SEEN
Sperm: NONE SEEN
Trich, Wet Prep: NONE SEEN
Yeast Wet Prep HPF POC: NONE SEEN

## 2018-09-02 LAB — URINALYSIS, ROUTINE W REFLEX MICROSCOPIC
Bilirubin Urine: NEGATIVE
Glucose, UA: NEGATIVE mg/dL
Ketones, ur: NEGATIVE mg/dL
Nitrite: NEGATIVE
Protein, ur: NEGATIVE mg/dL
RBC / HPF: >50 RBC/hpf — ABNORMAL HIGH (ref 0–5)
Specific Gravity, Urine: 1.019 (ref 1.005–1.030)
WBC, UA: >50 WBC/hpf — ABNORMAL HIGH (ref 0–5)
pH: 5 (ref 5.0–8.0)

## 2018-09-02 LAB — CBC
HCT: 34.7 % — ABNORMAL LOW (ref 36.0–46.0)
Hemoglobin: 11.6 g/dL — ABNORMAL LOW (ref 12.0–15.0)
MCH: 32.3 pg (ref 26.0–34.0)
MCHC: 33.4 g/dL (ref 30.0–36.0)
MCV: 96.7 fL (ref 80.0–100.0)
Platelets: 218 10*3/uL (ref 150–400)
RBC: 3.59 MIL/uL — ABNORMAL LOW (ref 3.87–5.11)
RDW: 11.5 % (ref 11.5–15.5)
WBC: 6.6 10*3/uL (ref 4.0–10.5)
nRBC: 0 % (ref 0.0–0.2)

## 2018-09-02 LAB — HCG, QUANTITATIVE, PREGNANCY: hCG, Beta Chain, Quant, S: 689 m[IU]/mL — ABNORMAL HIGH (ref <5)

## 2018-09-02 MED ORDER — ACETAMINOPHEN 500 MG PO TABS
1000.0000 mg | ORAL_TABLET | Freq: Once | ORAL | Status: AC
  Administered 2018-09-02: 1000 mg via ORAL
  Filled 2018-09-02: qty 2

## 2018-09-02 NOTE — MAU Provider Note (Signed)
History     CSN: [id number]  Arrival date and time: 09/02/18 1524   First Provider Initiated Contact with Patient 09/02/18 1624     Chief Complaint  Patient presents with  . Vaginal Bleeding   HPI Joy Hartman is a 28 y.o. G2P1001 at [redacted]w[redacted]d by certain LMP who presents to MAU with chief complaint of heavy vaginal bleeding. This is a new problem, onset two days ago. Patient states her bleeding has waxed and waned since that time but she started bleeding heavily again this morning.  Patient reports secondary complaint of low back pain. This is a new problem, onset coinciding with the onset of her bleeding two days ago. She rates her pain as 5/10. It does not radiate. She denies aggravating or alleviating factors. She has not taken medication or tried other treatments for this complaint.  Patient lives with her son. She denies concern for self-harm, HI  Or IPV.  OB History    Gravida  2   Para  1   Term  1   Preterm  0   AB  0   Living  1     SAB  0   TAB  0   Ectopic  0   Multiple  0   Live Births  1           Past Medical History:  Diagnosis Date  . Asthma   . Chlamydia   . Gonorrhea   . Panic attack   . Trichomonas     History reviewed. No pertinent surgical history.  Family History  Problem Relation Age of Onset  . Asthma Mother   . Hypertension Mother   . Hypertension Father   . Hypertension Maternal Grandmother   . Hypertension Maternal Grandfather   . Hypertension Paternal Grandmother   . Hypertension Paternal Grandfather     Social History   Tobacco Use  . Smoking status: Never Smoker  . Smokeless tobacco: Never Used  Substance Use Topics  . Alcohol use: No  . Drug use: Yes    Types: Marijuana    Allergies:  Allergies  Allergen Reactions  . Hydrocodone Itching and Swelling    Itching and swelling    Medications Prior to Admission  Medication Sig Dispense Refill Last Dose  . albuterol (PROVENTIL HFA;VENTOLIN HFA) 108 (90  Base) MCG/ACT inhaler Inhale 1-2 puffs into the lungs every 6 (six) hours as needed for wheezing or shortness of breath. 1 Inhaler 1 Past Month at Unknown time  . Prenatal Vit-Fe Fumarate-FA (PRENATAL MULTIVITAMIN) TABS tablet Take 1 tablet by mouth daily at 12 noon.     . cetirizine (ZYRTEC) 5 MG tablet Take 1 tablet (5 mg total) by mouth daily. 30 tablet 0   . fluticasone (FLONASE) 50 MCG/ACT nasal spray Place 2 sprays into both nostrils daily. 16 g 0   . meclizine (ANTIVERT) 12.5 MG tablet Take 1 tablet (12.5 mg total) by mouth 3 (three) times daily as needed for dizziness. 30 tablet 0   . meloxicam (MOBIC) 15 MG tablet Take 1 tablet (15 mg total) by mouth daily. 15 tablet 0   . naproxen (NAPROSYN) 375 MG tablet Take 1 tablet (375 mg total) by mouth 2 (two) times daily. 20 tablet 0   . penicillin v potassium (VEETID) 500 MG tablet Take 1 tablet (500 mg total) by mouth 3 (three) times daily. 30 tablet 0   . triamcinolone ointment (KENALOG) 0.1 % Apply 1 application topically 2 (two) times daily.  30 g 0     Review of Systems  Constitutional: Negative for chills, fatigue and fever.  Gastrointestinal: Negative for abdominal pain, nausea and vomiting.  Genitourinary: Positive for vaginal bleeding.  Musculoskeletal: Positive for back pain.  Neurological: Negative for syncope, weakness and headaches.  All other systems reviewed and are negative.  Physical Exam   Blood pressure 121/61, pulse 77, temperature 98.6 F (37 C), temperature source Oral, resp. rate 16, height 5\' 6"  (1.676 m), weight 80.9 kg, last menstrual period 07/13/2018, SpO2 100 %.  Physical Exam  Nursing note and vitals reviewed. Constitutional: She is oriented to person, place, and time. She appears well-developed and well-nourished.  Cardiovascular: Normal rate.  Respiratory: Effort normal.  GI: Soft. She exhibits no distension. There is no abdominal tenderness. There is no guarding.  Genitourinary: Cervix exhibits no  motion tenderness and no friability.    Genitourinary Comments: Patient actively bleeding upon CNM assessment and SSE. Clots removed with fox swab x2. Small continuous bleeding afterwards   Musculoskeletal: Normal range of motion.  Neurological: She is oriented to person, place, and time.  Skin: Skin is warm and dry.  Psychiatric: She has a normal mood and affect. Her behavior is normal. Judgment and thought content normal.    MAU Course/MDM  Procedures: sterile speculum exam  --Back pain resolved with Tylenol  Patient Vitals for the past 24 hrs:  BP Temp Temp src Pulse Resp SpO2 Height Weight  09/02/18 1616 121/61 98.6 F (37 C) Oral 77 16 100 % - -  09/02/18 1539 126/76 99.2 F (37.3 C) Oral - 16 99 % 5\' 6"  (1.676 m) 80.9 kg   Results for orders placed or performed during the hospital encounter of 09/02/18 (from the past 24 hour(s))  Urinalysis, Routine w reflex microscopic     Status: Abnormal   Collection Time: 09/02/18  3:48 PM  Result Value Ref Range   Color, Urine YELLOW YELLOW   APPearance HAZY (A) CLEAR   Specific Gravity, Urine 1.019 1.005 - 1.030   pH 5.0 5.0 - 8.0   Glucose, UA NEGATIVE NEGATIVE mg/dL   Hgb urine dipstick LARGE (A) NEGATIVE   Bilirubin Urine NEGATIVE NEGATIVE   Ketones, ur NEGATIVE NEGATIVE mg/dL   Protein, ur NEGATIVE NEGATIVE mg/dL   Nitrite NEGATIVE NEGATIVE   Leukocytes,Ua TRACE (A) NEGATIVE   RBC / HPF >50 (H) 0 - 5 RBC/hpf   WBC, UA >50 (H) 0 - 5 WBC/hpf   Bacteria, UA RARE (A) NONE SEEN   Squamous Epithelial / LPF 0-5 0 - 5   Mucus PRESENT   CBC     Status: Abnormal   Collection Time: 09/02/18  4:08 PM  Result Value Ref Range   WBC 6.6 4.0 - 10.5 K/uL   RBC 3.59 (L) 3.87 - 5.11 MIL/uL   Hemoglobin 11.6 (L) 12.0 - 15.0 g/dL   HCT 09.0 (L) 30.1 - 49.9 %   MCV 96.7 80.0 - 100.0 fL   MCH 32.3 26.0 - 34.0 pg   MCHC 33.4 30.0 - 36.0 g/dL   RDW 69.2 49.3 - 24.1 %   Platelets 218 150 - 400 K/uL   nRBC 0.0 0.0 - 0.2 %  hCG, quantitative,  pregnancy     Status: Abnormal   Collection Time: 09/02/18  4:08 PM  Result Value Ref Range   hCG, Beta Chain, Quant, S 689 (H) <5 mIU/mL  Wet prep, genital     Status: Abnormal   Collection Time: 09/02/18  4:16  PM  Result Value Ref Range   Yeast Wet Prep HPF POC NONE SEEN NONE SEEN   Trich, Wet Prep NONE SEEN NONE SEEN   Clue Cells Wet Prep HPF POC NONE SEEN NONE SEEN   WBC, Wet Prep HPF POC FEW (A) NONE SEEN   Sperm NONE SEEN     US Ob Less Than 14 Weeks With Ob Transvaginal  Result Date: 09/02/2018 CLINICAL DATA:  Initial evaluation for acute vaginal spotting for 2 days, early pregnancy. EXAM: OBSTETRIC <14 WK [place] AND TRANSVAGINAL OB US TECHNIQUE: Both transabdominal and transvaginal ultrasound examinations were performed for complete evaluation of the gestation as well as the maternal uterus, adnexal regions, and pelvic cul-de-sac. Transvaginal technique was performed to assess early pregnancy. COMPARISON:  None. FINDINGS: Intrauterine gestational sac: Single Yolk sac:  Not visualized. Embryo:  Not visualized. Cardiac Activity: N/A Heart Rate: N/A  bpm MSD: 4.8 mm   5 w   1 d Subchorionic hemorrhage:  None visualized. Maternal uterus/adnexae: Ovaries are normal in appearance bilaterally. Corpus luteal cyst noted on the right. No free fluid within the pelvis. IMPRESSION: 1. Probable early intrauterine gestational sac, but no yolk sac, fetal pole, or cardiac activity yet visualized. Recommend follow-up quantitative B-HCG levels and follow-up US in 14 days to confirm and assess viability. This recommendation follows SRU consensus guidelines: Diagnostic Criteria for Nonviable Pregnancy Early in the First Trimester. Malva Limes Med 2013; 161:0960-45. 2. No other acute maternal uterine or adnexal abnormality identified. Electronically Signed   By: Rise Mu M.D.   On: 09/02/2018 17:59    Assessment and Plan  --28 y.o. G2P1001 at with pregnancy of unknown location at [redacted]w[redacted]d by LMP --Reviewed  ectopic and bleeding precautions --Blood type O POS, Rhogam not indicated --Urine culture pending --Discharge home in stable condition  F/U: Patient to have repeat quant collected at [facility] Thursday morning 09/05/2018. Clinic messaged    Roxy Cedar [place], [place] 09/02/2018, 7:29 PM

## 2018-09-02 NOTE — Discharge Instructions (Signed)
Human Chorionic Gonadotropin Test °Why am I having this test? °A human chorionic gonadotropin (hCG) test is done to determine whether you are pregnant. It can also be used: °· To diagnose an abnormal pregnancy. °· To determine whether you have had a failed pregnancy (miscarriage) or are at risk of one. °What is being tested? °This test checks the level of the human chorionic gonadotropin (hCG) hormone in the blood. This hormone is produced during pregnancy by the cells that form the placenta. The placenta is the organ that grows inside your womb (uterus) to nourish a developing baby. When you are pregnant, hCG can be detected in your blood or urine 7 to 8 days before your missed period. It continues to go up for the first 8-10 weeks of pregnancy. °The presence of hCG in your blood can be measured with several different types of tests. You may have: °· A urine test. °? Because this hormone is eliminated from your body by your kidneys, you may have a urine test to find out whether you are pregnant. A home pregnancy test detects whether there is hCG in your urine. °? A urine test only shows whether there is hCG in your urine. It does not measure how much. °· A qualitative blood test. °? You may have this type of blood test to find out if you are pregnant. °? This blood test only shows whether there is hCG in your blood. It does not measure how much. °· A quantitative blood test. °? This type of blood test measures the amount of hCG in your blood. °? You may have this test to: °§ Diagnose an abnormal pregnancy. °§ Check whether you have had a miscarriage. °§ Determine whether you are at risk of a miscarriage. °What kind of sample is taken? ° °  ° °Two kinds of samples may be collected to test for the hCG hormone. °· Blood. It is usually collected by inserting a needle into a blood vessel. °· Urine. It is usually collected by urinating into a germ-free (sterile) specimen cup. It is best to collect the sample the first  time you urinate in the morning. °How do I prepare for this test? °No preparation is needed for a blood test.  °For the urine test: °· Let your health care provider know about: °? All medicines you are taking, including vitamins, herbs, creams, and over-the-counter medicines. °? Any blood in your urine. This may interfere with the result. °· Do not drink too much fluid. Drink as you normally would, or as directed by your health care provider. °How are the results reported? °Depending on the type of test that you have, your test results may be reported as values. Your health care provider will compare your results to normal ranges that were established after testing a large group of people (reference ranges). Reference ranges may vary among labs and hospitals. For this test, common reference ranges that show absence of pregnancy are: °· Quantitative hCG blood levels: less than 5 IU/L. °Other results will be reported as either positive or negative. For this test, normal results (meaning the absence of pregnancy) are: °· Negative for hCG in the urine test. °· Negative for hCG in the qualitative blood test. °What do the results mean? °Urine and qualitative blood test °· A negative result could mean: °? That you are not pregnant. °? That the test was done too early in your pregnancy to detect hCG in your blood or urine. If you still have other signs   of pregnancy, the test will be repeated. °· A positive result means: °? That you are most likely pregnant. Your health care provider may confirm your pregnancy with an imaging study (ultrasound) of your uterus, if needed. °Quantitative blood test °Results of the quantitative hCG blood test will be interpreted as follows: °· Less than 5 IU/L: You are most likely not pregnant. °· Greater than 25 IU/L: You are most likely pregnant. °· hCG levels that are higher than expected: °? You are pregnant with twins. °? You have abnormal growths in the uterus. °· hCG levels that are  rising more slowly than expected: °? You have an ectopic pregnancy (also called a tubal pregnancy). °· hCG levels that are falling: °? You may be having a miscarriage. °Talk with your health care provider about what your results mean. °Questions to ask your health care provider °Ask your health care provider, or the department that is doing the test: °· When will my results be ready? °· How will I get my results? °· What are my treatment options? °· What other tests do I need? °· What are my next steps? °Summary °· A human chorionic gonadotropin test is done to determine whether you are pregnant. °· When you are pregnant, hCG can be detected in your blood or urine 7 to 8 days before your missed period. It continues to go up for the first 8-10 weeks of pregnancy. °· Your hCG level can be measured with different types of tests. You may have a urine test, a qualitative blood test, or a quantitative blood test. °· Talk with your health care provider about what your results mean. °This information is not intended to replace advice given to you by your health care provider. Make sure you discuss any questions you have with your health care provider. °Document Released: 06/16/2004 Document Revised: 04/16/2017 Document Reviewed: 04/16/2017 °Elsevier Interactive Patient Education © 2019 Elsevier Inc. ° °

## 2018-09-02 NOTE — MAU Note (Signed)
Pt had positive test at [facility], started spotting two days ago, getting heavier. No pain.

## 2018-09-03 LAB — GC/CHLAMYDIA PROBE AMP (~~LOC~~) NOT AT ARMC
Chlamydia: POSITIVE — AB
Neisseria Gonorrhea: NEGATIVE

## 2018-09-03 LAB — CULTURE, OB URINE: Culture: >=100000 — AB

## 2018-09-04 ENCOUNTER — Other Ambulatory Visit: Payer: Self-pay | Admitting: Advanced Practice Midwife

## 2018-09-04 ENCOUNTER — Encounter: Payer: Self-pay | Admitting: Advanced Practice Midwife

## 2018-09-04 DIAGNOSIS — A749 Chlamydial infection, unspecified: Secondary | ICD-10-CM | POA: Insufficient documentation

## 2018-09-04 MED ORDER — CEPHALEXIN 500 MG PO CAPS: 500.0000 mg | ORAL_CAPSULE | Freq: Four times a day (QID) | ORAL | 2 refills | Status: DC

## 2018-09-04 MED ORDER — AZITHROMYCIN 500 MG PO TABS: 1000.0000 mg | ORAL_TABLET | Freq: Once | ORAL | 0 refills | Status: AC

## 2018-09-04 NOTE — Progress Notes (Unsigned)
Pregnant patient, positive for Chlamydia and UTI. Notified via phone this morning. Discussed importance of testing and close proximity treatment with sexual partner(s). Encouraged condom use to reduce risk of reinfection. Patient verbalized understanding. Rx to pharmacy  Joy Hartman, CNM 09/04/18 8:14 AM

## 2018-09-05 ENCOUNTER — Other Ambulatory Visit: Payer: Self-pay | Admitting: Obstetrics & Gynecology

## 2018-09-05 ENCOUNTER — Other Ambulatory Visit: Payer: Self-pay

## 2018-09-05 ENCOUNTER — Telehealth: Payer: Self-pay | Admitting: Certified Nurse Midwife

## 2018-09-05 DIAGNOSIS — O4690 Antepartum hemorrhage, unspecified, unspecified trimester: Secondary | ICD-10-CM

## 2018-09-05 LAB — BETA HCG QUANT (REF LAB): hCG Quant: 98 m[IU]/mL

## 2018-09-05 NOTE — Progress Notes (Signed)
HCG decreased c/w miscarriage, she will be notified to repeat test in one week

## 2018-09-05 NOTE — Telephone Encounter (Signed)
Gave patient BHCG results, she has already looked them up on MyChart.  Scheduled patient to come back in one week for repeat BHCG and scheduled virtual visit for two weeks out.

## 2018-09-12 ENCOUNTER — Other Ambulatory Visit: Payer: Self-pay

## 2018-09-16 ENCOUNTER — Other Ambulatory Visit: Payer: Self-pay

## 2018-09-16 DIAGNOSIS — O039 Complete or unspecified spontaneous abortion without complication: Secondary | ICD-10-CM

## 2018-09-17 LAB — BETA HCG QUANT (REF LAB): hCG Quant: 2 m[IU]/mL

## 2018-09-19 ENCOUNTER — Encounter: Payer: Self-pay | Admitting: Obstetrics

## 2018-09-19 ENCOUNTER — Other Ambulatory Visit: Payer: Self-pay

## 2018-09-19 ENCOUNTER — Ambulatory Visit (INDEPENDENT_AMBULATORY_CARE_PROVIDER_SITE_OTHER): Payer: Self-pay | Admitting: Obstetrics

## 2018-09-19 DIAGNOSIS — Z3009 Encounter for other general counseling and advice on contraception: Secondary | ICD-10-CM

## 2018-09-19 DIAGNOSIS — O039 Complete or unspecified spontaneous abortion without complication: Secondary | ICD-10-CM

## 2018-09-19 NOTE — Progress Notes (Signed)
Webex visit: SAB follow up  Pt is coping well.

## 2018-09-19 NOTE — Progress Notes (Signed)
[facility]  I connected with@ on 09/19/18 at 11:00 AM EDT by WebEx at home and verified that I am speaking with the correct person using two identifiers.   I discussed the limitations, risks, security and privacy concerns of performing an evaluation and management service by telephone and the availability of in person appointments. I also discussed with the patient that there may be a patient responsible charge related to this service. The patient expressed understanding and agreed to proceed.   History:  Joy Hartman is a 28 y.o. 712P1001 female being evaluated today for history of spontaneous abortion on 09-05-2018. She denies any abnormal vaginal discharge, bleeding, pelvic pain or other concerns.       Past Medical History:  Diagnosis Date  . Asthma   . Chlamydia   . Gonorrhea   . Panic attack   . Trichomonas    History reviewed. No pertinent surgical history. The following portions of the patient's history were reviewed and updated as appropriate: allergies, current medications, past family history, past medical history, past social history, past surgical history and problem list.   Health Maintenance:  Last pap smear date unlnown.  Review of Systems:  Pertinent items noted in HPI and remainder of comprehensive ROS otherwise negative.  Physical Exam:   General:  Alert, oriented and cooperative. Patient appears to be in no acute distress.  Mental Status: Normal mood and affect. Normal behavior. Normal judgment and thought content.   Respiratory: Normal respiratory effort, no problems with respiration noted  Rest of physical exam deferred due to type of encounter  Labs and Imaging Results for orders placed or performed in visit on 09/16/18 (from the past 336 hour(s))  Beta hCG quant (ref lab)   Collection Time: 09/16/18 11:06 AM  Result Value Ref Range   hCG Quant 2 mIU/mL   [place] Ob Less Than 14 Weeks With Ob Transvaginal  Result  Date: 09/02/2018 CLINICAL DATA:  Initial evaluation for acute vaginal spotting for 2 days, early pregnancy. EXAM: OBSTETRIC <14 WK US AND TRANSVAGINAL OB US TECHNIQUE: Both transabdominal and transvaginal ultrasound examinations were performed for complete evaluation of the gestation as well as the maternal uterus, adnexal regions, and pelvic cul-de-sac. Transvaginal technique was performed to assess early pregnancy. COMPARISON:  None. FINDINGS: Intrauterine gestational sac: Single Yolk sac:  Not visualized. Embryo:  Not visualized. Cardiac Activity: N/A Heart Rate: N/A  bpm MSD: 4.8 mm   5 w   1 d Subchorionic hemorrhage:  None visualized. Maternal uterus/adnexae: Ovaries are normal in appearance bilaterally. Corpus luteal cyst noted on the right. No free fluid within the pelvis. IMPRESSION: 1. Probable early intrauterine gestational sac, but no yolk sac, fetal pole, or cardiac activity yet visualized. Recommend follow-up quantitative B-HCG levels and follow-up US in 14 days to confirm and assess viability. This recommendation follows SRU consensus guidelines: Diagnostic Criteria for Nonviable Pregnancy Early in the First Trimester. Malva Limes Engl J Med 2013; 161:0960-45; 369:1443-51. 2. No other acute maternal uterine or adnexal abnormality identified. Electronically Signed   By: Rise MuBenjamin  McClintock M.D.   On: 09/02/2018 17:59       Assessment and Plan:     1. Complete spontaneous abortion - doing well  2. Encounter for other general counseling and advice on contraception - considering options      I discussed the assessment and treatment plan with the patient. The patient was provided an opportunity to ask questions and all were answered. The patient agreed with  the plan and demonstrated an understanding of the instructions.   The patient was advised to call back or seek an in-person evaluation/go to the ED if the symptoms worsen or if the condition fails to improve as anticipated.  I provided 10 minutes of  face-to-face via WebEx time during this encounter.   Coral Ceo, MD [facility] 09-19-2018

## 2018-10-17 ENCOUNTER — Ambulatory Visit: Payer: Self-pay | Admitting: Obstetrics and Gynecology

## 2020-04-11 ENCOUNTER — Encounter (HOSPITAL_COMMUNITY): Payer: Self-pay | Admitting: Obstetrics and Gynecology

## 2020-04-11 ENCOUNTER — Inpatient Hospital Stay (HOSPITAL_COMMUNITY)
Admission: AD | Admit: 2020-04-11 | Discharge: 2020-04-11 | Disposition: A | Discharge status: Home / Self Care | Payer: Medicaid Other | Attending: Obstetrics and Gynecology | Admitting: Obstetrics and Gynecology

## 2020-04-11 ENCOUNTER — Other Ambulatory Visit: Payer: Self-pay

## 2020-04-11 DIAGNOSIS — N760 Acute vaginitis: Secondary | ICD-10-CM | POA: Diagnosis not present

## 2020-04-11 DIAGNOSIS — R519 Headache, unspecified: Secondary | ICD-10-CM | POA: Diagnosis not present

## 2020-04-11 DIAGNOSIS — O23592 Infection of other part of genital tract in pregnancy, second trimester: Secondary | ICD-10-CM | POA: Insufficient documentation

## 2020-04-11 DIAGNOSIS — R109 Unspecified abdominal pain: Secondary | ICD-10-CM | POA: Insufficient documentation

## 2020-04-11 DIAGNOSIS — O26892 Other specified pregnancy related conditions, second trimester: Secondary | ICD-10-CM | POA: Diagnosis not present

## 2020-04-11 DIAGNOSIS — Z885 Allergy status to narcotic agent status: Secondary | ICD-10-CM | POA: Insufficient documentation

## 2020-04-11 DIAGNOSIS — B9689 Other specified bacterial agents as the cause of diseases classified elsewhere: Secondary | ICD-10-CM | POA: Diagnosis not present

## 2020-04-11 DIAGNOSIS — O99891 Other specified diseases and conditions complicating pregnancy: Secondary | ICD-10-CM

## 2020-04-11 DIAGNOSIS — Z3A15 15 weeks gestation of pregnancy: Secondary | ICD-10-CM | POA: Diagnosis not present

## 2020-04-11 LAB — COMPREHENSIVE METABOLIC PANEL
ALT: 18 U/L (ref 0–44)
AST: 15 U/L (ref 15–41)
Albumin: 2.9 g/dL — ABNORMAL LOW (ref 3.5–5.0)
Alkaline Phosphatase: 46 U/L (ref 38–126)
Anion gap: 8 (ref 5–15)
BUN: 6 mg/dL (ref 6–20)
CO2: 25 mmol/L (ref 22–32)
Calcium: 9.3 mg/dL (ref 8.9–10.3)
Chloride: 102 mmol/L (ref 98–111)
Creatinine, Ser: 0.59 mg/dL (ref 0.44–1.00)
GFR, Estimated: >60 mL/min (ref >60)
Glucose, Bld: 100 mg/dL — ABNORMAL HIGH (ref 70–99)
Potassium: 3.8 mmol/L (ref 3.5–5.1)
Sodium: 135 mmol/L (ref 135–145)
Total Bilirubin: 0.4 mg/dL (ref 0.3–1.2)
Total Protein: 6.7 g/dL (ref 6.5–8.1)

## 2020-04-11 LAB — URINALYSIS, ROUTINE W REFLEX MICROSCOPIC
Bacteria, UA: NONE SEEN
Bilirubin Urine: NEGATIVE
Glucose, UA: NEGATIVE mg/dL
Hgb urine dipstick: NEGATIVE
Ketones, ur: 5 mg/dL — AB
Nitrite: NEGATIVE
Protein, ur: 30 mg/dL — AB
Specific Gravity, Urine: 1.021 (ref 1.005–1.030)
pH: 5 (ref 5.0–8.0)

## 2020-04-11 LAB — CBC WITH DIFFERENTIAL/PLATELET
Abs Immature Granulocytes: 0.03 10*3/uL (ref 0.00–0.07)
Basophils Absolute: 0 10*3/uL (ref 0.0–0.1)
Basophils Relative: 0 %
Eosinophils Absolute: 0.2 10*3/uL (ref 0.0–0.5)
Eosinophils Relative: 2 %
HCT: 32.7 % — ABNORMAL LOW (ref 36.0–46.0)
Hemoglobin: 11.5 g/dL — ABNORMAL LOW (ref 12.0–15.0)
Immature Granulocytes: 0 %
Lymphocytes Relative: 25 %
Lymphs Abs: 2 10*3/uL (ref 0.7–4.0)
MCH: 33.3 pg (ref 26.0–34.0)
MCHC: 35.2 g/dL (ref 30.0–36.0)
MCV: 94.8 fL (ref 80.0–100.0)
Monocytes Absolute: 0.6 10*3/uL (ref 0.1–1.0)
Monocytes Relative: 7 %
Neutro Abs: 5.3 10*3/uL (ref 1.7–7.7)
Neutrophils Relative %: 66 %
Platelets: 215 10*3/uL (ref 150–400)
RBC: 3.45 MIL/uL — ABNORMAL LOW (ref 3.87–5.11)
RDW: 11.6 % (ref 11.5–15.5)
WBC: 8.1 10*3/uL (ref 4.0–10.5)
nRBC: 0 % (ref 0.0–0.2)

## 2020-04-11 LAB — WET PREP, GENITAL
Sperm: NONE SEEN
Trich, Wet Prep: NONE SEEN
Yeast Wet Prep HPF POC: NONE SEEN

## 2020-04-11 LAB — POCT PREGNANCY, URINE: Preg Test, Ur: POSITIVE — AB

## 2020-04-11 LAB — LIPASE, BLOOD: Lipase: 24 U/L (ref 11–51)

## 2020-04-11 MED ORDER — METRONIDAZOLE 500 MG PO TABS: 500.0000 mg | ORAL_TABLET | Freq: Two times a day (BID) | ORAL | 0 refills | Status: DC

## 2020-04-11 MED ORDER — LACTATED RINGERS IV BOLUS
1000.0000 mL | Freq: Once | INTRAVENOUS | Status: AC
  Administered 2020-04-11: 1000 mL via INTRAVENOUS

## 2020-04-11 MED ORDER — DEXAMETHASONE SODIUM PHOSPHATE 10 MG/ML IJ SOLN
10.0000 mg | Freq: Once | INTRAMUSCULAR | Status: AC
  Administered 2020-04-11: 10 mg via INTRAVENOUS
  Filled 2020-04-11: qty 1

## 2020-04-11 MED ORDER — METOCLOPRAMIDE HCL 10 MG PO TABS: 10.0000 mg | ORAL_TABLET | Freq: Three times a day (TID) | ORAL | 0 refills | Status: DC | PRN

## 2020-04-11 MED ORDER — DIPHENHYDRAMINE HCL 50 MG/ML IJ SOLN
25.0000 mg | Freq: Once | INTRAMUSCULAR | Status: AC
  Administered 2020-04-11: 25 mg via INTRAVENOUS
  Filled 2020-04-11: qty 1

## 2020-04-11 MED ORDER — CYCLOBENZAPRINE HCL 5 MG PO TABS
10.0000 mg | ORAL_TABLET | Freq: Once | ORAL | Status: AC
  Administered 2020-04-11: 10 mg via ORAL

## 2020-04-11 MED ORDER — METOCLOPRAMIDE HCL 5 MG/ML IJ SOLN
10.0000 mg | Freq: Once | INTRAMUSCULAR | Status: AC
  Administered 2020-04-11: 10 mg via INTRAVENOUS
  Filled 2020-04-11: qty 2

## 2020-04-11 MED ORDER — ACETAMINOPHEN 500 MG PO TABS
1000.0000 mg | ORAL_TABLET | Freq: Once | ORAL | Status: AC
  Administered 2020-04-11: 1000 mg via ORAL

## 2020-04-11 MED ORDER — CYCLOBENZAPRINE HCL 5 MG PO TABS: 5.0000 mg | ORAL_TABLET | Freq: Three times a day (TID) | ORAL | 0 refills | Status: DC | PRN

## 2020-04-11 NOTE — Discharge Instructions (Signed)
For prevention of migraines in pregnancy: -Magnesium, 400mg  by mouth, once daily -Vitamin B2, 400mg  by mouth, once daily  For treatment of migraines in pregnancy: -take medication at the first sign of the pain of a headache, or the first sign of your aura -start with 1000mg  Tylenol (do not exceed 4000mg  of Tylenol in 24hrs), with or without Reglan 10mg  -if no relief after 1-2hours, can take Flexeril 10mg  -if the above regimen does not resolve your headache at all, please come to MAU for additional treatment     Bacterial Vaginosis  Bacterial vaginosis is a vaginal infection that occurs when the normal balance of bacteria in the vagina is disrupted. It results from an overgrowth of certain bacteria. This is the most common vaginal infection among women ages 18-44. Because bacterial vaginosis increases your risk for STIs (sexually transmitted infections), getting treated can help reduce your risk for chlamydia, gonorrhea, herpes, and HIV (human immunodeficiency virus). Treatment is also important for preventing complications in pregnant women, because this condition can cause an early (premature) delivery. What are the causes? This condition is caused by an increase in harmful bacteria that are normally present in small amounts in the vagina. However, the reason that the condition develops is not fully understood. What increases the risk? The following factors may make you more likely to develop this condition:  Having a new sexual partner or multiple sexual partners.  Having unprotected sex.  Douching.  Having an intrauterine device (IUD).  Smoking.  Drug and alcohol abuse.  Taking certain antibiotic medicines.  Being pregnant. You cannot get bacterial vaginosis from toilet seats, bedding, swimming pools, or contact with objects around you. What are the signs or symptoms? Symptoms of this condition include:  Grey or white vaginal discharge. The discharge can also be watery or  foamy.  A fish-like odor with discharge, especially after sexual intercourse or during menstruation.  Itching in and around the vagina.  Burning or pain with urination. Some women with bacterial vaginosis have no signs or symptoms. How is this diagnosed? This condition is diagnosed based on:  Your medical history.  A physical exam of the vagina.  Testing a sample of vaginal fluid under a microscope to look for a large amount of bad bacteria or abnormal cells. Your health care provider may use a cotton swab or a small wooden spatula to collect the sample. How is this treated? This condition is treated with antibiotics. These may be given as a pill, a vaginal cream, or a medicine that is put into the vagina (suppository). If the condition comes back after treatment, a second round of antibiotics may be needed. Follow these instructions at home: Medicines  Take over-the-counter and prescription medicines only as told by your health care provider.  Take or use your antibiotic as told by your health care provider. Do not stop taking or using the antibiotic even if you start to feel better. General instructions  If you have a female sexual partner, tell her that you have a vaginal infection. She should see her health care provider and be treated if she has symptoms. If you have a female sexual partner, he does not need treatment.  During treatment: ? Avoid sexual activity until you finish treatment. ? Do not douche. ? Avoid alcohol as directed by your health care provider. ? Avoid breastfeeding as directed by your health care provider.  Drink enough water and fluids to keep your urine clear or pale yellow.  Keep the area around your  vagina and rectum clean. ? Wash the area daily with warm water. ? Wipe yourself from front to back after using the toilet.  Keep all follow-up visits as told by your health care provider. This is important. How is this prevented?  Do not douche.  Wash  the outside of your vagina with warm water only.  Use protection when having sex. This includes latex condoms and dental dams.  Limit how many sexual partners you have. To help prevent bacterial vaginosis, it is best to have sex with just one partner (monogamous).  Make sure you and your sexual partner are tested for STIs.  Wear cotton or cotton-lined underwear.  Avoid wearing tight pants and pantyhose, especially during summer.  Limit the amount of alcohol that you drink.  Do not use any products that contain nicotine or tobacco, such as cigarettes and e-cigarettes. If you need help quitting, ask your health care provider.  Do not use illegal drugs. Where to find more information  Centers for Disease Control and Prevention: SolutionApps.co.za  American Sexual Health Association (ASHA): www.ashastd.org  U.S. Department of Health and Health and safety inspector, Office on Women's Health: ConventionalMedicines.si or http://www.anderson-williamson.info/ Contact a health care provider if:  Your symptoms do not improve, even after treatment.  You have more discharge or pain when urinating.  You have a fever.  You have pain in your abdomen.  You have pain during sex.  You have vaginal bleeding between periods. Summary  Bacterial vaginosis is a vaginal infection that occurs when the normal balance of bacteria in the vagina is disrupted.  Because bacterial vaginosis increases your risk for STIs (sexually transmitted infections), getting treated can help reduce your risk for chlamydia, gonorrhea, herpes, and HIV (human immunodeficiency virus). Treatment is also important for preventing complications in pregnant women, because the condition can cause an early (premature) delivery.  This condition is treated with antibiotic medicines. These may be given as a pill, a vaginal cream, or a medicine that is put into the vagina (suppository). This information is not intended to replace  advice given to you by your health care provider. Make sure you discuss any questions you have with your health care provider. Document Revised: 04/27/2017 Document Reviewed: 01/29/2016 Elsevier Patient Education  2020 [employer].

## 2020-04-11 NOTE — MAU Provider Note (Signed)
History     CSN: [id number]  Arrival date and time: 04/11/20 1452   First Provider Initiated Contact with Patient 04/11/20 1555      Chief Complaint  Patient presents with  . Abdominal Pain  . Headache   HPI Joy Hartman is a 29 y.o. G3P1011 at [redacted]w[redacted]d who presents with abdominal pain & headache. Both symptoms started last Tuesday.  Reports sharp, cramping pain throughout her abdomen that is constant. Nothing makes better or worse. Denies heartburn, n/v/d, constipation, dysuria, vaginal bleeding, vaginal discharge, or LOF.  Had headache prior to pregnancy that would resolve without treatment. Currently reports a constant frontal headache since Tuesday. Has been taking tylenol daily without relief. Last took tylenol yesterday - takes 2 ES at a time. Headache worse with lights & movement. No associated symptoms.  Goes to [facility] ob/gyn & has appointment with them this Thursday.   Location: abdomen Quality: cramping, sharp Severity: 8/10 on pain scale Duration: 6 days Timing: constant Modifying factors: none Associated signs and symptoms: none  Location: frontal headache Quality: aching Severity: 10/10 on pain scale Duration: 6 days Timing: constant Modifying factors: worse with lights & movement. Not improved with tylenol Associated signs and symptoms: none    OB History    Gravida  3   Para  1   Term  1   Preterm  0   AB  1   Living  1     SAB  1   TAB  0   Ectopic  0   Multiple  0   Live Births  1           Past Medical History:  Diagnosis Date  . Asthma   . Chlamydia   . Gonorrhea   . Panic attack   . Trichomonas     History reviewed. No pertinent surgical history.  Family History  Problem Relation Age of Onset  . Asthma Mother   . Hypertension Mother   . Hypertension Father   . Hypertension Maternal Grandmother   . Hypertension Maternal Grandfather   . Hypertension Paternal Grandmother   . Hypertension Paternal  Grandfather     Social History   Tobacco Use  . Smoking status: Never Smoker  . Smokeless tobacco: Never Used  Vaping Use  . Vaping Use: Never used  Substance Use Topics  . Alcohol use: No  . Drug use: Not Currently    Types: Marijuana    Allergies:  Allergies  Allergen Reactions  . Hydrocodone Itching and Swelling    Itching and swelling    Medications Prior to Admission  Medication Sig Dispense Refill Last Dose  . Prenatal Vit-Fe Fumarate-FA (PRENATAL MULTIVITAMIN) TABS tablet Take 1 tablet by mouth daily at 12 noon.   04/11/2020 at Unknown time  . albuterol (PROVENTIL HFA;VENTOLIN HFA) 108 (90 Base) MCG/ACT inhaler Inhale 1-2 puffs into the lungs every 6 (six) hours as needed for wheezing or shortness of breath. (Patient not taking: Reported on 09/19/2018) 1 Inhaler 1   . cetirizine (ZYRTEC) 5 MG tablet Take 1 tablet (5 mg total) by mouth daily. 30 tablet 0     Review of Systems  Constitutional: Negative.   Eyes: Positive for photophobia.  Gastrointestinal: Positive for abdominal pain. Negative for constipation, diarrhea, nausea and vomiting.  Genitourinary: Negative.   Neurological: Positive for headaches.   Physical Exam   Blood pressure 128/76, pulse (!) 103, temperature 98.8 F (37.1 C), temperature source Oral, resp. rate 18, last menstrual period  12/22/2019, SpO2 100 %, unknown if currently breastfeeding.  Physical Exam Vitals and nursing note reviewed. Exam conducted with a chaperone present.  Constitutional:      General: She is not in acute distress.    Appearance: She is well-developed.  HENT:     Head: Normocephalic and atraumatic.  Pulmonary:     Effort: Pulmonary effort is normal. No respiratory distress.  Abdominal:     Palpations: Abdomen is soft.     Tenderness: There is no abdominal tenderness. There is no guarding or rebound.  Genitourinary:    General: Normal vulva.     Exam position: Lithotomy position.     Comments: Dilation:  Closed Effacement (%): Thick Cervical Position: Posterior Station: -3 Exam by:: Judeth Horn NP  Neurological:     Mental Status: She is alert.     MAU Course  Procedures Results for orders placed or performed during the hospital encounter of 04/11/20 (from the past 24 hour(s))  Pregnancy, urine POC     Status: Abnormal   Collection Time: 04/11/20  3:05 PM  Result Value Ref Range   Preg Test, Ur POSITIVE (A) NEGATIVE  Urinalysis, Routine w reflex microscopic     Status: Abnormal   Collection Time: 04/11/20  3:05 PM  Result Value Ref Range   Color, Urine AMBER (A) YELLOW   APPearance CLOUDY (A) CLEAR   Specific Gravity, Urine 1.021 1.005 - 1.030   pH 5.0 5.0 - 8.0   Glucose, UA NEGATIVE NEGATIVE mg/dL   Hgb urine dipstick NEGATIVE NEGATIVE   Bilirubin Urine NEGATIVE NEGATIVE   Ketones, ur 5 (A) NEGATIVE mg/dL   Protein, ur 30 (A) NEGATIVE mg/dL   Nitrite NEGATIVE NEGATIVE   Leukocytes,Ua MODERATE (A) NEGATIVE   RBC / HPF 0-5 0 - 5 RBC/hpf   WBC, UA 21-50 0 - 5 WBC/hpf   Bacteria, UA NONE SEEN NONE SEEN   Squamous Epithelial / LPF 21-50 0 - 5   Mucus PRESENT   Wet prep, genital     Status: Abnormal   Collection Time: 04/11/20  4:19 PM  Result Value Ref Range   Yeast Wet Prep HPF POC NONE SEEN NONE SEEN   Trich, Wet Prep NONE SEEN NONE SEEN   Clue Cells Wet Prep HPF POC PRESENT (A) NONE SEEN   WBC, Wet Prep HPF POC MODERATE (A) NONE SEEN   Sperm NONE SEEN   CBC with Differential/Platelet     Status: Abnormal   Collection Time: 04/11/20  4:44 PM  Result Value Ref Range   WBC 8.1 4.0 - 10.5 K/uL   RBC 3.45 (L) 3.87 - 5.11 MIL/uL   Hemoglobin 11.5 (L) 12.0 - 15.0 g/dL   HCT 74.1 (L) 36 - 46 %   MCV 94.8 80.0 - 100.0 fL   MCH 33.3 26.0 - 34.0 pg   MCHC 35.2 30.0 - 36.0 g/dL   RDW 63.8 45.3 - 64.6 %   Platelets 215 150 - 400 K/uL   nRBC 0.0 0.0 - 0.2 %   Neutrophils Relative % 66 %   Neutro Abs 5.3 1.7 - 7.7 K/uL   Lymphocytes Relative 25 %   Lymphs Abs 2.0 0.7 -  4.0 K/uL   Monocytes Relative 7 %   Monocytes Absolute 0.6 0.1 - 1.0 K/uL   Eosinophils Relative 2 %   Eosinophils Absolute 0.2 0.0 - 0.5 K/uL   Basophils Relative 0 %   Basophils Absolute 0.0 0.0 - 0.1 K/uL   Immature Granulocytes 0 %  Abs Immature Granulocytes 0.03 0.00 - 0.07 K/uL  Comprehensive metabolic panel     Status: Abnormal   Collection Time: 04/11/20  4:44 PM  Result Value Ref Range   Sodium 135 135 - 145 mmol/L   Potassium 3.8 3.5 - 5.1 mmol/L   Chloride 102 98 - 111 mmol/L   CO2 25 22 - 32 mmol/L   Glucose, Bld 100 (H) 70 - 99 mg/dL   BUN 6 6 - 20 mg/dL   Creatinine, Ser 8.76 0.44 - 1.00 mg/dL   Calcium 9.3 8.9 - 81.1 mg/dL   Total Protein 6.7 6.5 - 8.1 g/dL   Albumin 2.9 (L) 3.5 - 5.0 g/dL   AST 15 15 - 41 U/L   ALT 18 0 - 44 U/L   Alkaline Phosphatase 46 38 - 126 U/L   Total Bilirubin 0.4 0.3 - 1.2 mg/dL   GFR, Estimated >57 >26 mL/min   Anion gap 8 5 - 15  Lipase, blood     Status: None   Collection Time: 04/11/20  4:44 PM  Result Value Ref Range   Lipase 24 11 - 51 U/L    MDM FHT present via doppler  Abdominal pain for nearly a week. Abdomen soft & non tender. No fever. Cervix closed/thick. U/a with moderate leuks, no urinary symptoms - will send for culture. Wet prep & GC/CT collected - wet prep positive for clue cells - will tx with flagyl.   Headache for a week. Started with tylenol & flexeril - no improvement in symptoms (pt on phone & watching TV). Will proceed with IV headache cocktail (decadron, reglan, benadryl) and instructed patient to turn off screens until reassessment.  Patient resting with eyes closed after IV headache cocktail. Will send home with rx reglan & flexeril for future headaches.   Assessment and Plan   1. Pregnancy headache in second trimester   2. Abdominal pain during pregnancy in second trimester   3. Bacterial vaginosis   4. [redacted] weeks gestation of pregnancy    -Rx flagyl -Rx reglan & flexeril for future headaches with  instructions on use -Discussed reasons to return to MAU -Urine culture & GC/CT pending  Judeth Horn 04/11/2020, 7:09 PM

## 2020-04-11 NOTE — MAU Note (Signed)
Joy Hartman is a 29 y.o. at [redacted]w[redacted]d here in MAU reporting: abdominal pain since Tuesday and a headache since Wednesday. Has tried tylenol with no relief. Denies bleeding, LOF, and discharge.  Onset of complaint: ongoing  Pain score: headache 10/10, abdominal pain 8/10  Vitals:   04/11/20 1526  BP: (!) 148/78  Pulse: (!) 114  Resp: 16  Temp: 98.8 F (37.1 C)  SpO2: 95%     Lab orders placed from triage: UA, UPT

## 2020-04-12 LAB — GC/CHLAMYDIA PROBE AMP (~~LOC~~) NOT AT ARMC
Chlamydia: NEGATIVE
Comment: NEGATIVE
Comment: NORMAL
Neisseria Gonorrhea: NEGATIVE

## 2020-04-13 LAB — CULTURE, OB URINE

## 2020-05-29 DIAGNOSIS — Z9289 Personal history of other medical treatment: Secondary | ICD-10-CM

## 2020-05-29 HISTORY — DX: Personal history of other medical treatment: Z92.89

## 2020-05-29 NOTE — L&D Delivery Note (Addendum)
Patient arrived in MAU at 7-8cm.  Reports history of precipitous delivery.  Provider remains at bedside and patient transferred to L&D.  Dr. Claiborne Billings notified of patient arrival, status, and history by J. Reece Packer, Charity fundraiser.  Upon arrival to L&D room, patient with urge to push and exam shows bulging bag from introitus that ruptured spontaneously.  Patient delivered shortly after with staff and family support.   Delivery Note At 0742 on September 25, 2020 a viable female infant "Joy Hartman" was delivered via  (Presentation: Right Occiput Anterior). After  After delivery of head, nuchal cord noted that shoulders and body was delivered through via somersault maneuver.  Infant with good tone and heart rate, but no spontaneous cry.  Cord cut after ~ 30 seconds and infant placed on mother abdomen where nurses provided tactile stimulation and light bulb suction.   APGAR: 7, 9; weight  .  Placenta remained in utero and patient stable.  1657 Dr. Delray Alt arrived at bedside and assumes care of patient.    Anesthesia:   Episiotomy:   Lacerations:   Suture Repair: Unknown Est. Blood Loss (mL):  Pending  Cherre Robins MSN, CNM Advanced Practice Provider, [facility] 09/25/2020, 7:51 AM  While en route to hospital received call that patient delivered spontaneously.  Upon arrival <31min later advised placenta in situ, no pitocin started.  Advised nurse to start pitocin and placenta delivered intact, fundus firm. 1st degree repaired with 3-0 vicryl and left labial tear not bleeding so left alone.  EBL approx 200, although at time of placenta delivery probably about 300cc clot delivered behind placenta. Baby doing well on mom's chest.  Bleeding not  Excessive.  Advised by RN Ampicillin ordered but NOT yet administered prior to delivery.

## 2020-06-02 ENCOUNTER — Inpatient Hospital Stay (HOSPITAL_COMMUNITY)
Admission: AD | Admit: 2020-06-02 | Discharge: 2020-06-02 | Disposition: A | Discharge status: Home / Self Care | Payer: Medicaid Other | Attending: Obstetrics & Gynecology | Admitting: Obstetrics & Gynecology

## 2020-06-02 ENCOUNTER — Inpatient Hospital Stay (HOSPITAL_COMMUNITY): Payer: Medicaid Other

## 2020-06-02 ENCOUNTER — Encounter (HOSPITAL_COMMUNITY): Payer: Self-pay | Admitting: Obstetrics & Gynecology

## 2020-06-02 DIAGNOSIS — R000 Tachycardia, unspecified: Secondary | ICD-10-CM | POA: Diagnosis not present

## 2020-06-02 DIAGNOSIS — R42 Dizziness and giddiness: Secondary | ICD-10-CM | POA: Diagnosis not present

## 2020-06-02 DIAGNOSIS — U071 COVID-19: Secondary | ICD-10-CM

## 2020-06-02 DIAGNOSIS — R112 Nausea with vomiting, unspecified: Secondary | ICD-10-CM | POA: Diagnosis present

## 2020-06-02 DIAGNOSIS — R519 Headache, unspecified: Secondary | ICD-10-CM | POA: Diagnosis not present

## 2020-06-02 DIAGNOSIS — R531 Weakness: Secondary | ICD-10-CM | POA: Diagnosis not present

## 2020-06-02 DIAGNOSIS — Z885 Allergy status to narcotic agent status: Secondary | ICD-10-CM | POA: Insufficient documentation

## 2020-06-02 DIAGNOSIS — R059 Cough, unspecified: Secondary | ICD-10-CM | POA: Diagnosis not present

## 2020-06-02 DIAGNOSIS — Z3A23 23 weeks gestation of pregnancy: Secondary | ICD-10-CM

## 2020-06-02 DIAGNOSIS — R0602 Shortness of breath: Secondary | ICD-10-CM | POA: Diagnosis not present

## 2020-06-02 DIAGNOSIS — R110 Nausea: Secondary | ICD-10-CM | POA: Diagnosis not present

## 2020-06-02 DIAGNOSIS — R509 Fever, unspecified: Secondary | ICD-10-CM | POA: Diagnosis not present

## 2020-06-02 DIAGNOSIS — O98512 Other viral diseases complicating pregnancy, second trimester: Secondary | ICD-10-CM

## 2020-06-02 LAB — CBC WITH DIFFERENTIAL/PLATELET
Abs Immature Granulocytes: 0.05 10*3/uL (ref 0.00–0.07)
Basophils Absolute: 0 10*3/uL (ref 0.0–0.1)
Basophils Relative: 0 %
Eosinophils Absolute: 0.1 10*3/uL (ref 0.0–0.5)
Eosinophils Relative: 2 %
HCT: 30.6 % — ABNORMAL LOW (ref 36.0–46.0)
Hemoglobin: 10.9 g/dL — ABNORMAL LOW (ref 12.0–15.0)
Immature Granulocytes: 1 %
Lymphocytes Relative: 9 %
Lymphs Abs: 0.6 10*3/uL — ABNORMAL LOW (ref 0.7–4.0)
MCH: 34.2 pg — ABNORMAL HIGH (ref 26.0–34.0)
MCHC: 35.6 g/dL (ref 30.0–36.0)
MCV: 95.9 fL (ref 80.0–100.0)
Monocytes Absolute: 0.7 10*3/uL (ref 0.1–1.0)
Monocytes Relative: 12 %
Neutro Abs: 4.8 10*3/uL (ref 1.7–7.7)
Neutrophils Relative %: 76 %
Platelets: 177 10*3/uL (ref 150–400)
RBC: 3.19 MIL/uL — ABNORMAL LOW (ref 3.87–5.11)
RDW: 12.1 % (ref 11.5–15.5)
WBC: 6.3 10*3/uL (ref 4.0–10.5)
nRBC: 0 % (ref 0.0–0.2)

## 2020-06-02 LAB — BASIC METABOLIC PANEL
Anion gap: 9 (ref 5–15)
BUN: 5 mg/dL — ABNORMAL LOW (ref 6–20)
CO2: 20 mmol/L — ABNORMAL LOW (ref 22–32)
Calcium: 8.9 mg/dL (ref 8.9–10.3)
Chloride: 105 mmol/L (ref 98–111)
Creatinine, Ser: 0.49 mg/dL (ref 0.44–1.00)
GFR, Estimated: >60 mL/min (ref >60)
Glucose, Bld: 92 mg/dL (ref 70–99)
Potassium: 3.7 mmol/L (ref 3.5–5.1)
Sodium: 134 mmol/L — ABNORMAL LOW (ref 135–145)

## 2020-06-02 LAB — RESP PANEL BY RT-PCR (FLU A&B, COVID) ARPGX2
Influenza A by PCR: NEGATIVE
Influenza B by PCR: NEGATIVE
SARS Coronavirus 2 by RT PCR: POSITIVE — AB

## 2020-06-02 LAB — D-DIMER, QUANTITATIVE: D-Dimer, Quant: 1.53 ug/mL-FEU — ABNORMAL HIGH (ref 0.00–0.50)

## 2020-06-02 LAB — C-REACTIVE PROTEIN: CRP: 3.9 mg/dL — ABNORMAL HIGH (ref <1.0)

## 2020-06-02 MED ORDER — LACTATED RINGERS IV BOLUS
1000.0000 mL | Freq: Once | INTRAVENOUS | Status: AC
  Administered 2020-06-02: 1000 mL via INTRAVENOUS

## 2020-06-02 MED ORDER — ALBUTEROL SULFATE HFA 108 (90 BASE) MCG/ACT IN AERS: 2.0000 | INHALATION_SPRAY | RESPIRATORY_TRACT | 2 refills | Status: DC | PRN

## 2020-06-02 MED ORDER — ONDANSETRON HCL 4 MG PO TABS: 4.0000 mg | ORAL_TABLET | Freq: Every day | ORAL | 1 refills | Status: DC | PRN

## 2020-06-02 MED ORDER — ONDANSETRON HCL 4 MG/2ML IJ SOLN
4.0000 mg | Freq: Once | INTRAMUSCULAR | Status: AC
  Administered 2020-06-02: 4 mg via INTRAVENOUS
  Filled 2020-06-02: qty 2

## 2020-06-02 MED ORDER — ALBUTEROL SULFATE HFA 108 (90 BASE) MCG/ACT IN AERS
2.0000 | INHALATION_SPRAY | RESPIRATORY_TRACT | Status: DC | PRN
  Administered 2020-06-02: 2 via RESPIRATORY_TRACT
  Filled 2020-06-02: qty 6.7

## 2020-06-02 MED ORDER — ACETAMINOPHEN 500 MG PO TABS
1000.0000 mg | ORAL_TABLET | ORAL | Status: AC
  Administered 2020-06-02: 1000 mg via ORAL
  Filled 2020-06-02: qty 2

## 2020-06-02 MED ORDER — FLUTICASONE PROPIONATE 50 MCG/ACT NA SUSP: 1.0000 | Freq: Every day | NASAL | 0 refills | Status: DC

## 2020-06-02 NOTE — MAU Note (Signed)
Pt reports feeling fatigue, HA, SOB, fever (100 06/01/2020), decreased appetite. No N/V. Symptoms started 05/31/2020. Known contact with father who is Covid+ last few days. Pt reports +FM, denies Ctx, LOF, and vaginal bleeding.

## 2020-06-02 NOTE — MAU Provider Note (Signed)
History     CSN: [id number]  Arrival date and time: 06/02/20 0749   None     Chief Complaint  Patient presents with  . Nausea  . Headache   HPI Presents via EMS for nausea, dizziness, flu like symptoms. Endorses cough that has worsened, shortness of breath, chest pain. History of asthma and can't find albuterol inhaler at home. Not on maintenance meds. Feeling feverish at home, reports highest temp at home 100. Feeling nauseous but not vomiting. No diarrhea. No abdominal pain.  Not Covid vaccinated. Father has covid and has been around him.  Reports good fetal movement, no vaginal bleeding or leakage of fluid. No cramping or back pain.   Reports chest pain and chest tightness. Reports cough and runny nose.     OB History    Gravida  3   Para  1   Term  1   Preterm  0   AB  1   Living  1     SAB  1   IAB  0   Ectopic  0   Multiple  0   Live Births  1           Past Medical History:  Diagnosis Date  . Asthma   . Chlamydia   . Gonorrhea   . Panic attack   . Trichomonas     History reviewed. No pertinent surgical history.  Family History  Problem Relation Age of Onset  . Asthma Mother   . Hypertension Mother   . Hypertension Father   . Hypertension Maternal Grandmother   . Hypertension Maternal Grandfather   . Hypertension Paternal Grandmother   . Hypertension Paternal Grandfather     Social History   Tobacco Use  . Smoking status: Never Smoker  . Smokeless tobacco: Never Used  Vaping Use  . Vaping Use: Never used  Substance Use Topics  . Alcohol use: No  . Drug use: Not Currently    Types: Marijuana    Allergies:  Allergies  Allergen Reactions  . Hydrocodone Itching and Swelling    Itching and swelling    Medications Prior to Admission  Medication Sig Dispense Refill Last Dose  . acetaminophen (TYLENOL) 500 MG tablet Take 1,000 mg by mouth every 6 (six) hours as needed.   06/01/2020 at 1700  . metoCLOPramide (REGLAN) 10 MG  tablet Take 1 tablet (10 mg total) by mouth every 8 (eight) hours as needed for nausea. 30 tablet 0 06/01/2020 at Unknown time  . Prenatal Vit-Fe Fumarate-FA (PRENATAL MULTIVITAMIN) TABS tablet Take 1 tablet by mouth daily at 12 noon.   06/01/2020 at Unknown time  . cyclobenzaprine (FLEXERIL) 5 MG tablet Take 1 tablet (5 mg total) by mouth 3 (three) times daily as needed for muscle spasms. 20 tablet 0   . metroNIDAZOLE (FLAGYL) 500 MG tablet Take 1 tablet (500 mg total) by mouth 2 (two) times daily. 14 tablet 0     Review of Systems  Constitutional: Positive for chills.  HENT: Positive for congestion.   Respiratory: Positive for shortness of breath.   Cardiovascular: Positive for chest pain.  Gastrointestinal: Positive for nausea. Negative for diarrhea and vomiting.  Neurological: Positive for dizziness and headaches.   Physical Exam   Temperature 99.5 F (37.5 C), temperature source Oral, resp. rate 16, last menstrual period 12/22/2019, unknown if currently breastfeeding.O2 Sat 99%   Physical Exam Vitals and nursing note reviewed.  Constitutional:      Appearance: She is well-developed.  Cardiovascular:     Rate and Rhythm: Tachycardia present.  Pulmonary:     Effort: Pulmonary effort is normal.     Breath sounds: Normal breath sounds. No wheezing.     Comments: Coarse cough noted Skin:    General: Skin is warm and dry.  Neurological:     Mental Status: She is alert.  Psychiatric:        Mood and Affect: Mood normal.        Behavior: Behavior normal.    FHR 156  MAU Course  Procedures  MDM CBC, CMP unremarkable, Oxygen saturation 99%% with talking, coughing, movement  Tylenol, zofran given  EKG - normal  CXR - normal  Feeling improved.   Assessment and Plan   Covid-19 in Second Trimester -discussed self care at home and isolation protocol, provided w information handouts -script given for refill on albuterol inhaler, flonase, tylenol, zofran  -stable for d/c home     Gita Kudo 06/02/2020, 8:15 AM

## 2020-07-13 ENCOUNTER — Inpatient Hospital Stay (HOSPITAL_COMMUNITY)
Admission: AD | Admit: 2020-07-13 | Discharge: 2020-07-13 | Disposition: A | Discharge status: Home / Self Care | Payer: Medicaid Other | Attending: Obstetrics and Gynecology | Admitting: Obstetrics and Gynecology

## 2020-07-13 ENCOUNTER — Encounter (HOSPITAL_COMMUNITY): Payer: Self-pay | Admitting: Obstetrics and Gynecology

## 2020-07-13 ENCOUNTER — Other Ambulatory Visit: Payer: Self-pay

## 2020-07-13 DIAGNOSIS — R519 Headache, unspecified: Secondary | ICD-10-CM | POA: Diagnosis not present

## 2020-07-13 DIAGNOSIS — Z3689 Encounter for other specified antenatal screening: Secondary | ICD-10-CM

## 2020-07-13 DIAGNOSIS — Z3A29 29 weeks gestation of pregnancy: Secondary | ICD-10-CM

## 2020-07-13 DIAGNOSIS — W19XXXA Unspecified fall, initial encounter: Secondary | ICD-10-CM

## 2020-07-13 DIAGNOSIS — W109XXA Fall (on) (from) unspecified stairs and steps, initial encounter: Secondary | ICD-10-CM | POA: Insufficient documentation

## 2020-07-13 DIAGNOSIS — O99891 Other specified diseases and conditions complicating pregnancy: Secondary | ICD-10-CM | POA: Diagnosis not present

## 2020-07-13 DIAGNOSIS — O26893 Other specified pregnancy related conditions, third trimester: Secondary | ICD-10-CM | POA: Diagnosis not present

## 2020-07-13 DIAGNOSIS — R1011 Right upper quadrant pain: Secondary | ICD-10-CM | POA: Diagnosis not present

## 2020-07-13 DIAGNOSIS — G44209 Tension-type headache, unspecified, not intractable: Secondary | ICD-10-CM

## 2020-07-13 HISTORY — DX: Headache, unspecified: R51.9

## 2020-07-13 LAB — COMPREHENSIVE METABOLIC PANEL
ALT: 12 U/L (ref 0–44)
AST: 14 U/L — ABNORMAL LOW (ref 15–41)
Albumin: 2.7 g/dL — ABNORMAL LOW (ref 3.5–5.0)
Alkaline Phosphatase: 70 U/L (ref 38–126)
Anion gap: 7 (ref 5–15)
BUN: <5 mg/dL — ABNORMAL LOW (ref 6–20)
CO2: 20 mmol/L — ABNORMAL LOW (ref 22–32)
Calcium: 8.6 mg/dL — ABNORMAL LOW (ref 8.9–10.3)
Chloride: 107 mmol/L (ref 98–111)
Creatinine, Ser: 0.42 mg/dL — ABNORMAL LOW (ref 0.44–1.00)
GFR, Estimated: >60 mL/min (ref >60)
Glucose, Bld: 89 mg/dL (ref 70–99)
Potassium: 3.7 mmol/L (ref 3.5–5.1)
Sodium: 134 mmol/L — ABNORMAL LOW (ref 135–145)
Total Bilirubin: 0.1 mg/dL — ABNORMAL LOW (ref 0.3–1.2)
Total Protein: 6.6 g/dL (ref 6.5–8.1)

## 2020-07-13 LAB — CBC
HCT: 30.5 % — ABNORMAL LOW (ref 36.0–46.0)
Hemoglobin: 10.6 g/dL — ABNORMAL LOW (ref 12.0–15.0)
MCH: 32.9 pg (ref 26.0–34.0)
MCHC: 34.8 g/dL (ref 30.0–36.0)
MCV: 94.7 fL (ref 80.0–100.0)
Platelets: 190 10*3/uL (ref 150–400)
RBC: 3.22 MIL/uL — ABNORMAL LOW (ref 3.87–5.11)
RDW: 12.1 % (ref 11.5–15.5)
WBC: 6.9 10*3/uL (ref 4.0–10.5)
nRBC: 0 % (ref 0.0–0.2)

## 2020-07-13 LAB — PROTEIN / CREATININE RATIO, URINE
Creatinine, Urine: 109.43 mg/dL
Protein Creatinine Ratio: 0.11 mg/mg{Cre} (ref 0.00–0.15)
Total Protein, Urine: 12 mg/dL

## 2020-07-13 MED ORDER — DEXAMETHASONE SODIUM PHOSPHATE 10 MG/ML IJ SOLN
10.0000 mg | Freq: Once | INTRAMUSCULAR | Status: AC
  Administered 2020-07-13: 10 mg via INTRAVENOUS
  Filled 2020-07-13: qty 1

## 2020-07-13 MED ORDER — DIPHENHYDRAMINE HCL 50 MG/ML IJ SOLN
25.0000 mg | Freq: Once | INTRAMUSCULAR | Status: AC
  Administered 2020-07-13: 25 mg via INTRAVENOUS
  Filled 2020-07-13: qty 1

## 2020-07-13 MED ORDER — LACTATED RINGERS IV SOLN: Freq: Once | INTRAVENOUS | Status: AC

## 2020-07-13 MED ORDER — METOCLOPRAMIDE HCL 5 MG/ML IJ SOLN
10.0000 mg | Freq: Once | INTRAMUSCULAR | Status: AC
  Administered 2020-07-13: 10 mg via INTRAVENOUS
  Filled 2020-07-13: qty 2

## 2020-07-13 NOTE — Discharge Instructions (Signed)

## 2020-07-13 NOTE — MAU Provider Note (Signed)
History     CSN: [id number]  Arrival date and time: 07/13/20 1120  Event Date/Time  First Provider Initiated Contact with Patient 07/13/20 1205     Chief Complaint  Patient presents with  . Headache  . Fall   HPI Joy Hartman is a 30 y.o. G3P1011 at [redacted]w[redacted]d who presents to MAU with chief complaint of headache. This is a recurrent problem for which patient has been prescribed Fioricet. She states it initially worked really well but is less effective now. She is remote from use. She states she reported her headache to her prenatal care provider and they instructed her to come to MAU for a BP check. Patient denies history of elevated blood pressure. She endorses RUQ pain and bilateral lower extremity swelling.  Patient also c/o fall at home yesterday 07/12/2020 at 10am. She endorses hitting the left side of her pelvic and abdomen on a step. She denies localized pain, bruising, weakness, syncope. She denies vaginal bleeding, decreased fetal movement.  Patient receives care with [facility].  OB History    Gravida  3   Para  1   Term  1   Preterm  0   AB  1   Living  1     SAB  1   IAB  0   Ectopic  0   Multiple  0   Live Births  1           Past Medical History:  Diagnosis Date  . Asthma   . Chlamydia   . Gonorrhea   . Headache   . Panic attack   . Trichomonas     History reviewed. No pertinent surgical history.  Family History  Problem Relation Age of Onset  . Asthma Mother   . Hypertension Mother   . Hypertension Father   . Hypertension Maternal Grandmother   . Hypertension Maternal Grandfather   . Hypertension Paternal Grandmother   . Hypertension Paternal Grandfather     Social History   Tobacco Use  . Smoking status: Never Smoker  . Smokeless tobacco: Never Used  Vaping Use  . Vaping Use: Never used  Substance Use Topics  . Alcohol use: No  . Drug use: Not Currently    Types: Marijuana    Allergies:  Allergies  Allergen  Reactions  . Hydrocodone Itching and Swelling    Itching and swelling    Medications Prior to Admission  Medication Sig Dispense Refill Last Dose  . Prenatal Vit-Fe Fumarate-FA (PRENATAL MULTIVITAMIN) TABS tablet Take 1 tablet by mouth daily at 12 noon.   Past Week at Unknown time  . acetaminophen (TYLENOL) 500 MG tablet Take 1,000 mg by mouth every 6 (six) hours as needed.     Marland Kitchen albuterol (VENTOLIN HFA) 108 (90 Base) MCG/ACT inhaler Inhale 2 puffs into the lungs every 4 (four) hours as needed for wheezing or shortness of breath. 6.7 g 2   . fluticasone (FLONASE) 50 MCG/ACT nasal spray Place 1 spray into both nostrils daily. 1 g 0   . metoCLOPramide (REGLAN) 10 MG tablet Take 1 tablet (10 mg total) by mouth every 8 (eight) hours as needed for nausea. 30 tablet 0   . ondansetron (ZOFRAN) 4 MG tablet Take 1 tablet (4 mg total) by mouth daily as needed for nausea or vomiting. 30 tablet 1     Review of Systems  Constitutional: Positive for fatigue. Negative for fever.  Eyes: Negative for visual disturbance.  Respiratory: Negative for shortness of  breath.   Gastrointestinal: Negative for abdominal pain.  Genitourinary: Negative for vaginal bleeding.  Musculoskeletal: Negative for back pain.  Neurological: Positive for headaches. Negative for syncope and weakness.  All other systems reviewed and are negative.  Physical Exam   Blood pressure 121/71, pulse 87, temperature 98.7 F (37.1 C), resp. rate 17, weight 84.4 kg, last menstrual period 12/22/2019, unknown if currently breastfeeding.  Physical Exam Vitals and nursing note reviewed.  Constitutional:      General: She is not in acute distress.    Appearance: She is obese. She is not ill-appearing.  Cardiovascular:     Rate and Rhythm: Normal rate.     Heart sounds: Normal heart sounds.  Pulmonary:     Effort: Pulmonary effort is normal.     Breath sounds: Normal breath sounds.  Abdominal:     Palpations: Abdomen is soft.      Comments: Gravid  Skin:    General: Skin is warm and dry.     Capillary Refill: Capillary refill takes less than 2 seconds.  Neurological:     Mental Status: She is alert and oriented to person, place, and time.  Psychiatric:        Mood and Affect: Mood normal.        Behavior: Behavior normal.     MAU Course  Procedures  --Patient sleeping after headache cocktail. Woke up when RN entered room and reported pain score of 5/10. --Reactive tracing: baseline 150, mod var, + 10 x 10 accels, no decels. Toco: quiet  --Patient with lengthy history of headaches. Does not have PCP. Offered referral to [facility] to allow her to establish PCP, pursue more intentional assessment and management of headaches, possible subsequent referral to Neurology.  --S/p fall > 24 hours prior to arrival in MAU. Denies abdominal pain, vaginal bleeding, DFM. Reactive tracing. Discussed with Dr. Alvester Morin, who agrees additional surveillance is not indicated  Patient Vitals for the past 24 hrs:  BP Temp Pulse Resp SpO2 Weight  07/13/20 1401 101/63 -- 87 15 -- --  07/13/20 1345 109/68 -- 82 -- 100 % --  07/13/20 1331 112/71 -- 87 -- -- --  07/13/20 1316 115/60 -- 83 -- -- --  07/13/20 1301 120/66 -- 78 -- -- --  07/13/20 1246 120/72 -- 76 -- -- --  07/13/20 1231 117/71 -- 87 -- -- --  07/13/20 1216 118/82 -- 84 -- -- --  07/13/20 1201 127/75 -- 92 -- -- --  07/13/20 1158 128/71 -- 86 -- -- --  07/13/20 1146 121/71 98.7 F (37.1 C) 87 17 -- 84.4 kg   Results for orders placed or performed during the hospital encounter of 07/13/20 (from the past 24 hour(s))  CBC     Status: Abnormal   Collection Time: 07/13/20 12:11 PM  Result Value Ref Range   WBC 6.9 4.0 - 10.5 K/uL   RBC 3.22 (L) 3.87 - 5.11 MIL/uL   Hemoglobin 10.6 (L) 12.0 - 15.0 g/dL   HCT 40.9 (L) 81.1 - 91.4 %   MCV 94.7 80.0 - 100.0 fL   MCH 32.9 26.0 - 34.0 pg   MCHC 34.8 30.0 - 36.0 g/dL   RDW 78.2 95.6 - 21.3 %   Platelets 190 150 - 400  K/uL   nRBC 0.0 0.0 - 0.2 %  Comprehensive metabolic panel     Status: Abnormal   Collection Time: 07/13/20 12:11 PM  Result Value Ref Range   Sodium 134 (L) 135 -  145 mmol/L   Potassium 3.7 3.5 - 5.1 mmol/L   Chloride 107 98 - 111 mmol/L   CO2 20 (L) 22 - 32 mmol/L   Glucose, Bld 89 70 - 99 mg/dL   BUN <5 (L) 6 - 20 mg/dL   Creatinine, Ser 0.35 (L) 0.44 - 1.00 mg/dL   Calcium 8.6 (L) 8.9 - 10.3 mg/dL   Total Protein 6.6 6.5 - 8.1 g/dL   Albumin 2.7 (L) 3.5 - 5.0 g/dL   AST 14 (L) 15 - 41 U/L   ALT 12 0 - 44 U/L   Alkaline Phosphatase 70 38 - 126 U/L   Total Bilirubin 0.1 (L) 0.3 - 1.2 mg/dL   GFR, Estimated >00 >93 mL/min   Anion gap 7 5 - 15  Protein / creatinine ratio, urine     Status: None   Collection Time: 07/13/20 12:15 PM  Result Value Ref Range   Creatinine, Urine 109.43 mg/dL   Total Protein, Urine 12 mg/dL   Protein Creatinine Ratio 0.11 0.00 - 0.15 mg/mg[Cre]   Meds ordered this encounter  Medications  . metoCLOPramide (REGLAN) injection 10 mg  . diphenhydrAMINE (BENADRYL) injection 25 mg  . dexamethasone (DECADRON) injection 10 mg  . lactated ringers infusion   Assessment and Plan  --30 y.o. G3P1011 at [redacted]w[redacted]d  --Reactive tracing --Normotensive throughout --PEC WNL --Headache resolved with IV HA cocktail --S/p fall > 24 hours ago, initial encounter, no acute complaints --Discharge home in stable condition   Calvert Cantor, CNM 07/13/2020, 5:42 PM

## 2020-07-13 NOTE — MAU Note (Addendum)
Patient sent from the office for BP check because she reports a HA.  Took some meds for her HA last night without relief but doesn't remember the name.  Also endorses blurred vision, dizziness & floaters.  Some occasional epigastric pain.  Denies VB/LOF/CTX.  + FM.  Also reports she fell down her stairs around 1000 this morning and fell on her side.  Hit her lower abdomen on one of the steps and hit the back of her head while coming down.  Denies LOC.

## 2020-07-18 ENCOUNTER — Other Ambulatory Visit: Payer: Self-pay

## 2020-07-18 ENCOUNTER — Encounter (HOSPITAL_COMMUNITY): Payer: Self-pay | Admitting: Obstetrics and Gynecology

## 2020-07-18 ENCOUNTER — Observation Stay (HOSPITAL_COMMUNITY)
Admission: AD | Admit: 2020-07-18 | Discharge: 2020-07-19 | Disposition: A | Discharge status: Home / Self Care | Payer: Medicaid Other | Attending: Obstetrics and Gynecology | Admitting: Obstetrics and Gynecology

## 2020-07-18 ENCOUNTER — Inpatient Hospital Stay (HOSPITAL_BASED_OUTPATIENT_CLINIC_OR_DEPARTMENT_OTHER): Payer: Medicaid Other

## 2020-07-18 DIAGNOSIS — R109 Unspecified abdominal pain: Secondary | ICD-10-CM | POA: Diagnosis not present

## 2020-07-18 DIAGNOSIS — O99891 Other specified diseases and conditions complicating pregnancy: Secondary | ICD-10-CM

## 2020-07-18 DIAGNOSIS — J45909 Unspecified asthma, uncomplicated: Secondary | ICD-10-CM

## 2020-07-18 DIAGNOSIS — O4693 Antepartum hemorrhage, unspecified, third trimester: Secondary | ICD-10-CM

## 2020-07-18 DIAGNOSIS — O26899 Other specified pregnancy related conditions, unspecified trimester: Secondary | ICD-10-CM | POA: Diagnosis not present

## 2020-07-18 DIAGNOSIS — O99513 Diseases of the respiratory system complicating pregnancy, third trimester: Secondary | ICD-10-CM | POA: Diagnosis not present

## 2020-07-18 DIAGNOSIS — Z3A29 29 weeks gestation of pregnancy: Secondary | ICD-10-CM | POA: Insufficient documentation

## 2020-07-18 LAB — CBC
HCT: 33 % — ABNORMAL LOW (ref 36.0–46.0)
Hemoglobin: 11.6 g/dL — ABNORMAL LOW (ref 12.0–15.0)
MCH: 33.3 pg (ref 26.0–34.0)
MCHC: 35.2 g/dL (ref 30.0–36.0)
MCV: 94.8 fL (ref 80.0–100.0)
Platelets: 202 10*3/uL (ref 150–400)
RBC: 3.48 MIL/uL — ABNORMAL LOW (ref 3.87–5.11)
RDW: 12.4 % (ref 11.5–15.5)
WBC: 8 10*3/uL (ref 4.0–10.5)
nRBC: 0 % (ref 0.0–0.2)

## 2020-07-18 LAB — WET PREP, GENITAL
Clue Cells Wet Prep HPF POC: NONE SEEN
Sperm: NONE SEEN
Trich, Wet Prep: NONE SEEN
Yeast Wet Prep HPF POC: NONE SEEN

## 2020-07-18 LAB — TYPE AND SCREEN
ABO/RH(D): O POS
Antibody Screen: NEGATIVE

## 2020-07-18 MED ORDER — CALCIUM CARBONATE ANTACID 500 MG PO CHEW: 2.0000 | CHEWABLE_TABLET | ORAL | Status: DC | PRN

## 2020-07-18 MED ORDER — DOCUSATE SODIUM 100 MG PO CAPS
100.0000 mg | ORAL_CAPSULE | Freq: Every day | ORAL | Status: DC
  Administered 2020-07-18 – 2020-07-19 (×2): 100 mg via ORAL
  Filled 2020-07-18 (×2): qty 1

## 2020-07-18 MED ORDER — LACTATED RINGERS IV BOLUS
1000.0000 mL | Freq: Once | INTRAVENOUS | Status: AC
  Administered 2020-07-18: 1000 mL via INTRAVENOUS

## 2020-07-18 MED ORDER — FLEET ENEMA 7-19 GM/118ML RE ENEM: 1.0000 | ENEMA | Freq: Once | RECTAL | Status: DC

## 2020-07-18 MED ORDER — SODIUM CHLORIDE 0.9 % IV SOLN: 250.0000 mL | INTRAVENOUS | Status: DC | PRN

## 2020-07-18 MED ORDER — PRENATAL MULTIVITAMIN CH
1.0000 | ORAL_TABLET | Freq: Every day | ORAL | Status: DC
  Administered 2020-07-19: 1 via ORAL
  Filled 2020-07-18: qty 1

## 2020-07-18 MED ORDER — ACETAMINOPHEN 325 MG PO TABS: 650.0000 mg | ORAL_TABLET | ORAL | Status: DC | PRN

## 2020-07-18 MED ORDER — SODIUM CHLORIDE 0.9% FLUSH
3.0000 mL | INTRAVENOUS | Status: DC | PRN
  Administered 2020-07-19: 3 mL via INTRAVENOUS

## 2020-07-18 MED ORDER — ZOLPIDEM TARTRATE 5 MG PO TABS: 5.0000 mg | ORAL_TABLET | Freq: Every evening | ORAL | Status: DC | PRN

## 2020-07-18 MED ORDER — SODIUM CHLORIDE 0.9% FLUSH: 3.0000 mL | Freq: Two times a day (BID) | INTRAVENOUS | Status: DC

## 2020-07-18 NOTE — MAU Note (Signed)
Pt arrived via EMS with complaint of back pain that started last night and worsened this morning and vaginal bleeding that started about an hour ago. Pain score 10 on 1-10 scale. Pt denies LOF and reports +FM.

## 2020-07-18 NOTE — MAU Provider Note (Addendum)
Patient Joy Hartman is a 30 y.o. G3P1011  at [redacted]w[redacted]d here with complaints of vaginal bleeding and left lower quadrant pain that started this morning at 8 am. She had intercourse at 5-6 am. Prior to today she was feeling fine: no decreased fetal movements, no contractions. She denies vaginal discharge, dysuria or fever, SOB prior to today.   She is a patient at [facility]. Next appt is 07/28/2020.   History     CSN: [id number]  Arrival date and time: 07/18/20 1036   Event Date/Time   First Provider Initiated Contact with Patient 07/18/20 1057      Chief Complaint  Patient presents with  . Abdominal Pain  . Vaginal Bleeding   Abdominal Pain This is a new problem. The current episode started today. The problem occurs intermittently. The quality of the pain is sharp. The abdominal pain radiates to the back. Associated symptoms include constipation. Pertinent negatives include no diarrhea, dysuria, nausea or vomiting. The pain is relieved by sitting up.  Vaginal Bleeding The patient's primary symptoms include vaginal bleeding. This is a new problem. The current episode started today. The problem occurs constantly. Associated symptoms include abdominal pain and constipation. Pertinent negatives include no diarrhea, dysuria, nausea or vomiting. The vaginal discharge was bloody. The vaginal bleeding is heavier than menses. She has been passing clots.    OB History    Gravida  3   Para  1   Term  1   Preterm  0   AB  1   Living  1     SAB  1   IAB  0   Ectopic  0   Multiple  0   Live Births  1           Past Medical History:  Diagnosis Date  . Asthma   . Chlamydia   . Gonorrhea   . Headache   . Panic attack   . Trichomonas     History reviewed. No pertinent surgical history.  Family History  Problem Relation Age of Onset  . Asthma Mother   . Hypertension Mother   . Hypertension Father   . Hypertension Maternal Grandmother   . Hypertension Maternal  Grandfather   . Hypertension Paternal Grandmother   . Hypertension Paternal Grandfather     Social History   Tobacco Use  . Smoking status: Never Smoker  . Smokeless tobacco: Never Used  Vaping Use  . Vaping Use: Never used  Substance Use Topics  . Alcohol use: No  . Drug use: Not Currently    Types: Marijuana    Allergies:  Allergies  Allergen Reactions  . Hydrocodone Itching and Swelling    Itching and swelling    Medications Prior to Admission  Medication Sig Dispense Refill Last Dose  . acetaminophen (TYLENOL) 500 MG tablet Take 1,000 mg by mouth every 6 (six) hours as needed.     Marland Kitchen albuterol (VENTOLIN HFA) 108 (90 Base) MCG/ACT inhaler Inhale 2 puffs into the lungs every 4 (four) hours as needed for wheezing or shortness of breath. 6.7 g 2   . fluticasone (FLONASE) 50 MCG/ACT nasal spray Place 1 spray into both nostrils daily. 1 g 0   . metoCLOPramide (REGLAN) 10 MG tablet Take 1 tablet (10 mg total) by mouth every 8 (eight) hours as needed for nausea. 30 tablet 0   . ondansetron (ZOFRAN) 4 MG tablet Take 1 tablet (4 mg total) by mouth daily as needed for nausea or  vomiting. 30 tablet 1   . Prenatal Vit-Fe Fumarate-FA (PRENATAL MULTIVITAMIN) TABS tablet Take 1 tablet by mouth daily at 12 noon.       Review of Systems  Gastrointestinal: Positive for abdominal pain and constipation. Negative for diarrhea, nausea and vomiting.  Genitourinary: Positive for vaginal bleeding. Negative for dysuria.   Physical Exam   Blood pressure 135/85, pulse (!) 106, temperature 98.5 F (36.9 C), temperature source Oral, resp. rate 20, height 5\' 6"  (1.676 m), weight 84.4 kg, last menstrual period 12/22/2019, SpO2 98 %, unknown if currently breastfeeding.  Physical Exam Constitutional:      Appearance: She is well-developed.  Abdominal:     General: Abdomen is flat.     Tenderness: There is abdominal tenderness in the left lower quadrant.  Genitourinary:    Vagina: Normal.      Cervix: Discharge present.     Comments: NEFG; cervix external os is 3 cm but internal os is closed. No abdominal tenderness. Dark brown blood in the vagina, no clots, no tissue, no odor.  Neurological:     Mental Status: She is alert.     MAU Course  Procedures  MDM -12/24/2019 to assess placenta> no signs of abruption, no previa -IV fluids given -labs drawn to check CBC> 11.6 now -will continue to monitor closely O  pos Patient now sitting up in bed, will continue to monitor bleeding.  Patient reports that when sitting up, her pain is a 6/10.   1300: patient reporting that her left lower quadrant pain has resolved, she mostly feels pain in her lower back. Patient reporting only stomach pain she feels is when baby is moving. She declines flexeril for pain. Checked pad and she has only slight staining on pad.   1439:  Patient reassessed with speculum, still with dark red/brown/bright red vaginal bleeding that continues to trickle; cervix is unchanged. Stool burden noted.   Discussed with Dr. [place], who recommends observation. Discussed with Neonatology, who is agreeable to admission given that delivery does not appear imminent. If patient delivers, infant will need to be transferred to [facility] due to early gestational age. Patient agreeable to admission, understand that there may be a need for transfer.   NST continues to be reassuring: FHR 150-155, mod var, present acel, no decels, uterine irritability.    Assessment and Plan   Dr. [facility] to place orders RN call for room COVID swab deferred as patient had COVID in past 90 days  Tenny Craw [facility] 07/18/2020, 12:01 PM

## 2020-07-18 NOTE — H&P (Signed)
Joy Hartman is a 30 y.o. female presenting for vaginal bleeding  30 yo G3P1011 @ 29+6 presents to MAU by EMS for onset vaginal bleeding after intercourse. Pt reortedly had intercourse at 5-6 am and noted heavy vaginal bleeding around 8. Upon admission to MAU, bleeding was noted to be more brisk. [place] confirmed no previa, cervical length 3.37 cm, no obvious abruption noted. Bleeding has decreased but pt has had a persistent trickle of darker blood that has continued. Therefore the decision was made to admit the patient for 23 hr obs. Fetal tracing has been reassuring. No significant contractions noted. Patient's cervical exam is documented as 3cm, however, the CNM who evaluated the patient indicated that on cervical exam the internal os is closed and the external os was 2-3. OB History    Gravida  3   Para  1   Term  1   Preterm  0   AB  1   Living  1     SAB  1   IAB  0   Ectopic  0   Multiple  0   Live Births  1          Past Medical History:  Diagnosis Date  . Asthma   . Chlamydia   . Gonorrhea   . Headache   . Panic attack   . Trichomonas    History reviewed. No pertinent surgical history. Family History: family history includes Asthma in her mother; Hypertension in her father, maternal grandfather, maternal grandmother, mother, paternal grandfather, and paternal grandmother. Social History:  reports that she has never smoked. She has never used smokeless tobacco. She reports previous drug use. Drug: Marijuana. She reports that she does not drink alcohol.     Maternal Diabetes: No Genetic Screening: Normal Maternal Ultrasounds/Referrals: Normal Fetal Ultrasounds or other Referrals:  None Maternal Substance Abuse:  No Significant Maternal Medications:  none Significant Maternal Lab Results:  None Other Comments:  None  Review of Systems History Dilation: 3 (inner os closed) Effacement (%): Thick Exam by:: Luna Kitchens, CNM Blood pressure 122/71,  pulse 88, temperature 98.3 F (36.8 C), temperature source Oral, resp. rate 17, height 5\' 6"  (1.676 m), weight 84.4 kg, last menstrual period 12/22/2019, SpO2 100 %, unknown if currently breastfeeding. Exam Physical Exam  Prenatal labs: ABO, Rh: --/--/O POS (02/20 1141) Antibody: NEG (02/20 1141) Rubella:  Imm RPR:   NR HBsAg:   Neg HIV:   NR GBS:     Assessment/Plan: 1) Admit 23 hr obs for monitoring   08-03-2002 07/18/2020, 4:08 PM

## 2020-07-19 LAB — CBC
HCT: 27.9 % — ABNORMAL LOW (ref 36.0–46.0)
Hemoglobin: 9.7 g/dL — ABNORMAL LOW (ref 12.0–15.0)
MCH: 33.3 pg (ref 26.0–34.0)
MCHC: 34.8 g/dL (ref 30.0–36.0)
MCV: 95.9 fL (ref 80.0–100.0)
Platelets: 181 10*3/uL (ref 150–400)
RBC: 2.91 MIL/uL — ABNORMAL LOW (ref 3.87–5.11)
RDW: 12.7 % (ref 11.5–15.5)
WBC: 8 10*3/uL (ref 4.0–10.5)
nRBC: 0 % (ref 0.0–0.2)

## 2020-07-19 LAB — GC/CHLAMYDIA PROBE AMP (~~LOC~~) NOT AT ARMC
Chlamydia: NEGATIVE
Comment: NEGATIVE
Comment: NORMAL
Neisseria Gonorrhea: NEGATIVE

## 2020-07-19 NOTE — Progress Notes (Signed)
Joy Hartman 29 y.o. [id number] at [redacted]w[redacted]d HD#1 admitted with vaginal bleeding S: Reports back pain and right side pain that comes and goes. Denies LOF. Reports some clots when she went to the bathroom earlier. Reports normal FM  O: Vitals:   07/18/20 2002 07/18/20 2344 07/19/20 0530 07/19/20 0815  BP: 118/62 (!) 105/57 117/67 119/76  Pulse: 87 91 92 95  Resp: 17 16 14 16   Temp: 98.2 F (36.8 C) 98.5 F (36.9 C) 98.7 F (37.1 C) 97.9 F (36.6 C)  TempSrc: Oral Oral Oral Oral  SpO2: 100% 100% 97% 98%  Weight:      Height:       SVE closed/long/high, externally dilated Speculum: brown mucous discharge. No bright red blood or active bleeding  NST FHR 145, Cat 1. Toco quiet  A/p: Joy Hartman 29 y.o. G3P1011 at [redacted]w[redacted]d HD#1 admitted with vaginal bleeding, now stable. Appropriate for discharge home 1. Vaginal bleeding: SVE remains closed, no evidence of PTL. Did not receive BMZ. [redacted]w[redacted]d on admission showed no evidence of placenta previa. Blood type O+, does not need rhogam  -Discussed likely cause of bleeding was intercourse. GC/CT pending. Ok for discharge home, reviewed precautions.  2. Fetal testing: FHR 150s, 10x10 accels, no decels. Toco quiet 3. Pregnancy c/b: Chalmydia in Sep 2021, s/p Rx, negative testing Nov. Testing on admission pending. COVID Infection Jan 2022, now recovered.  4. Vaccines: has not had Flu, COVID vaccines, recommended, declines during this admission  [name] K Taam-Akelman 07/19/20 9:27 AM

## 2020-07-19 NOTE — Progress Notes (Signed)
Patient discharged to home with mother.  Condition stable.  Pt ambulated to car with Elson Areas, NT.  No equipment for home ordered at discharge.

## 2020-07-19 NOTE — Discharge Instructions (Signed)
Vaginal Bleeding During Pregnancy, Third Trimester A small amount of bleeding, or spotting, from the vagina is common during early pregnancy. Sometimes the bleeding is normal and is not a problem, and sometimes it is a sign of something serious. In the third trimester, normal bleeding can happen:  Because of changes in your blood vessels.  When you have sex.  When you have pelvic exams. During this time, some abnormal things can cause bleeding. These include:  Infection in the womb.  Growths in the lowest part of the womb (cervix). These growths are also called polyps.  Problems of the placenta. The placenta may: ? Block the opening of the cervix. ? Break away from the womb. ? Grow into the muscle of the womb.  Early labor. Tell your doctor right away about any bleeding from your vagina. Follow these instructions at home: Watch your bleeding  Watch your condition for any changes. Let your doctor know if you are worried about something.  Try to know what causes your bleeding. Ask yourself these questions: ? Does the bleeding start on its own? ? Does the bleeding start after something is done, such as sex or a pelvic exam?  Use a diary to write the things you see about your bleeding. Write in your diary: ? If the bleeding flows freely without stopping, or if it starts and stops, and then starts again. ? If the bleeding is heavy or light. ? How many pads you use in a day and how much blood is in them.  Tell your doctor if you pass tissue. He or she may want to see it.   Activity  Follow your doctor's instructions about how active you can be. Your doctor may recommend that you: ? Stay in bed and only get up to use the bathroom. ? Continue light activity.  Ask your doctor if it is safe for you to drive.  Do not lift anything that is heavier than 10 lb (4.5 kg), or the limit that you are told.  Do not have sex until your doctor says that this is safe.  If needed, make plans  for someone to help you with your normal activities. Medicines  Take over-the-counter and prescription medicines only as told by your doctor.  Do not take aspirin. It can cause bleeding. General instructions  Do not use tampons.  Do not douche.  Keep all follow-up visits. Contact a doctor if:  You have vaginal bleeding at any time during pregnancy.  You have cramps.  You have a fever. Get help right away if:  You have very bad cramps.  You have very bad pain in your back or belly (abdomen).  You have a gush of fluid from your vagina.  Your bleeding gets worse.  You pass large clots or a lot of tissue from your vagina.  You feel light-headed or weak.  You pass out (faint).  Your baby is moving less than usual, or not moving at all. Summary  A small amount of bleeding is normal during pregnancy. Some bleeding may be caused by more serious problems.  Tell your doctor right away about any bleeding from your vagina.  Follow instructions from your doctor about how active you can be. You may need someone to help you with your normal activities. This information is not intended to replace advice given to you by your health care provider. Make sure you discuss any questions you have with your health care provider. Document Revised: 02/05/2020 Document Reviewed: 02/05/2020   Elsevier Patient Education  2021 [employer] Inc[employer]  

## 2020-07-19 NOTE — Discharge Summary (Signed)
     Antepartum Discharge Summary   Patient Name: Joy Hartman DOB: 05/09/1991 MRN: [id number]  Date of admission: 2/20/2022Date of discharge: 07/19/2020  Admitting diagnosis: Vaginal bleeding in pregnancy, third trimester [O46.93] Intrauterine pregnancy: [redacted]w[redacted]d     Secondary diagnosis:  Active Problems:   Vaginal bleeding in pregnancy, third trimester    Discharge diagnosis: pregnant at [redacted]w[redacted]d                                               Hospital course: Joy Hartman 30 y.o. [id number] was admitted at [redacted]w[redacted]d for new vaginal bleeding. Patient had intercourse that day and noticed heavy vaginal bleeding afterwards. In MAU, bleeding noted to be brisk, US showed no previa and cervical length of 3.37, no obvious abruption. Fetal status reassuring. SVE was closed (external 2-3cm). Monitored overnight, had reassuring testing and no more bright red bleeding. SVE was unchanged. Stable for discharge home with precautions.   Magnesium Sulfate received: No BMZ received: No Rhophylac:N/A   Physical exam  Vitals:   07/18/20 2002 07/18/20 2344 07/19/20 0530 07/19/20 0815  BP: 118/62 (!) 105/57 117/67 119/76  Pulse: 87 91 92 95  Resp: 17 16 14 16   Temp: 98.2 F (36.8 C) 98.5 F (36.9 C) 98.7 F (37.1 C) 97.9 F (36.6 C)  TempSrc: Oral Oral Oral Oral  SpO2: 100% 100% 97% 98%  Weight:      Height:        SVE closed/long/high, externally dilated Speculum: brown mucous discharge. No bright red blood or active bleeding Labs: Lab Results  Component Value Date   WBC 8.0 07/19/2020   HGB 9.7 (L) 07/19/2020   HCT 27.9 (L) 07/19/2020   MCV 95.9 07/19/2020   PLT 181 07/19/2020   After visit meds:  Allergies as of 07/19/2020      Reactions   Hydrocodone Itching, Swelling   Itching and swelling      Medication List    TAKE these medications   acetaminophen 500 MG tablet Commonly known as: TYLENOL Take 1,000 mg by mouth every 6 (six) hours as needed.   albuterol 108 (90 Base)  MCG/ACT inhaler Commonly known as: VENTOLIN HFA Inhale 2 puffs into the lungs every 4 (four) hours as needed for wheezing or shortness of breath.   fluticasone 50 MCG/ACT nasal spray Commonly known as: Flonase Place 1 spray into both nostrils daily.   metoCLOPramide 10 MG tablet Commonly known as: REGLAN Take 1 tablet (10 mg total) by mouth every 8 (eight) hours as needed for nausea.   ondansetron 4 MG tablet Commonly known as: Zofran Take 1 tablet (4 mg total) by mouth daily as needed for nausea or vomiting.   prenatal multivitamin Tabs tablet Take 1 tablet by mouth daily at 12 noon.        Discharge home in stable condition Follow up Visit:  Follow-up Information    07/21/2020, MD. Go on 07/28/2020.   Specialty: Obstetrics Contact information: [address] [place] [phone]              07/19/2020 07/21/2020, MD

## 2020-08-30 ENCOUNTER — Telehealth (INDEPENDENT_AMBULATORY_CARE_PROVIDER_SITE_OTHER): Payer: Medicaid Other | Admitting: Family

## 2020-08-30 DIAGNOSIS — Z7689 Persons encountering health services in other specified circumstances: Secondary | ICD-10-CM | POA: Diagnosis not present

## 2020-08-30 NOTE — Progress Notes (Signed)
Virtual Visit via Telephone Note  I connected with Joy Hartman, on 08/30/2020 at 9:04 AM by telephone due to the COVID-19 pandemic and verified that I am speaking with the correct person using two identifiers.  Due to current restrictions/limitations of in-office visits due to the COVID-19 pandemic, this scheduled clinical appointment was converted to a telehealth visit.   Consent: I discussed the limitations, risks, security and privacy concerns of performing an evaluation and management service by telephone and the availability of in person appointments. I also discussed with the patient that there may be a patient responsible charge related to this service. The patient expressed understanding and agreed to proceed.  Location of Patient: Home  Location of Provider: [facility]  Persons participating in Telemedicine visit: Joy Hartman Houseman, NP Margorie John, CMA  History of Present Illness: Joy Hartman is a 30 year-old female who presents to establish care.   Current issues and/or concerns:  Currently pregnant and due for delivery 09/27/2020. Plans to receive ongoing healthcare with Primary Care after delivery and medical release from Obstetrics/Gynecology.   Past Medical History:  Diagnosis Date  . Asthma   . Chlamydia   . Gonorrhea   . Headache   . Panic attack   . Trichomonas    Allergies  Allergen Reactions  . Codeine Hives, Itching, Rash and Swelling  . Hydrocodone Itching and Swelling    Itching and swelling    Current Outpatient Medications on File Prior to Visit  Medication Sig Dispense Refill  . acetaminophen (TYLENOL) 500 MG tablet Take 1,000 mg by mouth every 6 (six) hours as needed.    Marland Kitchen albuterol (VENTOLIN HFA) 108 (90 Base) MCG/ACT inhaler Inhale 2 puffs into the lungs every 4 (four) hours as needed for wheezing or shortness of breath. 6.7 g 2  . fluticasone (FLONASE) 50 MCG/ACT nasal spray Place 1 spray  into both nostrils daily. 1 g 0  . metoCLOPramide (REGLAN) 10 MG tablet Take 1 tablet (10 mg total) by mouth every 8 (eight) hours as needed for nausea. 30 tablet 0  . ondansetron (ZOFRAN) 4 MG tablet Take 1 tablet (4 mg total) by mouth daily as needed for nausea or vomiting. 30 tablet 1  . Prenatal Vit-Fe Fumarate-FA (PRENATAL MULTIVITAMIN) TABS tablet Take 1 tablet by mouth daily at 12 noon.     No current facility-administered medications on file prior to visit.    Observations/Objective: Alert and oriented x 3. Not in acute distress. Physical examination not completed as this is a telemedicine visit.  Assessment and Plan: 1. Encounter to establish care: - Plans to receive ongoing healthcare with Primary Care after delivery and medical release from Obstetrics/Gynecology. EDD: 09/27/2020.   Follow Up Instructions: Follow-up with primary provider as scheduled.    Patient was given clear instructions to go to Emergency Department or return to medical center if symptoms don't improve, worsen, or new problems develop.The patient verbalized understanding.  I discussed the assessment and treatment plan with the patient. The patient was provided an opportunity to ask questions and all were answered. The patient agreed with the plan and demonstrated an understanding of the instructions.   The patient was advised to call back or seek an in-person evaluation if the symptoms worsen or if the condition fails to improve as anticipated.    I provided 5 minutes total of non-face-to-face time during this encounter including median intraservice time, reviewing previous notes, labs, imaging, medications, management and  patient verbalized understanding.    Rema Fendt, NP  [facility] [place], [place] [phone] 08/30/2020, 9:04 AM

## 2020-09-07 ENCOUNTER — Ambulatory Visit (INDEPENDENT_AMBULATORY_CARE_PROVIDER_SITE_OTHER): Payer: Medicaid Other | Admitting: Nurse Practitioner

## 2020-09-07 VITALS — BP 126/78 | HR 104 | Temp 97.9°F | Resp 18

## 2020-09-07 DIAGNOSIS — Z8616 Personal history of COVID-19: Secondary | ICD-10-CM | POA: Diagnosis not present

## 2020-09-07 DIAGNOSIS — R519 Headache, unspecified: Secondary | ICD-10-CM | POA: Insufficient documentation

## 2020-09-07 DIAGNOSIS — G8929 Other chronic pain: Secondary | ICD-10-CM | POA: Insufficient documentation

## 2020-09-07 NOTE — Patient Instructions (Signed)
Chronic headaches:  May continue Fioricet as prescribed OB/GYN  May start magnesium 600 mg daily - please check with OB/gyn before starting  Will place referral to neurology   Form - Headache Record There are many types and causes of headaches. A headache record can help guide your treatment plan. Use this form to record the details. Bring this form with you to your follow-up visits. Follow your health care provider's instructions on how to describe your headache. You may be asked to:  Use a pain scale. This is a tool to rate the intensity of your headache using words or numbers.  Describe what your headache feels like, such as dull, achy, throbbing, or sharp. Headache record Date: _______________ Time (from start to end): ____________________ Location of the headache: _________________________  Intensity of the headache: ____________________ Description of the headache: ______________________________________________________________  Hours of sleep the night before the headache: __________  Food or drinks before the headache started: ______________________________________________________________________________________  Events before the headache started: _______________________________________________________________________________________________  Symptoms before the headache started: __________________________________________________________________________________________  Symptoms during the headache: __________________________________________________________________________________________________  Treatment: ________________________________________________________________________________________________________________  Effect of treatment: _________________________________________________________________________________________________________  Other comments: ___________________________________________________________________________________________________________ Date:  _______________ Time (from start to end): ____________________ Location of the headache: _________________________  Intensity of the headache: ____________________ Description of the headache: ______________________________________________________________  Hours of sleep the night before the headache: __________  Food or drinks before the headache started: ______________________________________________________________________________________  Events before the headache started: ____________________________________________________________________________________________  Symptoms before the headache started: _________________________________________________________________________________________  Symptoms during the headache: _______________________________________________________________________________________________  Treatment: ________________________________________________________________________________________________________________  Effect of treatment: _________________________________________________________________________________________________________  Other comments: ___________________________________________________________________________________________________________ Date: _______________ Time (from start to end): ____________________ Location of the headache: _________________________  Intensity of the headache: ____________________ Description of the headache: ______________________________________________________________  Hours of sleep the night before the headache: __________  Food or drinks before the headache started: ______________________________________________________________________________________  Events before the headache started: ____________________________________________________________________________________________  Symptoms before the headache started:  _________________________________________________________________________________________  Symptoms during the headache: _______________________________________________________________________________________________  Treatment: ________________________________________________________________________________________________________________  Effect of treatment: _________________________________________________________________________________________________________  Other comments: ___________________________________________________________________________________________________________ Date: _______________ Time (from start to end): ____________________ Location of the headache: _________________________  Intensity of the headache: ____________________ Description of the headache: ______________________________________________________________  Hours of sleep the night before the headache: _________  Food or drinks before the headache started: ______________________________________________________________________________________  Events before the headache started: ____________________________________________________________________________________________  Symptoms before the headache started: _________________________________________________________________________________________  Symptoms during the headache: _______________________________________________________________________________________________  Treatment: ________________________________________________________________________________________________________________  Effect of treatment: _________________________________________________________________________________________________________  Other comments: ___________________________________________________________________________________________________________ Date: _______________ Time (from start to end): ____________________ Location of the headache:  _________________________  Intensity of the headache: ____________________ Description of the headache: ______________________________________________________________  Hours of sleep the night before the headache: _________  Food or drinks before the headache started: ______________________________________________________________________________________  Events before the headache started: ____________________________________________________________________________________________  Symptoms before the headache started: _________________________________________________________________________________________  Symptoms during the headache: _______________________________________________________________________________________________  Treatment: ________________________________________________________________________________________________________________  Effect of treatment: _________________________________________________________________________________________________________  Other comments: ___________________________________________________________________________________________________________ This information is not intended to replace advice given to you by your health care provider. Make sure you discuss any questions you have with your health care provider. Document Revised: 06/03/2018 Document Reviewed: 06/03/2018 Elsevier Patient Education  2021 [employer]  General Headache Without Cause A headache is pain or discomfort that is felt around the head or neck area. There are many causes and types of headaches. In some cases, the cause may not be found. Follow these instructions at home: Watch your condition for any changes. Let your doctor know about them. Take these steps to help  with your condition: Managing pain  Take over-the-counter and prescription medicines only as told by your doctor.  Lie down in a dark, quiet room when you have a headache.  If told, put ice  on your head and neck area: ? Put ice in a plastic bag. ? Place a towel between your skin and the bag. ? Leave the ice on for 20 minutes, 2-3 times per day.  If told, put heat on the affected area. Use the heat source that your doctor recommends, such as a moist heat pack or a heating pad. ? Place a towel between your skin and the heat source. ? Leave the heat on for 20-30 minutes. ? Remove the heat if your skin turns bright red. This is very important if you are unable to feel pain, heat, or cold. You may have a greater risk of getting burned.  Keep lights dim if bright lights bother you or make your headaches worse.      Eating and drinking  Eat meals on a regular schedule.  If you drink alcohol: ? Limit how much you use to:  0-1 drink a day for women.  0-2 drinks a day for men. ? Be aware of how much alcohol is in your drink. In the [place] one drink equals one 12 oz bottle of beer (355 mL), one 5 oz glass of wine (148 mL), or one 1 oz glass of hard liquor (44 mL).  Stop drinking caffeine, or reduce how much caffeine you drink. General instructions  Keep a journal to find out if certain things bring on headaches. For example, write down: ? What you eat and drink. ? How much sleep you get. ? Any change to your diet or medicines.  Get a massage or try other ways to relax.  Limit stress.  Sit up straight. Do not tighten (tense) your muscles.  Do not use any products that contain nicotine or tobacco. This includes cigarettes, e-cigarettes, and chewing tobacco. If you need help quitting, ask your doctor.  Exercise regularly as told by your doctor.  Get enough sleep. This often means 7-9 hours of sleep each night.  Keep all follow-up visits as told by your doctor. This is important.   Contact a doctor if:  Your symptoms are not helped by medicine.  You have a headache that feels different than the other headaches.  You feel sick to your stomach (nauseous) or you throw  up (vomit).  You have a fever. Get help right away if:  Your headache gets very bad quickly.  Your headache gets worse after a lot of physical activity.  You keep throwing up.  You have a stiff neck.  You have trouble seeing.  You have trouble speaking.  You have pain in the eye or ear.  Your muscles are weak or you lose muscle control.  You lose your balance or have trouble walking.  You feel like you will pass out (faint) or you pass out.  You are mixed up (confused).  You have a seizure. Summary  A headache is pain or discomfort that is felt around the head or neck area.  There are many causes and types of headaches. In some cases, the cause may not be found.  Keep a journal to help find out what causes your headaches. Watch your condition for any changes. Let your doctor know about them.  Contact a doctor if you have a headache that is different from usual, or if your headache is  not helped by medicine.  Get help right away if your headache gets very bad, you throw up, you have trouble seeing, you lose your balance, or you have a seizure. This information is not intended to replace advice given to you by your health care provider. Make sure you discuss any questions you have with your health care provider. Document Revised: 12/03/2017 Document Reviewed: 12/03/2017 Elsevier Patient Education  2021 Elsevier Inc.   Follow up:  Follow up if needed

## 2020-09-07 NOTE — Progress Notes (Signed)
@Patient  ID: , female    DOB: [birth year], 30 y.o.   MRN: [id number]  Chief Complaint  Patient presents with  . Headache    History of covid in January 2022, states that she had headaches before having covid    Referring provider: No ref. provider found  HPI  Patient presents today for post COVID care clinic visit.  Patient was admitted to the hospital on 06/02/2020 for Covid.  Patient is pregnant and is due in May.  She is followed by OB/GYN.  She does have an appointment with him today.  She states that she has been having ongoing headaches since Covid but she did have chronic headaches before ever having COVID.  Patient has been started on Fioricet by OB/GYN.  We discussed that we can refer her to neurology and request that she keep a headache log for them. Denies f/c/s, n/v/d, hemoptysis, PND, chest pain or edema.     Allergies  Allergen Reactions  . Codeine Hives, Itching, Rash and Swelling  . Hydrocodone Itching and Swelling    Itching and swelling    Immunization History  Administered Date(s) Administered  . Pneumococcal Polysaccharide-23 03/14/2012  . Tdap 03/14/2012    Past Medical History:  Diagnosis Date  . Asthma   . Chlamydia   . Gonorrhea   . Headache   . Panic attack   . Trichomonas     Tobacco History: Social History   Tobacco Use  Smoking Status Never Smoker  Smokeless Tobacco Never Used   Counseling given: Yes   Outpatient Encounter Medications as of 09/07/2020  Medication Sig  . acetaminophen (TYLENOL) 500 MG tablet Take 1,000 mg by mouth every 6 (six) hours as needed.  11/07/2020 albuterol (VENTOLIN HFA) 108 (90 Base) MCG/ACT inhaler Inhale 2 puffs into the lungs every 4 (four) hours as needed for wheezing or shortness of breath.  . fluticasone (FLONASE) 50 MCG/ACT nasal spray Place 1 spray into both nostrils daily.  . metoCLOPramide (REGLAN) 10 MG tablet Take 1 tablet (10 mg total) by mouth every 8 (eight) hours as needed for nausea.  .  ondansetron (ZOFRAN) 4 MG tablet Take 1 tablet (4 mg total) by mouth daily as needed for nausea or vomiting.  . Prenatal Vit-Fe Fumarate-FA (PRENATAL MULTIVITAMIN) TABS tablet Take 1 tablet by mouth daily at 12 noon.   No facility-administered encounter medications on file as of 09/07/2020.     Review of Systems  Review of Systems  Constitutional: Negative.  Negative for fever.  HENT: Negative.   Respiratory: Negative for cough and shortness of breath.   Cardiovascular: Negative.  Negative for chest pain, palpitations and leg swelling.  Gastrointestinal: Negative.   Allergic/Immunologic: Negative.   Neurological: Positive for headaches.  Psychiatric/Behavioral: Negative.        Physical Exam  BP 126/78   Pulse (!) 104   Temp 97.9 F (36.6 C)   Resp 18   LMP 12/22/2019   SpO2 100% Comment: RA  Wt Readings from Last 5 Encounters:  07/18/20 186 lb (84.4 kg)  07/13/20 186 lb (84.4 kg)  09/02/18 178 lb 4.8 oz (80.9 kg)  06/15/18 196 lb (88.9 kg)  06/03/17 194 lb (88 kg)     Physical Exam Vitals and nursing note reviewed.  Constitutional:      General: She is not in acute distress.    Appearance: She is well-developed.  Cardiovascular:     Rate and Rhythm: Normal rate and regular rhythm.  Pulmonary:  Effort: Pulmonary effort is normal.     Breath sounds: Normal breath sounds.  Neurological:     Mental Status: She is alert and oriented to person, place, and time.        Assessment & Plan:   History of COVID-19 Chronic headaches:  May continue Fioricet as prescribed OB/GYN  May start magnesium 600 mg daily - please check with OB/gyn before starting  Will place referral to neurology    Follow up:  Follow up in 3 months     Ivonne Andrew, NP 09/07/2020

## 2020-09-07 NOTE — Assessment & Plan Note (Addendum)
Chronic headaches:  May continue Fioricet as prescribed OB/GYN  May start magnesium 600 mg daily - please check with OB/gyn before starting  Will place referral to neurology    Follow up:  Follow up in 3 months

## 2020-09-09 ENCOUNTER — Ambulatory Visit: Payer: Medicaid Other | Admitting: Neurology

## 2020-09-09 ENCOUNTER — Encounter: Payer: Self-pay | Admitting: Neurology

## 2020-09-09 ENCOUNTER — Other Ambulatory Visit: Payer: Self-pay

## 2020-09-09 VITALS — BP 134/89 | HR 121 | Ht 66.0 in | Wt 193.0 lb

## 2020-09-09 DIAGNOSIS — G43019 Migraine without aura, intractable, without status migrainosus: Secondary | ICD-10-CM | POA: Diagnosis not present

## 2020-09-09 NOTE — Patient Instructions (Signed)
You likely have migraine headaches.  I think there several options for preventative medications and acute medications we can think about but none of these are considered fully safe during pregnancy.  As discussed, we will pick up our discussion about medication options for you after you have your baby.  It will also depend if you are breast-feeding or not.  Please consult with your OB/GYN in the meantime as to what you can take safely for headaches.  Your neurological exam is normal thankfully.  This is very reassuring.  Nevertheless, since you have a family history of aneurysm and you have had headaches for many years, and you have never had a brain MRI, we will do an MRI in the near future.  Should you have any sudden onset of neurological symptoms such as one-sided weakness or numbness or tingling or droopy face or slurring of speech or sudden, severe headache, you need to go to the emergency room or call 911 immediately, or have someone take you or have someone call for you. Good luck with the baby!  It was nice to meet you both today and I will plan to see you in about 3 months.

## 2020-09-09 NOTE — Progress Notes (Signed)
Subjective:    Patient ID: SAQUOIA SIANEZ is a 30 y.o. female.  HPI     Huston Foley, MD, PhD [facility] [address] [address] [place], [place] [address]  Dear Archie Patten,   I saw your patient, Jennah Satchell, upon your kind request in my neurologic clinic today for initial consultation of her chronic headaches.  The patient is accompanied by her mother today.  As you know, Ms. Hellickson is a 30 year old right-handed woman with an underlying medical history of asthma, history of Covid, and recent admission for vaginal bleeding during third trimester pregnancy, who reports a longstanding history of recurrent headaches, dating back to her childhood.  She has throbbing headaches, sometimes they are one-sided, sometimes are global.  Her headache has been worse since this pregnancy.  This is her second pregnancy, with the previous pregnancy she did have headaches but they seem to respond to Tylenol.  She reports nearly daily headache in the past few months.  It is not strictly one-sided, no neurological accompaniments such as one-sided weakness or numbness or tingling or droopy face or slurring of speech, no thunderclap headache, sometimes she has blurry vision and sometimes she sees spots.  She has prescription eyeglasses.  She does have a family history of headaches affecting her maternal aunt and maternal uncle.  Mom reports that maternal aunt had a brain aneurysm and maternal uncle also apparently had surgery for a brain aneurysm. She has tried over-the-counter Tylenol but it has not been as effective.  She does not currently take any Aleve or Advil.  In the past, she has tried Excedrin Migraine which was helpful.  She was tried on Fioricet per OB/GYN but is no longer on it.  For nausea, she has tried Zofran but is currently not on it.  She does have associated light sensitivity and nausea, no vomiting.  She tries to hydrate well, she is not sleeping as well, owing to  third trimester pregnancy.  I reviewed your office note from 09/07/2020.  She is due in May.  She was advised to start magnesium, she was encouraged to talk to her OB/GYN before starting it.  She has an 19-year-old son, she quit smoking, she currently does not drink any alcohol or daily caffeine.  Her Past Medical History Is Significant For: Past Medical History:  Diagnosis Date  . Asthma   . Chlamydia   . Gonorrhea   . Headache   . Panic attack   . Trichomonas     Her Past Surgical History Is Significant For: No past surgical history on file.  Her Family History Is Significant For: Family History  Problem Relation Age of Onset  . Asthma Mother   . Hypertension Mother   . Hypertension Father   . Hypertension Maternal Grandmother   . Hypertension Maternal Grandfather   . Hypertension Paternal Grandmother   . Hypertension Paternal Grandfather     Her Social History Is Significant For: Social History   Socioeconomic History  . Marital status: Single    Spouse name: Not on file  . Number of children: Not on file  . Years of education: Not on file  . Highest education level: Not on file  Occupational History  . Not on file  Tobacco Use  . Smoking status: Never Smoker  . Smokeless tobacco: Never Used  Vaping Use  . Vaping Use: Never used  Substance and Sexual Activity  . Alcohol use: No  . Drug use: Not Currently  Types: Marijuana  . Sexual activity: Yes  Other Topics Concern  . Not on file  Social History Narrative  . Not on file   Social Determinants of Health   Financial Resource Strain: Not on file  Food Insecurity: Not on file  Transportation Needs: Not on file  Physical Activity: Not on file  Stress: Not on file  Social Connections: Not on file    Her Allergies Are:  Allergies  Allergen Reactions  . Codeine Hives, Itching, Rash and Swelling  . Hydrocodone Itching and Swelling    Itching and swelling  :   Her Current Medications Are:  Outpatient  Encounter Medications as of 09/09/2020  Medication Sig  . acetaminophen (TYLENOL) 500 MG tablet Take 1,000 mg by mouth every 6 (six) hours as needed.  Marland Kitchen albuterol (VENTOLIN HFA) 108 (90 Base) MCG/ACT inhaler Inhale 2 puffs into the lungs every 4 (four) hours as needed for wheezing or shortness of breath.  . Butalbital-APAP-Caffeine 50-300-40 MG CAPS Take 2 capsules by mouth every 8 (eight) hours as needed.  . fluticasone (FLONASE) 50 MCG/ACT nasal spray Place 1 spray into both nostrils daily.  . metoCLOPramide (REGLAN) 10 MG tablet Take 1 tablet (10 mg total) by mouth every 8 (eight) hours as needed for nausea.  . ondansetron (ZOFRAN) 4 MG tablet Take 1 tablet (4 mg total) by mouth daily as needed for nausea or vomiting.  . Prenatal Vit-Fe Fumarate-FA (PRENATAL MULTIVITAMIN) TABS tablet Take 1 tablet by mouth daily at 12 noon.   No facility-administered encounter medications on file as of 09/09/2020.  :   Review of Systems:  Out of a complete 14 point review of systems, all are reviewed and negative with the exception of these symptoms as listed below:  Review of Systems  Neurological:       Pt presents today 9 months pregnant and suffering with headaches/migraines. She has a hx of headaches and migraines. She has never been on preventative treatment. Usually will wake up with HA daily and it will linger all day. Daily headaches. She is unable to count how many migraines she has had in last month but states it has been >8-10. In past she has only taken over the counter medications excedrin migraine, advil, aleve and more recently tylenol and was prescribed fiorcet. None of these have been helpful with relieving the headaches.     Objective:  Neurological Exam  Physical Exam Physical Examination:   Vitals:   09/09/20 1333  BP: 134/89  Pulse: (!) 121    General Examination: The patient is a very pleasant 30 y.o. female in no acute distress. She appears well-developed and well-nourished  and well groomed.   HEENT: Normocephalic, atraumatic, pupils are equal, round and reactive to light and accommodation. Funduscopic exam is normal with sharp disc margins noted.  Able to tolerate the eye exam without significant photophobia.  Extraocular tracking is good without limitation to gaze excursion or nystagmus noted. Normal smooth pursuit is noted. Hearing is grossly intact. Face is symmetric with normal facial animation and normal facial sensation. Speech is clear with no dysarthria noted. There is no hypophonia. There is no lip, neck/head, jaw or voice tremor. Neck is supple with full range of passive and active motion. There are no carotid bruits on auscultation. Oropharynx exam reveals: mild mouth dryness. Tongue protrudes centrally and palate elevates symmetrically.   Chest: Clear to auscultation without wheezing, rhonchi or crackles noted.  Heart: S1+S2+0, regular and normal without murmurs, rubs or gallops  noted.   Abdomen: Soft, non-tender and non-distended, pregnant.   Extremities: There is no pitting edema in the distal lower extremities bilaterally. Pedal pulses are intact.  Skin: Warm and dry without trophic changes noted.  Musculoskeletal: exam reveals no obvious joint deformities, tenderness or joint swelling or erythema.   Neurologically:  Mental status: The patient is awake, alert and oriented in all 4 spheres. Her immediate and remote memory, attention, language skills and fund of knowledge are appropriate. There is no evidence of aphasia, agnosia, apraxia or anomia. Speech is clear with normal prosody and enunciation. Thought process is linear. Mood is normal and affect is normal.  Cranial nerves II - XII are as described above under HEENT exam. In addition: shoulder shrug is normal with equal shoulder height noted. Motor exam: Normal bulk, strength and tone is noted. There is no drift, tremor or rebound. Reflexes are 2+ throughout. Babinski: Toes are flexor bilaterally.  Fine motor skills and coordination: intact with normal finger taps, normal hand movements, normal rapid alternating patting, normal foot taps and normal foot agility.  Cerebellar testing: No dysmetria or intention tremor on finger to nose testing. Heel to shin is unremarkable bilaterally. There is no truncal or gait ataxia.  Sensory exam: intact to light touch, vibration, temperature sense in the upper and lower extremities.  Gait, station and balance: She stands easily. No veering to one side is noted. No leaning to one side is noted. Posture is age-appropriate and stance is narrow based. Gait shows normal stride length and normal pace. No problems turning are noted.                Assessment and Plan:   In summary, Jonella D Albano is a very pleasant 31 y.o.-year old female with an underlying medical history of asthma, history of Covid, and recent admission for vaginal bleeding during third trimester pregnancy, who presents for evaluation of her recurrent headaches, her history is supportive of migraine headaches, typically without aura.  She is currently third trimester pregnant, she is due early next month.  She is advised that there are several good preventative and acute medication options for migraine management but we do not typically consider them fully safe during pregnancy and some of the medications also are not fully safe during lactation.  She is advised to continue to use Tylenol as needed and follow-up with her OB/GYN for any additional advised for medications that are considered acceptable during pregnancy.  She is reassured that her neurological exam is normal.  She has not had any alarming symptoms.  She reports a family history of brain aneurysm.  She has never had a brain scan, we will consider a brain MRI in the near future.  She is advised to seek immediate medical attention if she has any severe and sudden headache, one-sided neurological symptoms such as one-sided weakness or numbness  or tingling or droopy face or slurring of speech.  For now, she is advised to follow-up routinely in this office in 3 months.  We will pick up our discussion about medication options for preventative medicines for migraines and/or acute management.  She is advised to stay well-hydrated and well rested as best she can.  I answered all the questions today and the patient and her mother were in agreement.   Thank you very much for allowing me to participate in the care of this nice patient. If I can be of any further assistance to you please do not hesitate to call me  at [phone].  Sincerely,   Star Age, MD, PhD

## 2020-09-25 ENCOUNTER — Other Ambulatory Visit: Payer: Self-pay

## 2020-09-25 ENCOUNTER — Inpatient Hospital Stay (HOSPITAL_COMMUNITY)
Admission: AD | Admit: 2020-09-25 | Discharge: 2020-09-27 | DRG: 806 | Disposition: A | Discharge status: Home / Self Care | Payer: Medicaid Other | Attending: Obstetrics and Gynecology | Admitting: Obstetrics and Gynecology

## 2020-09-25 ENCOUNTER — Encounter (HOSPITAL_COMMUNITY): Payer: Self-pay | Admitting: Obstetrics and Gynecology

## 2020-09-25 DIAGNOSIS — D62 Acute posthemorrhagic anemia: Secondary | ICD-10-CM | POA: Diagnosis not present

## 2020-09-25 DIAGNOSIS — O26893 Other specified pregnancy related conditions, third trimester: Secondary | ICD-10-CM | POA: Diagnosis not present

## 2020-09-25 DIAGNOSIS — O9952 Diseases of the respiratory system complicating childbirth: Secondary | ICD-10-CM | POA: Diagnosis present

## 2020-09-25 DIAGNOSIS — Z3A39 39 weeks gestation of pregnancy: Secondary | ICD-10-CM | POA: Diagnosis not present

## 2020-09-25 DIAGNOSIS — Z20822 Contact with and (suspected) exposure to covid-19: Secondary | ICD-10-CM | POA: Diagnosis present

## 2020-09-25 DIAGNOSIS — J45909 Unspecified asthma, uncomplicated: Secondary | ICD-10-CM | POA: Diagnosis present

## 2020-09-25 DIAGNOSIS — O99824 Streptococcus B carrier state complicating childbirth: Secondary | ICD-10-CM | POA: Diagnosis not present

## 2020-09-25 DIAGNOSIS — O9081 Anemia of the puerperium: Secondary | ICD-10-CM | POA: Diagnosis not present

## 2020-09-25 LAB — CBC
HCT: 33.3 % — ABNORMAL LOW (ref 36.0–46.0)
HCT: 25.1 % — ABNORMAL LOW (ref 36.0–46.0)
Hemoglobin: 11.6 g/dL — ABNORMAL LOW (ref 12.0–15.0)
Hemoglobin: 8.9 g/dL — ABNORMAL LOW (ref 12.0–15.0)
MCH: 33 pg (ref 26.0–34.0)
MCH: 33.8 pg (ref 26.0–34.0)
MCHC: 34.8 g/dL (ref 30.0–36.0)
MCHC: 35.5 g/dL (ref 30.0–36.0)
MCV: 94.9 fL (ref 80.0–100.0)
MCV: 95.4 fL (ref 80.0–100.0)
Platelets: 211 10*3/uL (ref 150–400)
Platelets: 189 10*3/uL (ref 150–400)
RBC: 3.51 MIL/uL — ABNORMAL LOW (ref 3.87–5.11)
RBC: 2.63 MIL/uL — ABNORMAL LOW (ref 3.87–5.11)
RDW: 12.6 % (ref 11.5–15.5)
RDW: 12.6 % (ref 11.5–15.5)
WBC: 9.6 10*3/uL (ref 4.0–10.5)
WBC: 14 10*3/uL — ABNORMAL HIGH (ref 4.0–10.5)
nRBC: 0 % (ref 0.0–0.2)
nRBC: 0 % (ref 0.0–0.2)

## 2020-09-25 LAB — RESP PANEL BY RT-PCR (FLU A&B, COVID) ARPGX2
Influenza A by PCR: NEGATIVE
Influenza B by PCR: NEGATIVE
SARS Coronavirus 2 by RT PCR: NEGATIVE

## 2020-09-25 LAB — RPR: RPR Ser Ql: NONREACTIVE

## 2020-09-25 MED ORDER — OXYTOCIN-SODIUM CHLORIDE 30-0.9 UT/500ML-% IV SOLN
2.5000 [IU]/h | INTRAVENOUS | Status: DC
  Filled 2020-09-25: qty 500

## 2020-09-25 MED ORDER — TRANEXAMIC ACID-NACL 1000-0.7 MG/100ML-% IV SOLN
INTRAVENOUS | Status: AC
  Filled 2020-09-25: qty 100

## 2020-09-25 MED ORDER — LACTATED RINGERS IV SOLN: INTRAVENOUS | Status: DC

## 2020-09-25 MED ORDER — SOD CITRATE-CITRIC ACID 500-334 MG/5ML PO SOLN: 30.0000 mL | ORAL | Status: DC | PRN

## 2020-09-25 MED ORDER — LIDOCAINE HCL (PF) 1 % IJ SOLN
30.0000 mL | INTRAMUSCULAR | Status: AC | PRN
  Administered 2020-09-25: 30 mL via SUBCUTANEOUS
  Filled 2020-09-25: qty 30

## 2020-09-25 MED ORDER — SENNOSIDES-DOCUSATE SODIUM 8.6-50 MG PO TABS
2.0000 | ORAL_TABLET | Freq: Every day | ORAL | Status: DC
  Administered 2020-09-26 – 2020-09-27 (×2): 2 via ORAL
  Filled 2020-09-25 (×2): qty 2

## 2020-09-25 MED ORDER — OXYTOCIN BOLUS FROM INFUSION
333.0000 mL | Freq: Once | INTRAVENOUS | Status: AC
  Administered 2020-09-25: 333 mL via INTRAVENOUS

## 2020-09-25 MED ORDER — ACETAMINOPHEN 325 MG PO TABS
650.0000 mg | ORAL_TABLET | ORAL | Status: DC | PRN
  Administered 2020-09-25 – 2020-09-27 (×3): 650 mg via ORAL
  Filled 2020-09-25 (×2): qty 2

## 2020-09-25 MED ORDER — DIPHENHYDRAMINE HCL 25 MG PO CAPS: 25.0000 mg | ORAL_CAPSULE | Freq: Four times a day (QID) | ORAL | Status: DC | PRN

## 2020-09-25 MED ORDER — FLEET ENEMA 7-19 GM/118ML RE ENEM: 1.0000 | ENEMA | RECTAL | Status: DC | PRN

## 2020-09-25 MED ORDER — LACTATED RINGERS IV SOLN: 500.0000 mL | INTRAVENOUS | Status: DC | PRN

## 2020-09-25 MED ORDER — ONDANSETRON HCL 4 MG/2ML IJ SOLN: 4.0000 mg | Freq: Four times a day (QID) | INTRAMUSCULAR | Status: DC | PRN

## 2020-09-25 MED ORDER — MISOPROSTOL 200 MCG PO TABS: 800.0000 ug | ORAL_TABLET | Freq: Once | ORAL | Status: AC

## 2020-09-25 MED ORDER — METHYLERGONOVINE MALEATE 0.2 MG/ML IJ SOLN: 0.2000 mg | Freq: Once | INTRAMUSCULAR | Status: AC

## 2020-09-25 MED ORDER — FENTANYL CITRATE (PF) 100 MCG/2ML IJ SOLN
INTRAMUSCULAR | Status: AC
  Administered 2020-09-25: 50 ug via INTRAVENOUS
  Filled 2020-09-25: qty 4

## 2020-09-25 MED ORDER — PRENATAL MULTIVITAMIN CH
1.0000 | ORAL_TABLET | Freq: Every day | ORAL | Status: DC
  Administered 2020-09-25 – 2020-09-27 (×3): 1 via ORAL
  Filled 2020-09-25 (×3): qty 1

## 2020-09-25 MED ORDER — ONDANSETRON HCL 4 MG/2ML IJ SOLN: 4.0000 mg | INTRAMUSCULAR | Status: DC | PRN

## 2020-09-25 MED ORDER — METHYLERGONOVINE MALEATE 0.2 MG/ML IJ SOLN
INTRAMUSCULAR | Status: AC
  Administered 2020-09-25: 0.2 mg via INTRAMUSCULAR
  Filled 2020-09-25: qty 3

## 2020-09-25 MED ORDER — SODIUM CHLORIDE 0.9 % IV SOLN: 1.0000 g | INTRAVENOUS | Status: DC

## 2020-09-25 MED ORDER — SIMETHICONE 80 MG PO CHEW: 80.0000 mg | CHEWABLE_TABLET | ORAL | Status: DC | PRN

## 2020-09-25 MED ORDER — LACTATED RINGERS IV BOLUS
1000.0000 mL | Freq: Once | INTRAVENOUS | Status: AC
  Administered 2020-09-25: 1000 mL via INTRAVENOUS

## 2020-09-25 MED ORDER — FERROUS SULFATE 325 (65 FE) MG PO TABS
325.0000 mg | ORAL_TABLET | Freq: Two times a day (BID) | ORAL | Status: DC
  Administered 2020-09-25 – 2020-09-26 (×2): 325 mg via ORAL
  Filled 2020-09-25: qty 1

## 2020-09-25 MED ORDER — OXYTOCIN-SODIUM CHLORIDE 30-0.9 UT/500ML-% IV SOLN
INTRAVENOUS | Status: AC
  Filled 2020-09-25: qty 500

## 2020-09-25 MED ORDER — COCONUT OIL OIL: 1.0000 "application " | TOPICAL_OIL | Status: DC | PRN

## 2020-09-25 MED ORDER — TRANEXAMIC ACID-NACL 1000-0.7 MG/100ML-% IV SOLN
1000.0000 mg | Freq: Once | INTRAVENOUS | Status: AC
  Administered 2020-09-25: 1000 mg via INTRAVENOUS

## 2020-09-25 MED ORDER — FENTANYL CITRATE (PF) 100 MCG/2ML IJ SOLN: 50.0000 ug | Freq: Once | INTRAMUSCULAR | Status: AC

## 2020-09-25 MED ORDER — ZOLPIDEM TARTRATE 5 MG PO TABS: 5.0000 mg | ORAL_TABLET | Freq: Every evening | ORAL | Status: DC | PRN

## 2020-09-25 MED ORDER — SODIUM CHLORIDE 0.9 % IV SOLN: 2.0000 g | Freq: Once | INTRAVENOUS | Status: DC

## 2020-09-25 MED ORDER — DIBUCAINE (PERIANAL) 1 % EX OINT: 1.0000 "application " | TOPICAL_OINTMENT | CUTANEOUS | Status: DC | PRN

## 2020-09-25 MED ORDER — IBUPROFEN 600 MG PO TABS
600.0000 mg | ORAL_TABLET | Freq: Four times a day (QID) | ORAL | Status: DC
  Administered 2020-09-25 – 2020-09-27 (×9): 600 mg via ORAL
  Filled 2020-09-25 (×9): qty 1

## 2020-09-25 MED ORDER — ACETAMINOPHEN 325 MG PO TABS: 650.0000 mg | ORAL_TABLET | ORAL | Status: DC | PRN

## 2020-09-25 MED ORDER — CARBOPROST TROMETHAMINE 250 MCG/ML IM SOLN
INTRAMUSCULAR | Status: AC
  Filled 2020-09-25: qty 1

## 2020-09-25 MED ORDER — WITCH HAZEL-GLYCERIN EX PADS: 1.0000 "application " | MEDICATED_PAD | CUTANEOUS | Status: DC | PRN

## 2020-09-25 MED ORDER — BENZOCAINE-MENTHOL 20-0.5 % EX AERO: 1.0000 "application " | INHALATION_SPRAY | CUTANEOUS | Status: DC | PRN

## 2020-09-25 MED ORDER — TETANUS-DIPHTH-ACELL PERTUSSIS 5-2.5-18.5 LF-MCG/0.5 IM SUSY: 0.5000 mL | PREFILLED_SYRINGE | Freq: Once | INTRAMUSCULAR | Status: DC

## 2020-09-25 MED ORDER — SODIUM CHLORIDE 0.9 % IV SOLN
2.0000 g | Freq: Once | INTRAVENOUS | Status: AC
  Administered 2020-09-25: 2 g via INTRAVENOUS
  Filled 2020-09-25: qty 2000

## 2020-09-25 MED ORDER — MISOPROSTOL 200 MCG PO TABS
ORAL_TABLET | ORAL | Status: AC
  Administered 2020-09-25: 800 ug via RECTAL
  Filled 2020-09-25: qty 5

## 2020-09-25 MED ORDER — ONDANSETRON HCL 4 MG PO TABS: 4.0000 mg | ORAL_TABLET | ORAL | Status: DC | PRN

## 2020-09-25 NOTE — Progress Notes (Signed)
Patient arrived to MAU c/o ctx. Patient stated that she was 6 cm in the office yesterday. efm commenced. VE in progress

## 2020-09-25 NOTE — Progress Notes (Signed)
Called by RN on mother baby that pt had just arrived from L&D and her bleeding was more than she thought was appropriate.  I arrived a few minutes later to assess, upon by arrival, pt's blood loss was approx 500 in addition to approx 500 on L&D prior to transfer.  I advised RN to start TXA, administer one dose of methergine and I placed of rectal cytotec.  Foley catheter placed and IV fentanyl given for pain control followed by manual sweep of the uterus with evacuation of add'l abundant blood and clot.  Fundus began to contract down better and after all medications were administered an add'l sweep of uterus with sterile gloves performed with about 100 cc clot.  Final sweep of uterus performed about 15 min later with well contracted uterus and add'l approx 100cc clot removed.  Total EBL on mother-baby floor approx 1200cc for total of approx 1700-1800cc.  Pt was given 1L IV bolus and remained conscious throughout the PPH episode.  CBC ordered for 2pm.  Discussed with RN she will continue to check bleeding and do fundal massage q10 min for next hour and do q15 min vital signs.  Will alert me to any concerns.

## 2020-09-25 NOTE — H&P (Signed)
30 y.o. [redacted]w[redacted]d  G3P1011 comes in c/o contractions, seen in office yesterday with irregular contractions and 5cm.  Otherwise has good fetal movement and no bleeding.  Past Medical History:  Diagnosis Date  . Asthma   . Chlamydia   . Gonorrhea   . Headache   . Panic attack   . Trichomonas    No past surgical history on file.  OB History  Gravida Para Term Preterm AB Living  3 1 1  0 1 1  SAB IAB Ectopic Multiple Live Births  1 0 0 0 1    # Outcome Date GA Lbr Len/2nd Weight Sex Delivery Anes PTL Lv  3 Current           2 SAB 09/19/18 [redacted]w[redacted]d    SAB     1 Term 03/13/12 [redacted]w[redacted]d 13:29 / 00:43 3487 g M Vag-Spont EPI  LIV     Birth Comments: WNL    Social History   Socioeconomic History  . Marital status: Single    Spouse name: Not on file  . Number of children: Not on file  . Years of education: Not on file  . Highest education level: Not on file  Occupational History  . Not on file  Tobacco Use  . Smoking status: Never Smoker  . Smokeless tobacco: Never Used  Vaping Use  . Vaping Use: Never used  Substance and Sexual Activity  . Alcohol use: No  . Drug use: Not Currently    Types: Marijuana  . Sexual activity: Yes  Other Topics Concern  . Not on file  Social History Narrative  . Not on file   Social Determinants of Health   Financial Resource Strain: Not on file  Food Insecurity: Not on file  Transportation Needs: Not on file  Physical Activity: Not on file  Stress: Not on file  Social Connections: Not on file  Intimate Partner Violence: Not on file   Codeine and Hydrocodone    Prenatal Transfer Tool  Maternal Diabetes: No Genetic Screening: Normal Maternal Ultrasounds/Referrals: Normal Fetal Ultrasounds or other Referrals:  None Maternal Substance Abuse:  No Significant Maternal Medications:  None Significant Maternal Lab Results: Group B Strep positive  Other PNC: uncomplicated.    Vitals:   09/25/20 0731  BP: 137/76  Pulse: 100  Resp: 20  Temp: 98  F (36.7 C)  TempSrc: Oral  SpO2: 100%     SVE:  Per MAu 7-8cm FHTs:  150 mod var, no decels, only a few mins of tracing so far Toco:  Pending further tracing   A/P   Admit with labor and advanced cervical dilation  GBS Pos- ampicillin given multiparous with advance dilation  Pt desires epidural  Other routine orders  09/27/20

## 2020-09-25 NOTE — Lactation Note (Signed)
This note was copied from a baby's chart. Lactation Consultation Note  Patient Name: Joy Hartman [id number] Date: 09/25/2020 Reason for consult: L&D Initial assessment Age:29 hours   P2, Baby cueing upon entering.  Helped guide baby to breast and he latched easily. Lactation to follow up on MBU.    Maternal Data Does the patient have breastfeeding experience prior to this delivery?: Yes How long did the patient breastfeed?: 3 mos  Feeding  Breast  LATCH Score Latch: Grasps breast easily, tongue down, lips flanged, rhythmical sucking.  Audible Swallowing: A few with stimulation  Type of Nipple: Everted at rest and after stimulation  Comfort (Breast/Nipple): Soft / non-tender  Hold (Positioning): Assistance needed to correctly position infant at breast and maintain latch.  LATCH Score: 8   Interventions Interventions: Education;Assisted with latch;Skin to skin     Consult Status Consult Status: Follow-up Date: 09/25/20 Follow-up type: In-patient    [name] [facility] 09/25/2020, 8:31 AM

## 2020-09-25 NOTE — MAU Note (Signed)
Pt reports to mau with c/o ctx since 0500 this morning.  Unable to tell how far apart they have been.  Rating pain 10/10.  Denies LOF. +FM

## 2020-09-25 NOTE — Lactation Note (Signed)
This note was copied from a baby's chart. Lactation Consultation Note  Patient Name: Boy Sabrinia Prien [id number] Date: 09/25/2020 Reason for consult: Initial assessment;Term Age:29 hours Infant had 3 stools and 2 voids since birth, LC change a void and stool diaper while in room. Infant had low blood sugars and been jittery RN is aware. Per mom ,she recently finished breastfeeding infant for 15 minutes,  less than 30 minutes prior to [facility] entering the room, infant little jittery. LC assisted mom with latching infant back at the breast, mom latched infant on her left breast using the football hold, infant latched with depth and BF another 7 minutes, afterwards mom hand expressed 6 mls of colostrum that was spoon feed. Mom was doing,STS with infant when LC left the room. LC alert RN of infant's  jitteriness she observed in room, infant latched  and was  supplemented with mom's EBM,  and that mom currently is  doing STS with infant.  LC discussed infant's input and output with mom. Mom's plan:  1-Mom will continue to breastfeed infant according to hunger cues, 8 to 12 or more times within , 24 hours, STS. 2- After latching infant at the breast mom will hand express and give infant extra volume EBM by spoon and do lots of STS with infant. 3-Mom knows to call RN or LC if she has any questions, concerns or need assistance with latching infant at breast.  Maternal Data Has patient been taught Hand Expression?: Yes Does the patient have breastfeeding experience prior to this delivery?: Yes How long did the patient breastfeed?: Per mom, she BF her 49 year old son for 3 months  Feeding Mother's Current Feeding Choice: Breast Milk  LATCH Score Latch: Grasps breast easily, tongue down, lips flanged, rhythmical sucking.  Audible Swallowing: Spontaneous and intermittent  Type of Nipple: Everted at rest and after stimulation  Comfort (Breast/Nipple): Soft / non-tender  Hold (Positioning): Assistance  needed to correctly position infant at breast and maintain latch.  LATCH Score: 9   Lactation Tools Discussed/Used    Interventions Interventions: Breast feeding basics reviewed;Assisted with latch;Skin to skin;Breast massage;Hand express;Breast compression;Adjust position;Support pillows;Position options;Expressed milk;Education  Discharge Pump: Personal WIC Program: Yes  Consult Status Consult Status: Follow-up Date: 09/26/20 Follow-up type: In-patient    Danelle Earthly 09/25/2020, 11:25 PM

## 2020-09-25 NOTE — Progress Notes (Addendum)
Upon admission to mother baby unit, patient stepped from wheelchair to bed and had gush of blood that went into the floor. With initial check, bleeding was appropriate with small trickle of blood; however, once patient settled into the bed and sat back up she stated that she felt more gushing. Upon assessment, patient's bleeding was heavy with small clots. Called for additional RN assistance and called Dr. Claiborne Billings to come assess patient. Dr. Claiborne Billings performed multiple fundal sweeps (see MD note) and ordered multiple medications (see MAR). Patient stable when Dr. Claiborne Billings left. Continued to check bleeding and VS Q10 minutes per Dr. Claiborne Billings order x1 hour. Dr. Claiborne Billings came to recheck patient at 1100 assessment, in which patient's bleeding is small with small amount of trickle with fundal massage. Dr. Claiborne Billings ordered labs for 1400. Earl Gala, Linda Hedges [facility]

## 2020-09-26 LAB — CBC
HCT: 20.6 % — ABNORMAL LOW (ref 36.0–46.0)
Hemoglobin: 7.1 g/dL — ABNORMAL LOW (ref 12.0–15.0)
MCH: 33.3 pg (ref 26.0–34.0)
MCHC: 34.5 g/dL (ref 30.0–36.0)
MCV: 96.7 fL (ref 80.0–100.0)
Platelets: 167 10*3/uL (ref 150–400)
RBC: 2.13 MIL/uL — ABNORMAL LOW (ref 3.87–5.11)
RDW: 13.2 % (ref 11.5–15.5)
WBC: 11.4 10*3/uL — ABNORMAL HIGH (ref 4.0–10.5)
nRBC: 0 % (ref 0.0–0.2)

## 2020-09-26 MED ORDER — LACTATED RINGERS IV BOLUS
500.0000 mL | Freq: Once | INTRAVENOUS | Status: AC
  Administered 2020-09-26: 500 mL via INTRAVENOUS

## 2020-09-26 MED ORDER — FERROUS SULFATE 325 (65 FE) MG PO TABS
325.0000 mg | ORAL_TABLET | Freq: Three times a day (TID) | ORAL | Status: DC
  Administered 2020-09-26 – 2020-09-27 (×3): 325 mg via ORAL
  Filled 2020-09-26 (×4): qty 1

## 2020-09-26 NOTE — Lactation Note (Signed)
This note was copied from a baby's chart. Lactation Consultation Note  Patient Name: Joy Hartman [id number] Date: 09/26/2020   Age:29 hours  Attempted to visit with mom but she told LC she needed to use the bathroom first and for LC to come back in 10 minutes or so. LC came back 15 minutes later and mom was still in the bathroom. Mom's visitor said she was given a laxative. LC to come back later today for F/U asst.  Maternal Data    Feeding    LATCH Score                    Lactation Tools Discussed/Used    Interventions    Discharge    Consult Status      Joy Hartman 09/26/2020, 1:17 PM

## 2020-09-26 NOTE — Progress Notes (Signed)
Dr Claiborne Billings called concerning pt headache she has had all day and seeing stars   Instr to give pt 500 cc bolus and tylenol

## 2020-09-26 NOTE — Progress Notes (Signed)
[name] was referred for history of panic attacks.  * Referral screened out by Clinical Social Worker because none of the following criteria appear to apply:  ~ History of panic attacks during this pregnancy, or of post-partum depression following prior delivery. ~ Diagnosis of panic attacks within last 3 years. (No concerns noted in prenatal care records).  OR * [name]'s symptoms currently being treated with medication and/or therapy.  Please contact the Clinical Social Worker if needs arise, by [facility] request, or if [name] scores greater than 9/yes to question 10 on Edinburgh Postpartum Depression Screen.  Dolores Frame, MSW, LCSW-A Clinical Social Worker [phone]

## 2020-09-26 NOTE — Lactation Note (Signed)
This note was copied from a baby's chart. Lactation Consultation Note  Patient Name: Joy Hartman [id number] Date: 09/26/2020 Reason for consult: Follow-up assessment;Term;Infant weight loss Age:29 hours  Visited with mom of 30 hours old FT female, she's a P2 and reports that baby is latching on well but he's still jittery, RN notified. The last 3 serum glucose yesterday were above 40 and WNL at 48, 50 and 41 respectively; baby has also been struggling to keep his temperatures above 97.   Mom was already finishing feeding baby when entered the room, baby was swaddled with two blankets, and latch could be deeper but mom voiced feedings at the breast felt comfortable. LC assisted with slightly repositioning baby closer to the breast and relatched, but he fell asleep shortly after.   While LC was setting up a DEBP and showing mom's sister how to use/clean it, baby started cueing again and went back to breast, this time the latch was deeper, a few audible swallows noted with breast compressions. Baby still nursing when exiting the room.   Reviewed newborn hypoglycemia, STS care, pumping schedule, cluster feeding, feeding cues and size of baby's stomach. Mom was also informed about the availability of donor milk if further supplementation were needed at some point  Feeding plan:  1. Encouraged mom to continue feeding baby STS 8-12 times/24 hours or sooner if feeding cues are present 2. Pumping every 3 hours was also recommended, mom will feed baby any amount of EBM she may get 3. Mom and caregiver will do as much STS as possible, covering baby with blankets when doing do  Mom's sister was her support person and she was actively involved during [facility] consultation. Family reported all questions and concerns were answered, they're both aware of LC OP services and will call PRN.  Maternal Data    Feeding Mother's Current Feeding Choice: Breast Milk  LATCH Score                     Lactation Tools Discussed/Used    Interventions Interventions: Breast feeding basics reviewed;Education  Discharge    Consult Status Consult Status: Follow-up Date: 09/27/20 Follow-up type: In-patient    Lorrane Mccay Venetia Constable 09/26/2020, 2:35 PM

## 2020-09-26 NOTE — Progress Notes (Signed)
Patient is eating, ambulating, voiding.  Pain control is good.  Pt reports bleeding only small amount since acute episdose of PPH yesterday.  Reports mild HA that comes and goes and tylenol helps.  Denies any CP/SOB or calf pain. Denies dizziness or other complaints.  Vitals:   09/25/20 1245 09/25/20 1718 09/25/20 2120 09/26/20 0625  BP: 119/85 129/72 139/80 117/75  Pulse: (!) 108 (!) 103 94 84  Resp: 18 18 18 18   Temp: 99.3 F (37.4 C) 98.8 F (37.1 C) 98 F (36.7 C) 98.2 F (36.8 C)  TempSrc: Oral Oral Oral Oral  SpO2: 100% 100% 100% 100%    Fundus firm Abd: soft, nontender Ext: no calf tenderness  Lab Results  Component Value Date   WBC 11.4 (H) 09/26/2020   HGB 7.1 (L) 09/26/2020   HCT 20.6 (L) 09/26/2020   MCV 96.7 09/26/2020   PLT 167 09/26/2020    --/--/O POS (04/30 0745)  A/P Post partum day 1 with PPH PPH approx 1800cc, s/p TXA, methergine x 1, rectal cytotec 10-11-1998 yesterday. Hb 4 hours post hemorrhage 8.9.  Bleeding since in normal range. Hb this morning 7.1 c/w with that EBL.  FeSO4 increased from bid to tid.  Patients vital are in normal range. Circ desired, consent obtained.  Per peds, baby with temp regulation issues and they would like to wait until tomorrow for circ.  Routine care.  Expect d/c 5/2.    7/2

## 2020-09-27 LAB — CBC
HCT: 18 % — ABNORMAL LOW (ref 36.0–46.0)
Hemoglobin: 6.1 g/dL — CL (ref 12.0–15.0)
MCH: 33.3 pg (ref 26.0–34.0)
MCHC: 33.9 g/dL (ref 30.0–36.0)
MCV: 98.4 fL (ref 80.0–100.0)
Platelets: 151 10*3/uL (ref 150–400)
RBC: 1.83 MIL/uL — ABNORMAL LOW (ref 3.87–5.11)
RDW: 13.2 % (ref 11.5–15.5)
WBC: 8.1 10*3/uL (ref 4.0–10.5)
nRBC: 0.2 % (ref 0.0–0.2)

## 2020-09-27 LAB — PREPARE RBC (CROSSMATCH)

## 2020-09-27 LAB — HEMOGLOBIN AND HEMATOCRIT, BLOOD
HCT: 22.7 % — ABNORMAL LOW (ref 36.0–46.0)
Hemoglobin: 7.9 g/dL — ABNORMAL LOW (ref 12.0–15.0)

## 2020-09-27 MED ORDER — IBUPROFEN 600 MG PO TABS: 600.0000 mg | ORAL_TABLET | Freq: Four times a day (QID) | ORAL | 0 refills | Status: DC

## 2020-09-27 MED ORDER — FERROUS SULFATE 325 (65 FE) MG PO TABS: 325.0000 mg | ORAL_TABLET | Freq: Two times a day (BID) | ORAL | 3 refills | Status: DC

## 2020-09-27 MED ORDER — ACETAMINOPHEN 325 MG PO TABS
650.0000 mg | ORAL_TABLET | Freq: Once | ORAL | Status: DC
  Filled 2020-09-27: qty 2

## 2020-09-27 MED ORDER — DIPHENHYDRAMINE HCL 25 MG PO CAPS
25.0000 mg | ORAL_CAPSULE | Freq: Once | ORAL | Status: AC
  Administered 2020-09-27: 25 mg via ORAL
  Filled 2020-09-27: qty 1

## 2020-09-27 MED ORDER — SODIUM CHLORIDE 0.9% IV SOLUTION: Freq: Once | INTRAVENOUS | Status: DC

## 2020-09-27 NOTE — Progress Notes (Signed)
Patient is eating, ambulating, voiding.  Pain control is good.  She reported HA with occ. Spots in her vision last night.  She reports bleeding is minimal and without blood clots.  She denies CP/SOB.  She feels generally pretty fatigued but ok.  She denies calf pain.  Vitals:   09/26/20 0625 09/26/20 1303 09/26/20 2000 09/27/20 0539  BP: 117/75 127/72 129/71 125/74  Pulse: 84 (!) 111 96 100  Resp: 18 18 18 18   Temp: 98.2 F (36.8 C) 98.2 F (36.8 C) 98.3 F (36.8 C) 98.7 F (37.1 C)  TempSrc: Oral Oral Oral Oral  SpO2: 100% 100% 100% 100%    Fundus firm Abd: soft, nontender Ext: no calf tenderness  Lab Results  Component Value Date   WBC 8.1 09/27/2020   HGB 6.1 (LL) 09/27/2020   HCT 18.0 (L) 09/27/2020   MCV 98.4 09/27/2020   PLT 151 09/27/2020    --/--/O POS (04/30 0745)  A/P Post partum day 2 Acute blood loss anemia- pt with PPH of approx 1800cc s/p TXA, methergine x 1 ad 10-11-1998 rectal cytotec at that time.  Thereafter bleeding resolved to normal expected pp bleeding.  Hb yesterday 7.1. Pt with HA and spots last night, received 500cc fluid bolus and tylenol, states HA resolved, but still seeing occ. Spots in vision.  She is otherwise asymptomatic and vital signs have been in normal range, with occ mild tachycardia, not currently however. This morning Hb 6.1.  Discussed possibility of transfusion, potential risks including infections and transfusion reactions.  Discussed general thresholds for transfusion in conjunction with clinical assessment.  Shared decision making with patient, with agreement to proceed with 2U PRBCs.  Will plan to recheck CBC approx 4-6 hours post transfusion.  Oncoming RN will alert oncoming Dr when transfusion is complete. Otherwise doing well.  Routine care.    

## 2020-09-27 NOTE — Discharge Instructions (Signed)
Please take one iron tab daily for your anemia.  If you tolerate it well, you may increase to one tab twice daily.  Iron often causes constipation, so please use over the counter miralax as needed

## 2020-09-27 NOTE — Lactation Note (Signed)
This note was copied from a baby's chart. Lactation Consultation Note  Patient Name: Joy Hartman NOIBB'C Date: 09/27/2020 Reason for consult: Follow-up assessment;Term;Infant weight loss;Other (Comment) (6 % weight loss, mom had a PPH - and is receiving blood this am. post circ / its been 4 hours since baby has eaten/ LC Placed STS  /  see LC Note) Age:29 hours  Per mom baby last fed at 6: 25 am for 39 mins. Baby was circ'd.  LC offered to check diaper / dry and placed baby STS and baby woke up .  LC assisted mom to latch due to mom receiving blood due to low Hbg.  Baby latched easily shallow and released. LC attempted and again and worked on depth/ increased swallows / baby fed 10 mins / Latch Score 9.  Per mom after being transfused may go home later today .  LC reviewed engorgement prevention and tx. See below for details.    Maternal Data Has patient been taught Hand Expression?: Yes (excellent flow -)  Feeding Mother's Current Feeding Choice: Breast Milk  LATCH Score Latch: Grasps breast easily, tongue down, lips flanged, rhythmical sucking.  Audible Swallowing: Spontaneous and intermittent  Type of Nipple: Everted at rest and after stimulation (some areola edema / reverse pressure improved the compressiblity of the areola)  Comfort (Breast/Nipple): Soft / non-tender  Hold (Positioning): Assistance needed to correctly position infant at breast and maintain latch.  LATCH Score: 9   Lactation Tools Discussed/Used    Interventions Interventions: Breast feeding basics reviewed;Assisted with latch;Skin to skin;Breast massage;Hand express;Reverse pressure;Adjust position;Support pillows;Position options;DEBP  Discharge Discharge Education: Engorgement and breast care Pump: Personal;DEBP  Consult Status Consult Status: Follow-up Date: 09/28/20 Follow-up type: In-patient    Matilde Sprang Rolf Fells 09/27/2020, 11:39 AM

## 2020-09-27 NOTE — Progress Notes (Signed)
CRITICAL VALUE STICKER  CRITICAL VALUE:6.1 hemoglobin  RECEIVER (on-site recipient of call): Doran Heater  DATE & TIME NOTIFIED: 09/27/2020 528  MESSENGER (representative from lab):julie  MD NOTIFIED: Dr. Claiborne Billings  TIME OF NOTIFICATION:546  RESPONSE:  States ok no orders given Patient is not having any s/s

## 2020-09-27 NOTE — Discharge Summary (Signed)
Postpartum Discharge Summary  Date of Service updated 09/27/20     Patient Name: Joy Hartman DOB: [birth year] MRN: [id number]  Date of admission: 09/25/2020 Delivery date:09/25/2020  Delivering provider: Allyn Kenner  Date of discharge: 09/27/2020  Admitting diagnosis: Normal labor [O80, Z37.9] Intrauterine pregnancy: [redacted]w[redacted]d    Secondary diagnosis:  Active Problems:   Normal labor  Additional problems: acute blood loss anemia    Discharge diagnosis: Term Pregnancy Delivered and [place]                                             Post partum procedures:blood transfusion Augmentation: N/A Complications: [id number] Hospital course: Onset of Labor With Vaginal Delivery      30y.o. yo [id number] admitted in Active Labor on 09/25/2020. Patient had an uncomplicated labor course as follows:  Membrane Rupture Time/Date: 7:40 AM ,09/25/2020   Delivery Method:Vaginal, Spontaneous  Episiotomy: None  Lacerations:  1st degree;Labial  Patient was transferred to postpartum, at which time she was noted to have heavy bleeding.  She had a PPH requiring txa, methergine, cytotec with total ebl approximately 1800 mL.  Her hemoglobin on ppd #1 was 7.1 and fell to 6.1 on PPD #2.  She received 1 unit PRBCs with an increase to 7.9.  She felt much improved symptomatically after the one unit, so decision was made to discharge home .  She is ambulating, tolerating a regular diet, passing flatus, and urinating well. Patient is discharged home in stable condition on 09/27/20.  Newborn Data: Birth date:09/25/2020  Birth time:7:42 AM  Gender:Female  Living status:Living  Apgars:7 ,9  Weight:3425 g   Magnesium Sulfate received: No BMZ received: No Rhophylac:No MMR:No Transfusion:Yes  Physical exam  Vitals:   09/27/20 0539 09/27/20 1015 09/27/20 1040 09/27/20 1220  BP: 125/74 107/87 128/79 120/80  Pulse: 100 (!) 117 (!) 112 (!) 102  Resp: '18 18 19 18  ' Temp: 98.7 F (37.1 C) 98.2  F (36.8 C) 98.3 F (36.8 C) 98.4 F (36.9 C)  TempSrc: Oral Oral Oral Oral  SpO2: 100% 100% 100% 100%   General: alert Lochia: appropriate Uterine Fundus: firm Incision: N/A DVT Evaluation: No evidence of DVT seen on physical exam. Labs: Lab Results  Component Value Date   WBC 8.1 09/27/2020   HGB 7.9 (L) 09/27/2020   HCT 22.7 (L) 09/27/2020   MCV 98.4 09/27/2020   PLT 151 09/27/2020   CMP Latest Ref Rng & Units 07/13/2020  Glucose 70 - 99 mg/dL 89  BUN 6 - 20 mg/dL <5(L)  Creatinine 0.44 - 1.00 mg/dL 0.42(L)  Sodium 135 - 145 mmol/L 134(L)  Potassium 3.5 - 5.1 mmol/L 3.7  Chloride 98 - 111 mmol/L 107  CO2 22 - 32 mmol/L 20(L)  Calcium 8.9 - 10.3 mg/dL 8.6(L)  Total Protein 6.5 - 8.1 g/dL 6.6  Total Bilirubin 0.3 - 1.2 mg/dL 0.1(L)  Alkaline Phos 38 - 126 U/L 70  AST 15 - 41 U/L 14(L)  ALT 0 - 44 U/L 12   Edinburgh Score: Edinburgh Postnatal Depression Scale Screening Tool 09/19/2018  I have been able to laugh and see the funny side of things. 0  I have looked forward with enjoyment to things. 0  I have blamed myself unnecessarily when things went wrong. 0  I have been anxious or worried for no  good reason. 0  I have felt scared or panicky for no good reason. 0  Things have been getting on top of me. 0  I have been so unhappy that I have had difficulty sleeping. 0  I have felt sad or miserable. 0  I have been so unhappy that I have been crying. 0  The thought of harming myself has occurred to me. 0  Edinburgh Postnatal Depression Scale Total 0      After visit meds:  Allergies as of 09/27/2020      Reactions   Codeine Hives, Itching, Rash, Swelling   Hydrocodone Itching, Swelling   Itching and swelling      Medication List    STOP taking these medications   Butalbital-APAP-Caffeine 50-300-40 MG Caps   metoCLOPramide 10 MG tablet Commonly known as: REGLAN   ondansetron 4 MG tablet Commonly known as: Zofran     TAKE these medications    acetaminophen 500 MG tablet Commonly known as: TYLENOL Take 1,000 mg by mouth every 6 (six) hours as needed.   albuterol 108 (90 Base) MCG/ACT inhaler Commonly known as: VENTOLIN HFA Inhale 2 puffs into the lungs every 4 (four) hours as needed for wheezing or shortness of breath.   ferrous sulfate 325 (65 FE) MG tablet Take 1 tablet (325 mg total) by mouth 2 (two) times daily with a meal.   fluticasone 50 MCG/ACT nasal spray Commonly known as: Flonase Place 1 spray into both nostrils daily.   ibuprofen 600 MG tablet Commonly known as: ADVIL Take 1 tablet (600 mg total) by mouth every 6 (six) hours.   prenatal multivitamin Tabs tablet Take 1 tablet by mouth daily at 12 noon.        Discharge home in stable condition Infant Feeding: Breast Infant Disposition:home with mother Discharge instruction: per After Visit Summary and Postpartum booklet. Activity: Advance as tolerated. Pelvic rest for 6 weeks.  Diet: routine diet Postpartum Appointment:4 weeks Future Appointments: Future Appointments  Date Time Provider [place]  12/07/2020  9:30 AM PCC-PROVIDER [facility] None  12/13/2020  9:30 AM Star Age, MD [facility] None   Follow up Visit:  Follow-up Information    Allyn Kenner, DO Follow up in 4 week(s).   Specialty: Obstetrics and Gynecology Contact information: [address] [place] [address] [phone]                   09/27/2020 [facility] Lars Masson, MD

## 2020-09-27 NOTE — Progress Notes (Signed)
Patient seen and examined.  She was asleep upon my arrival to the room and had been resting during the transfusion of 1 unit of PRBCs, which just finished.  She has no complaints currently.  She strongly desires discharge to home today to see her older son  Vital signs repeated while I was in room--pulse 102, BP 120/70s  A/P:  Will ambulate and repeat H/H in two hours.  If appropriate rise in H/H and if patient is asymptomatic with ambulation, will consider discharge to home

## 2020-09-28 ENCOUNTER — Telehealth: Payer: Self-pay | Admitting: *Deleted

## 2020-09-28 ENCOUNTER — Emergency Department (HOSPITAL_COMMUNITY): Payer: Medicaid Other

## 2020-09-28 ENCOUNTER — Other Ambulatory Visit: Payer: Self-pay

## 2020-09-28 ENCOUNTER — Inpatient Hospital Stay (HOSPITAL_COMMUNITY)
Admission: EM | Admit: 2020-09-28 | Discharge: 2020-09-30 | DRG: 776 | Disposition: A | Discharge status: Home / Self Care | Payer: Medicaid Other | Attending: Obstetrics & Gynecology | Admitting: Obstetrics & Gynecology

## 2020-09-28 DIAGNOSIS — R569 Unspecified convulsions: Secondary | ICD-10-CM | POA: Diagnosis not present

## 2020-09-28 DIAGNOSIS — R404 Transient alteration of awareness: Secondary | ICD-10-CM | POA: Diagnosis not present

## 2020-09-28 DIAGNOSIS — R42 Dizziness and giddiness: Secondary | ICD-10-CM | POA: Diagnosis not present

## 2020-09-28 DIAGNOSIS — O159 Eclampsia, unspecified as to time period: Secondary | ICD-10-CM | POA: Diagnosis not present

## 2020-09-28 DIAGNOSIS — R55 Syncope and collapse: Secondary | ICD-10-CM | POA: Diagnosis not present

## 2020-09-28 DIAGNOSIS — O99893 Other specified diseases and conditions complicating puerperium: Secondary | ICD-10-CM | POA: Diagnosis present

## 2020-09-28 DIAGNOSIS — R000 Tachycardia, unspecified: Secondary | ICD-10-CM | POA: Diagnosis not present

## 2020-09-28 DIAGNOSIS — O152 Eclampsia in the puerperium: Principal | ICD-10-CM | POA: Diagnosis present

## 2020-09-28 DIAGNOSIS — G40909 Epilepsy, unspecified, not intractable, without status epilepticus: Secondary | ICD-10-CM | POA: Diagnosis not present

## 2020-09-28 DIAGNOSIS — Z20822 Contact with and (suspected) exposure to covid-19: Secondary | ICD-10-CM | POA: Diagnosis present

## 2020-09-28 LAB — TYPE AND SCREEN
ABO/RH(D): O POS
Antibody Screen: NEGATIVE
Unit division: 0

## 2020-09-28 LAB — CBC WITH DIFFERENTIAL/PLATELET
Abs Immature Granulocytes: 0.1 10*3/uL — ABNORMAL HIGH (ref 0.00–0.07)
Basophils Absolute: 0 10*3/uL (ref 0.0–0.1)
Basophils Relative: 0 %
Eosinophils Absolute: 0.3 10*3/uL (ref 0.0–0.5)
Eosinophils Relative: 2 %
HCT: 24.3 % — ABNORMAL LOW (ref 36.0–46.0)
Hemoglobin: 8.4 g/dL — ABNORMAL LOW (ref 12.0–15.0)
Immature Granulocytes: 1 %
Lymphocytes Relative: 14 %
Lymphs Abs: 1.6 10*3/uL (ref 0.7–4.0)
MCH: 32.6 pg (ref 26.0–34.0)
MCHC: 34.6 g/dL (ref 30.0–36.0)
MCV: 94.2 fL (ref 80.0–100.0)
Monocytes Absolute: 0.6 10*3/uL (ref 0.1–1.0)
Monocytes Relative: 5 %
Neutro Abs: 9 10*3/uL — ABNORMAL HIGH (ref 1.7–7.7)
Neutrophils Relative %: 78 %
Platelets: 231 10*3/uL (ref 150–400)
RBC: 2.58 MIL/uL — ABNORMAL LOW (ref 3.87–5.11)
RDW: 14.7 % (ref 11.5–15.5)
WBC: 11.6 10*3/uL — ABNORMAL HIGH (ref 4.0–10.5)
nRBC: 0 % (ref 0.0–0.2)

## 2020-09-28 LAB — COMPREHENSIVE METABOLIC PANEL
ALT: 26 U/L (ref 0–44)
AST: 35 U/L (ref 15–41)
Albumin: 2.6 g/dL — ABNORMAL LOW (ref 3.5–5.0)
Alkaline Phosphatase: 82 U/L (ref 38–126)
Anion gap: 9 (ref 5–15)
BUN: 8 mg/dL (ref 6–20)
CO2: 22 mmol/L (ref 22–32)
Calcium: 8.8 mg/dL — ABNORMAL LOW (ref 8.9–10.3)
Chloride: 106 mmol/L (ref 98–111)
Creatinine, Ser: 0.55 mg/dL (ref 0.44–1.00)
GFR, Estimated: >60 mL/min (ref >60)
Glucose, Bld: 81 mg/dL (ref 70–99)
Potassium: 3.6 mmol/L (ref 3.5–5.1)
Sodium: 137 mmol/L (ref 135–145)
Total Bilirubin: 0.5 mg/dL (ref 0.3–1.2)
Total Protein: 6.3 g/dL — ABNORMAL LOW (ref 6.5–8.1)

## 2020-09-28 LAB — BPAM RBC
Blood Product Expiration Date: 202206012359
ISSUE DATE / TIME: 202205021012
Unit Type and Rh: 5100

## 2020-09-28 MED ORDER — MAGNESIUM SULFATE 4 GM/100ML IV SOLN
4.0000 g | Freq: Once | INTRAVENOUS | Status: AC
  Administered 2020-09-28: 4 g via INTRAVENOUS
  Filled 2020-09-28: qty 100

## 2020-09-28 MED ORDER — MAGNESIUM SULFATE 40 GM/1000ML IV SOLN
2.0000 g/h | INTRAVENOUS | Status: DC
  Administered 2020-09-28: 2 g/h via INTRAVENOUS
  Filled 2020-09-28: qty 1000

## 2020-09-28 MED ORDER — LACTATED RINGERS IV SOLN: INTRAVENOUS | Status: DC

## 2020-09-28 MED ORDER — GADOBUTROL 1 MMOL/ML IV SOLN
8.5000 mL | Freq: Once | INTRAVENOUS | Status: AC | PRN
  Administered 2020-09-28: 8.5 mL via INTRAVENOUS

## 2020-09-28 MED ORDER — LORAZEPAM 2 MG/ML IJ SOLN
INTRAMUSCULAR | Status: AC
  Filled 2020-09-28: qty 1

## 2020-09-28 NOTE — ED Notes (Signed)
Pt transported to MRI. RN to MRI with pt to connect IV mag to MRI pump.

## 2020-09-28 NOTE — ED Provider Notes (Signed)
I personally evaluated the patient during the encounter and completed a history, physical, procedures, medical decision making to contribute to the overall care of the patient and decision making for the patient briefly, the patient is a 30 y.o. female seizure-like activity.  4 days postpartum.  No complications.  Hypertensive in the 150s with EMS.  She was given Versed.  No seizure-like activity upon my evaluation.  She does have a slight tremor throughout on exam.  She follows commands.  Appears neurologically intact.  Vital signs are otherwise unremarkable.  Has been having a headache.  OB/GYN has been contacted and will start treatment for eclampsia with magnesium.  Neurology also has been consulted and will get stat EEG and MRV and MRI of the brain.  Overall suspect eclampsia.  Will get neurology clearance before likely admitting to OB/GYN assuming neurological work-up otherwise is unremarkable.  This chart was dictated using voice recognition software.  Despite best efforts to proofread,  errors can occur which can change the documentation meaning.     EKG Interpretation None           Virgina Norfolk, DO 09/28/20 2153

## 2020-09-28 NOTE — ED Notes (Signed)
Per PA give Mag at 400 mL/hr.

## 2020-09-28 NOTE — Consult Note (Signed)
Neurology Consultation  Reason for Consult: Concern for seizures, headaches Referring Physician: Dr. Lockie Mola  CC: Episode of unresponsiveness, concern for seizures, headaches  History is obtained from: Patient, friend at bedside, sister on FaceTime  HPI: Joy Hartman is a 30 y.o. female recently postpartum with postpartum hemorrhage with estimated blood loss of 1800 cc requiring TXA and transfusions on 09/27/2020, presenting via EMS when family noted her being unresponsive.  She was in and out of consciousness and started to have rhythmic shaking of her whole body in the house and also in the EMS truck.  She received 5 mg of Versed.  Currently 4 days postpartum.  Reports a mild headache as well.  This all started when she was sitting on the toilet. Her recent pregnancy was complicated with complaints of headache throughout the pregnancy, but she went full-term with complications of hemorrhage requiring blood transfusions. After the episode of unresponsiveness and body shaking, she was lethargic-concern for postictal state. She has had vertigo in the past but never has had episodes of passing out or losing consciousness according to the sister. Patient was very drowsy during the time of his examination and was not able to provide very consistent history.  Today, no tongue biting, no loss of bowel or bladder control.  Reports feeling weak all over and cold.  Labs in the ER revealed a normal CMP, hemoglobin 8.4, hematocrit 24.3, platelet count 231.  WBC 11.6.  ROS: Full ROS was performed and is negative except as noted in the HPI.   Past Medical History:  Diagnosis Date  . Asthma   . Chlamydia   . Gonorrhea   . Headache   . Panic attack   . Trichomonas     Family History  Problem Relation Age of Onset  . Asthma Mother   . Hypertension Mother   . Hypertension Father   . Hypertension Maternal Grandmother   . Hypertension Maternal Grandfather   . Hypertension Paternal Grandmother    . Hypertension Paternal Grandfather     Social History:   reports that she has never smoked. She has never used smokeless tobacco. She reports previous drug use. Drug: Marijuana. She reports that she does not drink alcohol.  Medications  Current Facility-Administered Medications:  .  lactated ringers infusion, , Intravenous, Continuous, Renne Crigler, PA-C, Last Rate: 10 mL/hr at 09/28/20 1925, New Bag at 09/28/20 1925 .  LORazepam (ATIVAN) 2 MG/ML injection, , , ,  .  magnesium sulfate 40 grams in SWI 1000 mL OB infusion, 2 g/hr, Intravenous, Titrated, Geiple, Joshua, PA-C, Last Rate: 50 mL/hr at 09/28/20 1945, 2 g/hr at 09/28/20 1945  Current Outpatient Medications:  .  acetaminophen (TYLENOL) 500 MG tablet, Take 1,000 mg by mouth every 6 (six) hours as needed., Disp: , Rfl:  .  fluticasone (FLONASE) 50 MCG/ACT nasal spray, Place 1 spray into both nostrils daily., Disp: 1 g, Rfl: 0 .  Prenatal Vit-Fe Fumarate-FA (PRENATAL MULTIVITAMIN) TABS tablet, Take 1 tablet by mouth daily at 12 noon., Disp: , Rfl:  .  albuterol (VENTOLIN HFA) 108 (90 Base) MCG/ACT inhaler, Inhale 2 puffs into the lungs every 4 (four) hours as needed for wheezing or shortness of breath. (Patient not taking: Reported on 09/28/2020), Disp: 6.7 g, Rfl: 2 .  ferrous sulfate 325 (65 FE) MG tablet, Take 1 tablet (325 mg total) by mouth 2 (two) times daily with a meal. (Patient not taking: Reported on 09/28/2020), Disp: 60 tablet, Rfl: 3 .  ibuprofen (ADVIL) 600 MG  tablet, Take 1 tablet (600 mg total) by mouth every 6 (six) hours. (Patient not taking: Reported on 09/28/2020), Disp: 30 tablet, Rfl: 0   Exam: Current vital signs: BP 130/77   Pulse (!) 104   Temp 99.1 F (37.3 C) (Oral)   Resp 16   Ht 5\' 7"  (1.702 m)   Wt 86.6 kg   LMP 12/22/2019   SpO2 100%   BMI 29.91 kg/m  Vital signs in last 24 hours: Temp:  [99.1 F (37.3 C)] 99.1 F (37.3 C) (05/03 1812) Pulse Rate:  [104-138] 104 (05/03 2030) Resp:  [10-29] 16  (05/03 2015) BP: (107-142)/(68-91) 130/77 (05/03 2030) SpO2:  [96 %-100 %] 100 % (05/03 2030) Weight:  [86.6 kg] 86.6 kg (05/03 1908) General: Drowsy no distress HEENT: Normocephalic atraumatic Lungs: Clear with no wheezing Cardiovascular: Mildly tachycardic Abdomen soft, mild tenderness Extremities warm well perfused Neurological exam Drowsy, able to answer questions appropriately. Unable to tell me why she is in the hospital but knows that she is on [facility] Able to tell me the date and time and her age correctly No dysarthria No aphasia Cranial nerves II to XII intact Motor exam with some effort dependent weakness but nearly symmetric 5/5 strength on coaching.  No vertical drift in any of the 4 extremities. Sensory exam intact to light touch without extinction Coordination with no dysmetria in upper extremities.  Difficulty performing the lower extremities due to discomfort. Gait testing deferred  Labs I have reviewed labs in epic and the results pertinent to this consultation are:   CBC    Component Value Date/Time   WBC 11.6 (H) 09/28/2020 1901   RBC 2.58 (L) 09/28/2020 1901   HGB 8.4 (L) 09/28/2020 1901   HGB 12.1 09/08/2011 0000   HCT 24.3 (L) 09/28/2020 1901   HCT 36 09/08/2011 0000   PLT 231 09/28/2020 1901   PLT 216 09/08/2011 0000   MCV 94.2 09/28/2020 1901   MCH 32.6 09/28/2020 1901   MCHC 34.6 09/28/2020 1901   RDW 14.7 09/28/2020 1901   LYMPHSABS 1.6 09/28/2020 1901   MONOABS 0.6 09/28/2020 1901   EOSABS 0.3 09/28/2020 1901   BASOSABS 0.0 09/28/2020 1901    CMP     Component Value Date/Time   NA 137 09/28/2020 1901   K 3.6 09/28/2020 1901   CL 106 09/28/2020 1901   CO2 22 09/28/2020 1901   GLUCOSE 81 09/28/2020 1901   BUN 8 09/28/2020 1901   CREATININE 0.55 09/28/2020 1901   CREATININE 0.56 03/30/2014 1627   CALCIUM 8.8 (L) 09/28/2020 1901   PROT 6.3 (L) 09/28/2020 1901   ALBUMIN 2.6 (L) 09/28/2020 1901   AST 35 09/28/2020 1901    ALT 26 09/28/2020 1901   ALKPHOS 82 09/28/2020 1901   BILITOT 0.5 09/28/2020 1901   GFRNONAA >60 09/28/2020 1901   GFRAA >60 06/15/2018 2213    Imaging I have reviewed the images obtained: no brain imaging to review at the time of consult. Recs provided to obtain MRI brain w+w/o and MRV head.  See addendum  Assessment:  30 year old man with recent postpartum hemorrhage with estimated blood loss of 1800 cc requiring transfusions and TXA presenting for episode of unresponsiveness and whole body shaking while she was on the toilet. Differentials at this time include eclampsia versus vasovagal syncope versus seizures. No history of seizures either personal or family history. Due to the history of blood loss, it is imperative to rule out any hypoperfusion injury to the  brain including watershed infarctions. Needs further work-up with EEG and brain imaging. Also complains of a new headache-an MRV or CTV would be reasonable to rule out dural venous sinus thrombosis.  Recommendations: MRI brain with and without contrast MRV head Stat EEG - technologist aware but pt was in MRI - will be attempted at the next availability in the AM No need for antiepileptics for now Discussed with ED provider Dr. Lockie Mola. Will follow once imaging studies become available.  -- Milon Dikes, MD Neurologist [facility] Pager: [phone]  ADDENDUM I have reviewed the images obtained-MRI brain and MRV head unremarkable for acute process.  No evidence of dural venous sinus thrombosis. EEG still pending We will follow-up on the EEG I would continue to hold off on any antiepileptics and manage per OB.  Plan discussed with the OB team and ED provider Dr. Lockie Mola over secure chat.  -- Milon Dikes, MD Neurologist [facility] Pager: [phone]

## 2020-09-28 NOTE — ED Triage Notes (Signed)
Patient brought in by [facility] for seizures.  EMS states that the family called for the patient being unresponsive.  Patient was in and out of consciousness and started to seize in the back of EMS truck.  Patient received 5mg  of versed.  Patient is 4 days post partum.

## 2020-09-28 NOTE — ED Notes (Signed)
Pt returned from MRI. Pt placed back on monitor.

## 2020-09-28 NOTE — Progress Notes (Signed)
Per nurse pt on the way to MRI, will check back later.

## 2020-09-28 NOTE — H&P (Addendum)
Joy Hartman is a 30 y.o. female [phone] PPD#3 presented to ED via EMS for concern for seizures.    Per ED " Patient brought in by [facility] for seizures.  EMS states that the family called for the patient being unresponsive.  Patient was in and out of consciousness and started to seize in the back of EMS truck.  Patient received 5mg  of versed.  Patient is 4 days post partum " Patient has a history of vertigo and HA. No history of seizures or hypertension. Presented to ED accompanied by friend who says patient called her not feeling well, reported a HA. Found her on the toilet, and patient was shaking, concern for seizure.  Patient had a precipitous delivery 09/25/2020 with a postpartum hemorrhage. EBL 1800cc, received TXA, methergine, cytotec in addition to routine pitocin. On 5/2 her Hgb was 6.1, she received 1u pRBC 5/2. She was discharged from postpartum 5/2.  Blood pressures were normal in pregnancy and during immediate postpartum period. Per ED provider, EMS reported mild range BP during transfer.   Pregnancy c/b: COVID 06/02/2020 Asthma rare albuterol use  OB History    Gravida  3   Para  2   Term  2   Preterm  0   AB  1   Living  2     SAB  1   IAB  0   Ectopic  0   Multiple  0   Live Births  2          Past Medical History:  Diagnosis Date  . Asthma   . Chlamydia   . Gonorrhea   . Headache   . Panic attack   . Trichomonas    No past surgical history on file. Family History: family history includes Asthma in her mother; Hypertension in her father, maternal grandfather, maternal grandmother, mother, paternal grandfather, and paternal grandmother. Social History:  reports that she has never smoked. She has never used smokeless tobacco. She reports previous drug use. Drug: Marijuana. She reports that she does not drink alcohol.  Review of Systems Per HPI Exam Physical Exam    Patient Vitals for the past 24 hrs:  BP Temp Temp src Pulse Resp SpO2 Height  Weight  09/29/20 0003 -- 98.1 F (36.7 C) Rectal -- -- -- -- --  09/29/20 0000 110/71 -- -- 99 (!) 23 98 % -- --  09/28/20 2345 118/79 -- -- (!) 109 (!) 21 98 % -- --  09/28/20 2330 113/80 -- -- 82 (!) 23 100 % -- --  09/28/20 2315 123/75 -- -- (!) 101 (!) 23 100 % -- --  09/28/20 2312 126/73 -- -- (!) 102 -- 100 % -- --  09/28/20 2130 121/65 -- -- (!) 106 (!) 26 99 % -- --  09/28/20 2115 106/69 -- -- (!) 108 (!) 26 98 % -- --  09/28/20 2100 119/72 -- -- (!) 107 (!) 25 98 % -- --  09/28/20 2045 126/83 -- -- (!) 107 14 99 % -- --  09/28/20 2030 130/77 -- -- (!) 104 -- 100 % -- --  09/28/20 2015 130/80 -- -- (!) 112 16 100 % -- --  09/28/20 2000 136/77 -- -- (!) 105 20 100 % -- --  09/28/20 1945 (!) 142/85 -- -- (!) 107 20 100 % -- --  09/28/20 1930 116/68 -- -- (!) 108 (!) 22 96 % -- --  09/28/20 1915 124/82 -- -- (!) 106 10 100 % -- --  09/28/20 1908 -- -- -- -- -- -- 5\' 7"  (1.702 m) 86.6 kg  09/28/20 1900 (!) 128/91 -- -- (!) 113 20 99 % -- --  09/28/20 1845 107/74 -- -- (!) 138 (!) 29 100 % -- --  09/28/20 1830 131/85 -- -- (!) 113 -- 100 % -- --  09/28/20 1815 132/81 -- -- (!) 115 12 100 % -- --  09/28/20 1812 135/89 99.1 F (37.3 C) Oral (!) 115 14 99 % -- --    NAD, resting comfortably, drowsy on exam, does not answer many questions Card: RRR Pulm: CTAB Lower extremity: trace edema, no evidence of DVT Upper and lower extremity strength 5/5 and sensation grossly intact  Chemistry Recent Labs  Lab 09/28/20 1901  NA 137  K 3.6  CL 106  CO2 22  GLUCOSE 81  BUN 8  CREATININE 0.55  CALCIUM 8.8*  GFRNONAA >60  ANIONGAP 9    Recent Labs  Lab 09/28/20 1901  PROT 6.3*  ALBUMIN 2.6*  AST 35  ALT 26  ALKPHOS 82  BILITOT 0.5   Hematology Recent Labs  Lab 09/26/20 0426 09/27/20 0443 09/27/20 1423 09/28/20 1901  WBC 11.4* 8.1  --  11.6*  RBC 2.13* 1.83*  --  2.58*  HGB 7.1* 6.1* 7.9* 8.4*  HCT 20.6* 18.0* 22.7* 24.3*  MCV 96.7 98.4  --  94.2  MCH 33.3 33.3   --  32.6  MCHC 34.5 33.9  --  34.6  RDW 13.2 13.2  --  14.7  PLT 167 151  --  231   EXAM: MR VENOGRAM OF THE HEAD WITHOUT CONTRAST  TECHNIQUE: Angiographic images of the intracranial venous structures were obtained using MRV technique without intravenous contrast.  COMPARISON:  None.  FINDINGS: Superior sagittal sinus: Normal.  Straight sinus: Normal.  Inferior sagittal sinus, vein of Galen and internal cerebral veins: Normal.  Transverse sinuses: Normal.  Sigmoid sinuses: Normal.  Visualized jugular veins: Normal.  IMPRESSION: Normal intracranial MRV.  Electronically Signed   By: 11/28/20 M.D.   On: 09/28/2020 23:31  CLINICAL DATA:  Seizure and delirium  EXAM: MRI HEAD WITHOUT AND WITH CONTRAST  TECHNIQUE: Multiplanar, multiecho pulse sequences of the brain and surrounding structures were obtained without and with intravenous contrast.  CONTRAST:  8.60mL GADAVIST GADOBUTROL 1 MMOL/ML IV SOLN  COMPARISON:  None.  FINDINGS: Brain: No acute infarct, mass effect or extra-axial collection. No acute or chronic hemorrhage. Normal white matter signal, parenchymal volume and CSF spaces. The midline structures are normal. There is no abnormal contrast enhancement.  Vascular: Major flow voids are preserved.  Skull and upper cervical spine: Normal calvarium and skull base. Visualized upper cervical spine and soft tissues are normal.  Sinuses/Orbits:No paranasal sinus fluid levels or advanced mucosal thickening. No mastoid or middle ear effusion. Normal orbits.  IMPRESSION: Normal brain MRI.   Electronically Signed   By: 4m M.D.   On: 09/28/2020 23:27   Assessment/Plan: Joy Hartman is a 30 y.o. female 26 PPD#4 with concern for seizures, consistent with eclampsia 1. Concern for seizures: reported seizure like activity by friend at home, per ED provider Dr. [id number] patient with slight tremor during exam but no  seizure activity during his initial evaluation. EEG ordered by Neurology -I discussed with patient, friend and her sister (via facetime) that likely eclampsia given reported seizure activity at home, mild range BP. CBC/CMP wnl. Will order UPC (althought clean catch may be false in setting of lochia). Given 4g  magnesium bolus in ED, continue 2g/h for 24 hours. -Normotensive to mild range, will treat hypertension prn -Repeat CBC/CMP in 12 hours -NPO for now, will transition to clears, regular diet -Seizure precautions  -Evaluated by Neurology in ED. Imaging normal. EEG pending 2. Postpartum: tylenol/motrin prn for pain. Currently breastfeeding, reviewed with Lactation, ok to continue to breastfeed/pump after receiving contrast for MRI  Frankee Gritz K Taam-Akelman 09/29/2020, 12:38 AM

## 2020-09-28 NOTE — ED Provider Notes (Addendum)
[facility] EMERGENCY DEPARTMENT Provider Note   CSN: [id number] Arrival date & time: 09/28/20  1800     History Chief Complaint  Patient presents with  . Seizures    Joy Hartman is a 30 y.o. female.  Patient who gave birth on 09/25/20, discharged from hospital yesterday after receiving blood transfusion (Hgb 8.9 > 7.1 > 6.1 > 7.9 since delivery) presents after having a seizure.  Level 5 caveat due to postictal state.  Additional history from family member bedside.  Patient began feeling weak and had shaking at home.  She tried to use the toilet.  Family Amber states that patient reported her legs became very numb.  Shortly before 4:00, family member arrived and found patient to be responsive but zoned out.  She was having shaking of her arms and legs.  EMS was called for this reason.  EMS reported that patient seemed postictal when they arrived.  Shortly after getting her in the back of the ambulance, she had another seizure lasting about 1-2 minutes.  They quickly gave 5 mg of Versed.  No magnesium was given.  Shaking stopped and patient has been postictal, gradually becoming more alert during transport.  Blood pressure was 150/90 with fire, close to normal with EMS.        Past Medical History:  Diagnosis Date  . Asthma   . Chlamydia   . Gonorrhea   . Headache   . Panic attack   . Trichomonas     Patient Active Problem List   Diagnosis Date Noted  . Normal labor 09/25/2020  . History of COVID-19 09/07/2020  . Chronic nonintractable headache 09/07/2020  . Vaginal bleeding in pregnancy, third trimester 07/18/2020  . Chlamydia 09/04/2018  . Bilateral hand swelling 11/09/2014  . Panic attacks 03/30/2014  . Elevated random blood glucose level 03/30/2014  . Asthma 07/13/2010  . HEADACHE 07/13/2010    No past surgical history on file.   OB History    Gravida  3   Para  2   Term  2   Preterm  0   AB  1   Living  2     SAB  1   IAB  0    Ectopic  0   Multiple  0   Live Births  2           Family History  Problem Relation Age of Onset  . Asthma Mother   . Hypertension Mother   . Hypertension Father   . Hypertension Maternal Grandmother   . Hypertension Maternal Grandfather   . Hypertension Paternal Grandmother   . Hypertension Paternal Grandfather     Social History   Tobacco Use  . Smoking status: Never Smoker  . Smokeless tobacco: Never Used  Vaping Use  . Vaping Use: Never used  Substance Use Topics  . Alcohol use: No  . Drug use: Not Currently    Types: Marijuana    Home Medications Prior to Admission medications   Medication Sig Start Date End Date Taking? Authorizing Provider  acetaminophen (TYLENOL) 500 MG tablet Take 1,000 mg by mouth every 6 (six) hours as needed.    [provider]  albuterol (VENTOLIN HFA) 108 (90 Base) MCG/ACT inhaler Inhale 2 puffs into the lungs every 4 (four) hours as needed for wheezing or shortness of breath. 06/02/20   Gita Kudo, MD  ferrous sulfate 325 (65 FE) MG tablet Take 1 tablet (325 mg total) by mouth 2 (  two) times daily with a meal. 09/27/20   Marlow Baars, MD  fluticasone (FLONASE) 50 MCG/ACT nasal spray Place 1 spray into both nostrils daily. 06/02/20 06/02/21  Gita Kudo, MD  ibuprofen (ADVIL) 600 MG tablet Take 1 tablet (600 mg total) by mouth every 6 (six) hours. 09/27/20   Marlow Baars, MD  Prenatal Vit-Fe Fumarate-FA (PRENATAL MULTIVITAMIN) TABS tablet Take 1 tablet by mouth daily at 12 noon.    [provider]    Allergies    Codeine and Hydrocodone  Review of Systems   Review of Systems  Neurological: Positive for seizures.    Physical Exam Updated Vital Signs BP 135/89 (BP Location: Right Arm)   Pulse (!) 115   Temp 99.1 F (37.3 C) (Oral)   Resp 14   LMP 12/22/2019   SpO2 99%   Physical Exam Vitals and nursing note reviewed.  Constitutional:      General: She is not in acute distress.    Appearance: She  is well-developed.  HENT:     Head: Normocephalic and atraumatic.     Right Ear: External ear normal.     Left Ear: External ear normal.     Nose: Nose normal.  Eyes:     Conjunctiva/sclera: Conjunctivae normal.  Cardiovascular:     Rate and Rhythm: Regular rhythm. Tachycardia present.     Heart sounds: No murmur heard.   Pulmonary:     Effort: No respiratory distress.     Breath sounds: No wheezing, rhonchi or rales.  Abdominal:     Palpations: Abdomen is soft.     Tenderness: There is no abdominal tenderness. There is no guarding or rebound.  Musculoskeletal:     Cervical back: Normal range of motion and neck supple.     Right lower leg: No edema.     Left lower leg: No edema.  Skin:    General: Skin is warm and dry.     Findings: No rash.  Neurological:     General: No focal deficit present.     Mental Status: She is alert.     Motor: No weakness.     Comments: Follows commands, opens her eyes, sticks out her tongue, squeezes my hands and wiggles her toes when prompted.  Her speech is slurred.  Psychiatric:        Mood and Affect: Mood normal.     ED Results / Procedures / Treatments   Labs (all labs ordered are listed, but only abnormal results are displayed) Labs Reviewed  CBC WITH DIFFERENTIAL/PLATELET - Abnormal; Notable for the following components:      Result Value   WBC 11.6 (*)    RBC 2.58 (*)    Hemoglobin 8.4 (*)    HCT 24.3 (*)    Neutro Abs 9.0 (*)    Abs Immature Granulocytes 0.10 (*)    All other components within normal limits  COMPREHENSIVE METABOLIC PANEL - Abnormal; Notable for the following components:   Calcium 8.8 (*)    Total Protein 6.3 (*)    Albumin 2.6 (*)    All other components within normal limits  SARS CORONAVIRUS 2 (TAT 6-24 HRS)  URINALYSIS, ROUTINE W REFLEX MICROSCOPIC    EKG None  Radiology MR BRAIN W WO CONTRAST  Result Date: 09/28/2020 CLINICAL DATA:  Seizure and delirium EXAM: MRI HEAD WITHOUT AND WITH CONTRAST  TECHNIQUE: Multiplanar, multiecho pulse sequences of the brain and surrounding structures were obtained without and with intravenous contrast. CONTRAST:  8.525mL GADAVIST GADOBUTROL 1 MMOL/ML IV SOLN COMPARISON:  None. FINDINGS: Brain: No acute infarct, mass effect or extra-axial collection. No acute or chronic hemorrhage. Normal white matter signal, parenchymal volume and CSF spaces. The midline structures are normal. There is no abnormal contrast enhancement. Vascular: Major flow voids are preserved. Skull and upper cervical spine: Normal calvarium and skull base. Visualized upper cervical spine and soft tissues are normal. Sinuses/Orbits:No paranasal sinus fluid levels or advanced mucosal thickening. No mastoid or middle ear effusion. Normal orbits. IMPRESSION: Normal brain MRI. Electronically Signed   By: Deatra RobinsonKevin  Herman M.D.   On: 09/28/2020 23:27    Procedures Procedures   Medications Ordered in ED Medications  LORazepam (ATIVAN) 2 MG/ML injection (has no administration in time range)  magnesium sulfate 40 grams in SWI 1000 mL OB infusion (2 g/hr Intravenous New Bag/Given 09/28/20 1945)  lactated ringers infusion ( Intravenous New Bag/Given 09/28/20 1925)  magnesium sulfate IVPB 4 g 100 mL (0 g Intravenous Stopped 09/28/20 1925)  gadobutrol (GADAVIST) 1 MMOL/ML injection 8.5 mL (8.5 mLs Intravenous Contrast Given 09/28/20 2304)  gadobutrol (GADAVIST) 1 MMOL/ML injection 8.5 mL (8.5 mLs Intravenous Contrast Given 09/28/20 2311)    ED Course  I have reviewed the triage vital signs and the nursing notes.  Pertinent labs & imaging results that were available during my care of the patient were reviewed by me and considered in my medical decision making (see chart for details).  Patient seen and examined at arrival.  Spoke with EMS.  Patient is postictal, following commands and becoming more alert per EMS.  Magnesium ordered.  Labs ordered.  Head CT ordered.  Vital signs reviewed and are as follows: BP  135/89 (BP Location: Right Arm)   Pulse (!) 115   Temp 99.1 F (37.3 C) (Oral)   Resp 14   LMP 12/22/2019   SpO2 99%   6:19 PM I spoke with family member now at bedside and another family member by video chat.  They confirm that she needed a blood transfusion prior to discharge yesterday.  No reported complications including hypertension leading up to delivery.  Patient has never had a seizure.  6:42 PM Called to room for possible seizure. Pt responsive, tearing, gentle rhythmic shaking. Will monitor. Seen at bedside with Dr. Lockie Molauratolo. Added venogram for when patient can get head imaging. Will discuss with OB/GYN now.   7:00 PM Pt had another 1-2 min episode of shaking, this time not responsive. This stopped spontaneously and she became more alert.   [facility] so [facility] to them. Will continue to monitor.   7:07 PM Spoke with Dr. Claudine Moutonaam -- she has reviewed work-up and agrees. Agrees 4mg  Mg bolus, 2mg /hr after that.   7:13 PM Pt rechecked. Sleeping. No seizure activity.   7:38 PM Pt stable. She has received Mg bolus. Starting drip. Awaiting labs and imaging.   11:29 PM patient monitored for several hours.  No further seizure activity.  She has been awaiting MRI/MRV, EEG.  Dr. Lockie Molauratolo has spoken with neurology in regards to the patient and they will consult.  Patient will require admission to the hospital for continued magnesium and monitoring.  Currently awaiting completion of evaluation to determine if patient is appropriate for OB/GYN admission.  12:02 AM Neuro OK with OB/GYN admit. OB/GYN to place orders.   CRITICAL CARE Performed by: Renne CriglerJoshua Jacquis Paxton PA-C Total critical care time: 45 minutes Critical care time was exclusive of separately  billable procedures and treating other patients. Critical care was necessary to treat or prevent imminent or life-threatening deterioration. Critical care was time spent personally by me on the  following activities: development of treatment plan with patient and/or surrogate as well as nursing, discussions with consultants, evaluation of patient's response to treatment, examination of patient, obtaining history from patient or surrogate, ordering and performing treatments and interventions, ordering and review of laboratory studies, ordering and review of radiographic studies, pulse oximetry and re-evaluation of patient's condition.     MDM Rules/Calculators/A&P                          Pending completion of work-up.   Final Clinical Impression(s) / ED Diagnoses Final diagnoses:  Eclampsia  Seizure [facility])    Rx / DC Orders ED Discharge Orders    None        Renne Crigler, PA-C 09/29/20 0002    Virgina Norfolk, DO 09/29/20 1541

## 2020-09-28 NOTE — Telephone Encounter (Signed)
Transition Care Management Follow-up Telephone Call  Date of discharge and from where: 09/27/2020 - [facility]  How have you been since you were released from the hospital? "Fine"  Any questions or concerns? No  Items Reviewed:  Did the pt receive and understand the discharge instructions provided? Yes   Medications obtained and verified? Yes   Other? No   Any new allergies since your discharge? No   Dietary orders reviewed? No  Do you have support at home? Yes    Functional Questionnaire: (I = Independent and D = Dependent) ADLs: I  Bathing/Dressing- I  Meal Prep- I  Eating- I  Maintaining continence- I  Transferring/Ambulation- I  Managing Meds- I  Follow up appointments reviewed:   PCP Hospital f/u appt confirmed? No    Specialist Hospital f/u appt confirmed? No    Are transportation arrangements needed? No   If their condition worsens, is the pt aware to call PCP or go to the Emergency Dept.? Yes  Was the patient provided with contact information for the PCP's office or ED? Yes  Was to pt encouraged to call back with questions or concerns? Yes

## 2020-09-29 ENCOUNTER — Inpatient Hospital Stay (HOSPITAL_COMMUNITY): Payer: Medicaid Other

## 2020-09-29 ENCOUNTER — Encounter (HOSPITAL_COMMUNITY): Payer: Self-pay | Admitting: Obstetrics & Gynecology

## 2020-09-29 DIAGNOSIS — O152 Eclampsia in the puerperium: Secondary | ICD-10-CM | POA: Diagnosis not present

## 2020-09-29 DIAGNOSIS — R000 Tachycardia, unspecified: Secondary | ICD-10-CM | POA: Diagnosis not present

## 2020-09-29 DIAGNOSIS — O159 Eclampsia, unspecified as to time period: Secondary | ICD-10-CM | POA: Diagnosis present

## 2020-09-29 DIAGNOSIS — O99893 Other specified diseases and conditions complicating puerperium: Secondary | ICD-10-CM | POA: Diagnosis not present

## 2020-09-29 DIAGNOSIS — Z20822 Contact with and (suspected) exposure to covid-19: Secondary | ICD-10-CM | POA: Diagnosis not present

## 2020-09-29 DIAGNOSIS — R569 Unspecified convulsions: Secondary | ICD-10-CM | POA: Diagnosis not present

## 2020-09-29 DIAGNOSIS — G40909 Epilepsy, unspecified, not intractable, without status epilepticus: Secondary | ICD-10-CM | POA: Diagnosis not present

## 2020-09-29 LAB — CBC
HCT: 22.7 % — ABNORMAL LOW (ref 36.0–46.0)
Hemoglobin: 7.7 g/dL — ABNORMAL LOW (ref 12.0–15.0)
MCH: 32.4 pg (ref 26.0–34.0)
MCHC: 33.9 g/dL (ref 30.0–36.0)
MCV: 95.4 fL (ref 80.0–100.0)
Platelets: 200 10*3/uL (ref 150–400)
RBC: 2.38 MIL/uL — ABNORMAL LOW (ref 3.87–5.11)
RDW: 14.8 % (ref 11.5–15.5)
WBC: 8.2 10*3/uL (ref 4.0–10.5)
nRBC: 0 % (ref 0.0–0.2)

## 2020-09-29 LAB — PROTEIN / CREATININE RATIO, URINE
Creatinine, Urine: 39.94 mg/dL
Total Protein, Urine: <6 mg/dL

## 2020-09-29 LAB — COMPREHENSIVE METABOLIC PANEL
ALT: 29 U/L (ref 0–44)
AST: 36 U/L (ref 15–41)
Albumin: 2.2 g/dL — ABNORMAL LOW (ref 3.5–5.0)
Alkaline Phosphatase: 80 U/L (ref 38–126)
Anion gap: 4 — ABNORMAL LOW (ref 5–15)
BUN: <5 mg/dL — ABNORMAL LOW (ref 6–20)
CO2: 25 mmol/L (ref 22–32)
Calcium: 7 mg/dL — ABNORMAL LOW (ref 8.9–10.3)
Chloride: 107 mmol/L (ref 98–111)
Creatinine, Ser: 0.56 mg/dL (ref 0.44–1.00)
GFR, Estimated: >60 mL/min (ref >60)
Glucose, Bld: 87 mg/dL (ref 70–99)
Potassium: 3.5 mmol/L (ref 3.5–5.1)
Sodium: 136 mmol/L (ref 135–145)
Total Bilirubin: 0.2 mg/dL — ABNORMAL LOW (ref 0.3–1.2)
Total Protein: 5.4 g/dL — ABNORMAL LOW (ref 6.5–8.1)

## 2020-09-29 LAB — URINALYSIS, ROUTINE W REFLEX MICROSCOPIC
Bilirubin Urine: NEGATIVE
Glucose, UA: NEGATIVE mg/dL
Ketones, ur: 5 mg/dL — AB
Nitrite: NEGATIVE
Protein, ur: NEGATIVE mg/dL
Specific Gravity, Urine: 1.009 (ref 1.005–1.030)
pH: 6 (ref 5.0–8.0)

## 2020-09-29 LAB — SARS CORONAVIRUS 2 (TAT 6-24 HRS): SARS Coronavirus 2: NEGATIVE

## 2020-09-29 LAB — MAGNESIUM: Magnesium: 5 mg/dL — ABNORMAL HIGH (ref 1.7–2.4)

## 2020-09-29 MED ORDER — BUTALBITAL-APAP-CAFFEINE 50-325-40 MG PO TABS
1.0000 | ORAL_TABLET | ORAL | Status: DC | PRN
  Administered 2020-09-29 (×2): 1 via ORAL
  Filled 2020-09-29 (×2): qty 1

## 2020-09-29 MED ORDER — LABETALOL HCL 5 MG/ML IV SOLN: 20.0000 mg | INTRAVENOUS | Status: DC | PRN

## 2020-09-29 MED ORDER — MAGNESIUM SULFATE 40 GM/1000ML IV SOLN
2.0000 g/h | INTRAVENOUS | Status: AC
  Administered 2020-09-29 (×2): 2 g/h via INTRAVENOUS
  Filled 2020-09-29 (×2): qty 1000

## 2020-09-29 MED ORDER — BUTALBITAL-APAP-CAFFEINE 50-325-40 MG PO TABS: 2.0000 | ORAL_TABLET | Freq: Four times a day (QID) | ORAL | Status: DC | PRN

## 2020-09-29 MED ORDER — HYDRALAZINE HCL 20 MG/ML IJ SOLN: 10.0000 mg | INTRAMUSCULAR | Status: DC | PRN

## 2020-09-29 MED ORDER — LACTATED RINGERS IV SOLN: INTRAVENOUS | Status: DC

## 2020-09-29 MED ORDER — ACETAMINOPHEN 325 MG PO TABS
650.0000 mg | ORAL_TABLET | Freq: Four times a day (QID) | ORAL | Status: DC | PRN
  Administered 2020-09-29 (×2): 650 mg via ORAL
  Filled 2020-09-29 (×2): qty 2

## 2020-09-29 MED ORDER — HYDRALAZINE HCL 20 MG/ML IJ SOLN: 5.0000 mg | INTRAMUSCULAR | Status: DC | PRN

## 2020-09-29 MED ORDER — LABETALOL HCL 5 MG/ML IV SOLN: 40.0000 mg | INTRAVENOUS | Status: DC | PRN

## 2020-09-29 MED ORDER — MAGNESIUM SULFATE 40 GM/1000ML IV SOLN: 2.0000 g/h | INTRAVENOUS | Status: DC

## 2020-09-29 MED ORDER — ALBUTEROL SULFATE HFA 108 (90 BASE) MCG/ACT IN AERS: 2.0000 | INHALATION_SPRAY | RESPIRATORY_TRACT | Status: DC | PRN

## 2020-09-29 MED ORDER — IBUPROFEN 600 MG PO TABS
600.0000 mg | ORAL_TABLET | Freq: Four times a day (QID) | ORAL | Status: DC | PRN
  Administered 2020-09-29: 600 mg via ORAL
  Filled 2020-09-29: qty 1

## 2020-09-29 NOTE — Progress Notes (Signed)
Preliminary review of the EEG- negative for seizure activity. Formal read will be available in the chart in the AM. Recommendations as initial consultation Please call neurology if we can be of further assistance  -- Milon Dikes, MD Neurologist [facility] Pager: [phone]

## 2020-09-29 NOTE — Procedures (Signed)
Patient Name: JAQUIA BENEDICTO  MRN: [id number]  Epilepsy Attending: Charlsie Quest  Referring Physician/Provider: Dr Janey Greaser Date: 09/29/2020 Duration: 23.39 mins  Patient history: 30 year old man with recent postpartum hemorrhage with estimated blood loss of 1800 cc requiring transfusions and TXA presenting for episode of unresponsiveness and whole body shaking while she was on the toilet.  EEG to evaluate for seizures.  Level of alertness: Awake, asleep  AEDs during EEG study: None  Technical aspects: This EEG study was done with scalp electrodes positioned according to the 10-20 International system of electrode placement. Electrical activity was acquired at a sampling rate of 500Hz  and reviewed with a high frequency filter of 70Hz  and a low frequency filter of 1Hz . EEG data were recorded continuously and digitally stored.   Description: The posterior dominant rhythm consists of 9 Hz activity of moderate voltage (25-35 uV) seen predominantly in posterior head regions, symmetric and reactive to eye opening and eye closing. Sleep was characterized by vertex waves, sleep spindles (12 to 14 Hz), maximal frontocentral region. Physiologic photic driving was not seen during photic stimulation. Hyperventilation was not performed.     IMPRESSION: This study is within normal limits. No seizures or epileptiform discharges were seen throughout the recording.  Carnell Casamento 

## 2020-09-29 NOTE — Progress Notes (Signed)
Patient ID: Joy Hartman, female   DOB: August 13, 1990, 30 y.o.   MRN: [id number]  HD#1 Readmission for Eclampsia  S: Pt with headache and blurred vision but otherwise feeling well. Prior to admission the patient states she was feeling well except that she felt constipated. Her last recollection prior to admission was going to the bathroom to have a bowel movement. She has no further memory of what happened. Prior to admission, she denies HA/vision changes O:  Vitals:   09/29/20 1602 09/29/20 1655 09/29/20 1850 09/29/20 1937  BP: 130/67   123/89  Pulse: 86   89  Resp: 16 18 18    Temp: 98.4 F (36.9 C)   98.8 F (37.1 C)  TempSrc: Oral   Oral  SpO2: 99%   100%  Weight:      Height:       AoX3, NAD Abd soft SCDs on LE  Results for orders placed or performed during the hospital encounter of 09/28/20 (from the past 24 hour(s))  Comprehensive metabolic panel     Status: Abnormal   Collection Time: 09/29/20  7:33 AM  Result Value Ref Range   Sodium 136 135 - 145 mmol/L   Potassium 3.5 3.5 - 5.1 mmol/L   Chloride 107 98 - 111 mmol/L   CO2 25 22 - 32 mmol/L   Glucose, Bld 87 70 - 99 mg/dL   BUN <5 (L) 6 - 20 mg/dL   Creatinine, Ser 11/29/20 0.44 - 1.00 mg/dL   Calcium 7.0 (L) 8.9 - 10.3 mg/dL   Total Protein 5.4 (L) 6.5 - 8.1 g/dL   Albumin 2.2 (L) 3.5 - 5.0 g/dL   AST 36 15 - 41 U/L   ALT 29 0 - 44 U/L   Alkaline Phosphatase 80 38 - 126 U/L   Total Bilirubin 0.2 (L) 0.3 - 1.2 mg/dL   GFR, Estimated 3.50 >09 mL/min   Anion gap 4 (L) 5 - 15  CBC     Status: Abnormal   Collection Time: 09/29/20  7:33 AM  Result Value Ref Range   WBC 8.2 4.0 - 10.5 K/uL   RBC 2.38 (L) 3.87 - 5.11 MIL/uL   Hemoglobin 7.7 (L) 12.0 - 15.0 g/dL   HCT 11/29/20 (L) 18.2 - 99.3 %   MCV 95.4 80.0 - 100.0 fL   MCH 32.4 26.0 - 34.0 pg   MCHC 33.9 30.0 - 36.0 g/dL   RDW 71.6 96.7 - 89.3 %   Platelets 200 150 - 400 K/uL   nRBC 0.0 0.0 - 0.2 %  Magnesium     Status: Abnormal   Collection Time: 09/29/20  7:33 AM   Result Value Ref Range   Magnesium 5.0 (H) 1.7 - 2.4 mg/dL  Urinalysis, Routine w reflex microscopic Urine, Random     Status: Abnormal   Collection Time: 09/29/20  9:45 AM  Result Value Ref Range   Color, Urine STRAW (A) YELLOW   APPearance CLEAR CLEAR   Specific Gravity, Urine 1.009 1.005 - 1.030   pH 6.0 5.0 - 8.0   Glucose, UA NEGATIVE NEGATIVE mg/dL   Hgb urine dipstick MODERATE (A) NEGATIVE   Bilirubin Urine NEGATIVE NEGATIVE   Ketones, ur 5 (A) NEGATIVE mg/dL   Protein, ur NEGATIVE NEGATIVE mg/dL   Nitrite NEGATIVE NEGATIVE   Leukocytes,Ua SMALL (A) NEGATIVE   RBC / HPF 0-5 0 - 5 RBC/hpf   WBC, UA 6-10 0 - 5 WBC/hpf   Bacteria, UA RARE (A) NONE SEEN  Squamous Epithelial / LPF 6-10 0 - 5   Mucus PRESENT   Protein / creatinine ratio, urine     Status: None   Collection Time: 09/29/20  9:45 AM  Result Value Ref Range   Creatinine, Urine 39.94 mg/dL   Total Protein, Urine <6 mg/dL   Protein Creatinine Ratio        0.00 - 0.15 mg/mg[Cre]   A/P: 30 yo PPD# 4 s/p SVD, HD #1 readmitted with eclampsia 1) Pt currently on magnesium sulfate for a total of 24 hrs 2) Has not required antihypertensive medications

## 2020-09-29 NOTE — Progress Notes (Signed)
Stat  EEG complete - results pending.  

## 2020-09-29 NOTE — Progress Notes (Signed)
OBGYN Note S: Feeling better, was given a tylenol for a headache overnight, now resolved, denies VC/RUQ pain/SOB.  O: Vitals:   09/29/20 0250 09/29/20 0350 09/29/20 0527 09/29/20 0620  BP:  135/83 122/66   Pulse:  100 91   Resp:  20 18 18   Temp:   97.8 F (36.6 C)   TempSrc:   Oral   SpO2: 99%  99%   Weight:      Height:       General: NAD, sleeping initially Alert and oriented x3 Lower extremity and upper extremity 5/5 strength DTR+2 No clonus RRR, CTAB  Recent Labs    09/27/20 0443 09/27/20 1423 09/28/20 1901  WBC 8.1  --  11.6*  HGB 6.1* 7.9* 8.4*  HCT 18.0* 22.7* 24.3*  PLT 151  --  231  NA  --   --  137  Joy  --   --  3.6  CL  --   --  106  BUN  --   --  8  CREATININE  --   --  0.55  AST  --   --  35  ALT  --   --  26  BILITOT  --   --  0.5   A/P: Joy Hartman 29 y.o. Joy Hartman PPD#4, HD#2, readmitted with seizures at home and in EMS, consistent with eclampsia, now on magnesium and stable  1. Eclampsia: BP normotensive-mild range, headache now resolved, labs normal on admit, repeat ordered for this AM. Received 4g mag bolus, now on 2g/h, plan to continue for 24 hours. Advance diet to liquids.  2. Seizures: consistent with eclampsia, seen by Neurology in ED, negative head imaging, EEG did not show seizures since admission.  3. ABLA/PPH: s/p 1u pRBC prior to discharge from postpartum, hgb now 8.4. Continue to monitor 4. Postpartum care: tylenol/motrin prn for pain. Currently breastfeeding  Joy Hartman Joy Hartman 09/29/20 7:06 AM

## 2020-09-30 LAB — COMPREHENSIVE METABOLIC PANEL
ALT: 34 U/L (ref 0–44)
AST: 33 U/L (ref 15–41)
Albumin: 2.7 g/dL — ABNORMAL LOW (ref 3.5–5.0)
Alkaline Phosphatase: 87 U/L (ref 38–126)
Anion gap: 6 (ref 5–15)
BUN: <5 mg/dL — ABNORMAL LOW (ref 6–20)
CO2: 26 mmol/L (ref 22–32)
Calcium: 8.6 mg/dL — ABNORMAL LOW (ref 8.9–10.3)
Chloride: 107 mmol/L (ref 98–111)
Creatinine, Ser: 0.6 mg/dL (ref 0.44–1.00)
GFR, Estimated: >60 mL/min (ref >60)
Glucose, Bld: 84 mg/dL (ref 70–99)
Potassium: 4.4 mmol/L (ref 3.5–5.1)
Sodium: 139 mmol/L (ref 135–145)
Total Bilirubin: <0.1 mg/dL — ABNORMAL LOW (ref 0.3–1.2)
Total Protein: 6.2 g/dL — ABNORMAL LOW (ref 6.5–8.1)

## 2020-09-30 NOTE — Progress Notes (Signed)
HD#3 s/p readmission for eclamptic seizure, brought in by EMS Pt denies HA, vision changes or other complaints at this time.   On exam: NAD, appropriate affect and responses, no neurologic deficits on survey A/P: Eclamptic seizure: now s/p MagSO4 x 24hrs.  BPs have been normal throughout her stay, not requiring medical management.  EEG, MRI/MRV wnl.  Spoke with Neuro stating plan to send her home today, they have no further recommendations, but request that pt schedule outpt f/u with them.  Spoke with RN Serita Grit who agrees to facilitate scheduling of this appt prior to patient discharge. Hypocalcemia accompanying increased mg level yesterday morning, will check repeat today to verify normalized prior to d/c.   Short interval f/u in office tomorrow, or Monday.

## 2020-09-30 NOTE — Discharge Summary (Signed)
Patient presented by EMS after eclamptic seizure. See H&P for details.  She was started on MgSO4 and neurology consult placed.  EEG, MRI/MRV all found to be normal.  After 24 hrs, MgSO4 was discontinued.  Patient's BPs remained normal through her stay, no BP meds were administered. Other labs normal except transiently low Calcium d/t MgSO4.  Pt reported feeling much better by HD#3 and was discharged home with neurology appt set for the following Monday and BP check at [facility] planned for 5/6 or 5/9.

## 2020-10-01 ENCOUNTER — Telehealth: Payer: Self-pay | Admitting: *Deleted

## 2020-10-01 NOTE — Telephone Encounter (Signed)
Transition Care Management Unsuccessful Follow-up Telephone Call  Date of discharge and from where:  09/30/2020 - [facility]  Attempts:  1st Attempt  Reason for unsuccessful TCM follow-up call:  Left voice message

## 2020-10-04 ENCOUNTER — Institutional Professional Consult (permissible substitution): Payer: Medicaid Other | Admitting: Neurology

## 2020-10-04 ENCOUNTER — Telehealth: Payer: Self-pay

## 2020-10-04 ENCOUNTER — Encounter: Payer: Self-pay | Admitting: Neurology

## 2020-10-04 NOTE — Telephone Encounter (Signed)
Transition Care Management Unsuccessful Follow-up Telephone Call  Date of discharge and from where:  09/30/2020 - [facility]  Attempts:  2nd Attempt  Reason for unsuccessful TCM follow-up call:  Left voice message

## 2020-10-04 NOTE — Telephone Encounter (Signed)
Pt no showed 10/04/20 appointment with Dr. Frances Furbish.

## 2020-10-05 ENCOUNTER — Encounter: Payer: Self-pay | Admitting: Neurology

## 2020-10-05 ENCOUNTER — Ambulatory Visit: Payer: Medicaid Other | Admitting: Neurology

## 2020-10-05 VITALS — BP 128/81 | HR 84 | Ht 66.0 in | Wt 185.0 lb

## 2020-10-05 DIAGNOSIS — O159 Eclampsia, unspecified as to time period: Secondary | ICD-10-CM

## 2020-10-05 DIAGNOSIS — R569 Unspecified convulsions: Secondary | ICD-10-CM | POA: Diagnosis not present

## 2020-10-05 NOTE — Telephone Encounter (Signed)
Transition Care Management Unsuccessful Follow-up Telephone Call  Date of discharge and from where:  09/30/2020 - [facility]  Attempts:  3rd Attempt  Reason for unsuccessful TCM follow-up call:  Left voice message

## 2020-10-05 NOTE — Patient Instructions (Signed)
Congratulations on your baby boy!  It was good to see you again.  I am glad to hear that you have not had any more seizures since 09/28/2020.  As per [facility], you are not to drive until you are seizure-free for 6 months which would be on 03/31/2021.  Please remember, common seizure triggers are: Sleep deprivation, dehydration, overheating, stress, hypoglycemia or skipping meals, certain medications or excessive alcohol use, especially stopping alcohol abruptly if you have had heavy alcohol use before (aka alcohol withdrawal seizure). If you have a prolonged seizure over 2-5 minutes or back to back seizures, call or have someone call 911 or take you to the nearest emergency room. You cannot drive a car or operate any other machinery or vehicle within 6 months of a seizure. Please do not swim alone or take a tub bath for safety. Do not climb on ladders or be at heights alone. Do not cook with large quantities of boiling water or oil for safety. Please ensure the water temperature at home is not too high. When carrying or caring for small children and infants, make sure you sit down when holding the child are feeding the child or changing them to minimize risk for injury to the child are to you if you were to have a seizure.  Take your medicine for seizure prevention regularly and do not skip doses or stop medication abruptly and tone are told to do so by your healthcare provider. Try to get a refill on your antiepileptic medication ahead of time, so you are not at risk of running out. If you run out of the seizure medication and do not have a refill at hand she may run into medication withdrawal seizures. Avoid taking Wellbutrin, narcotic pain medications and tramadol, as they can lower seizure threshold.  As per [facility] statutes, patients with seizures and epilepsy are not allowed to drive until they have been seizure-free for at least 6 months. This also applies to driving or using heavy  equipment or power tools.  I do not believe we have to treat you with seizure medication since you have never had any seizures before and this was a provoked situation from having recently had childbirth.  We will do an EEG (brainwave test), which we will schedule. We will call you with the results.  Please stay well-hydrated with water, try to rest enough, drink about 8 cups of water per day, 8 ounce size each, try to get about 7 or better 8 hours of sleep.  If your baby is not a good sleeper at night, try to take naps when you can.  Please follow-up routinely to see one of our nurse practitioners in 3 months, sooner if needed.

## 2020-10-05 NOTE — Progress Notes (Signed)
Subjective:    Patient ID: Joy Hartman is a 30 y.o. female.  HPI     Interim history:   Joy Hartman is a 30 year old right-handed woman with an underlying medical history of asthma, history of Covid, and recent admission for vaginal bleeding during third trimester pregnancy, who Presents for a new problem visit for eclamptic seizure, status post recent child birth on 09/25/2020. The patient is unaccompanied today. She missed an appointment on 10/04/2020.   Today, 10/05/20: She reports feeling well, no more seizures since 09/28/2020.  She has been trying to hydrate well and rest well, she is breast-feeding with success.  She has no prior history of epilepsy or seizures.  She was still anemic after getting transfusions.  She had a recent checkup for blood pressure with her OB GYN.  Last hemoglobin on 09/28/2020 was 8.4.  She has a follow-up appointment with OB/GYN on 10/27/2020.   She presented to the emergency room for seizure at home.  This was witnessed by family members and generalized shaking was reported.  EMS was called.  She had received blood transfusions in the context of severe anemia postpartum.  I reviewed the emergency room records.  She had 2 more episodes of brief shaking during the emergency room stay. She was admitted for observation overnight and treated symptomatically initially with Versed and then with magnesium.  She had a brain MRI with and without contrast on 09/28/2020 and I reviewed the results:  IMPRESSION: Normal brain MRI.   She had an MR venogram of the head without contrast on 09/28/2020 and I reviewed the results:  IMPRESSION: Normal intracranial MRV.      She had an EEG on 09/29/2020 and I reviewed the results:  IMPRESSION: This study is within normal limits. No seizures or epileptiform discharges were seen throughout the recording.  The patient's allergies, current medications, family history, past medical history, past social history, past surgical history and  problem list were reviewed and updated as appropriate.   Previously:   09/09/20: (She) reports a longstanding history of recurrent headaches, dating back to her childhood.  She has throbbing headaches, sometimes they are one-sided, sometimes are global.  Her headache has been worse since this pregnancy.  This is her second pregnancy, with the previous pregnancy she did have headaches but they seem to respond to Tylenol.  She reports nearly daily headache in the past few months.  It is not strictly one-sided, no neurological accompaniments such as one-sided weakness or numbness or tingling or droopy face or slurring of speech, no thunderclap headache, sometimes she has blurry vision and sometimes she sees spots.  She has prescription eyeglasses.  She does have a family history of headaches affecting her maternal aunt and maternal uncle.  Mom reports that maternal aunt had a brain aneurysm and maternal uncle also apparently had surgery for a brain aneurysm. She has tried over-the-counter Tylenol but it has not been as effective.  She does not currently take any Aleve or Advil.  In the past, she has tried Excedrin Migraine which was helpful.  She was tried on Fioricet per OB/GYN but is no longer on it.  For nausea, she has tried Zofran but is currently not on it.  She does have associated light sensitivity and nausea, no vomiting.  She tries to hydrate well, she is not sleeping as well, owing to third trimester pregnancy.  I reviewed your office note from 09/07/2020.  She is due in May.  She was advised to  start magnesium, she was encouraged to talk to her OB/GYN before starting it.  She has an 32-year-old son, she quit smoking, she currently does not drink any alcohol or daily caffeine.  Her Past Medical History Is Significant For: Past Medical History:  Diagnosis Date  . Asthma   . Chlamydia   . Gonorrhea   . Headache   . Panic attack   . Trichomonas     Her Past Surgical History Is Significant  For: History reviewed. No pertinent surgical history.  Her Family History Is Significant For: Family History  Problem Relation Age of Onset  . Asthma Mother   . Hypertension Mother   . Hypertension Father   . Hypertension Maternal Grandmother   . Hypertension Maternal Grandfather   . Hypertension Paternal Grandmother   . Hypertension Paternal Grandfather     Her Social History Is Significant For: Social History   Socioeconomic History  . Marital status: Single    Spouse name: Not on file  . Number of children: Not on file  . Years of education: Not on file  . Highest education level: Not on file  Occupational History  . Not on file  Tobacco Use  . Smoking status: Never Smoker  . Smokeless tobacco: Never Used  Vaping Use  . Vaping Use: Never used  Substance and Sexual Activity  . Alcohol use: No  . Drug use: Not Currently    Types: Marijuana  . Sexual activity: Yes  Other Topics Concern  . Not on file  Social History Narrative   Two children    Social Determinants of Health   Financial Resource Strain: Not on file  Food Insecurity: Not on file  Transportation Needs: Not on file  Physical Activity: Not on file  Stress: Not on file  Social Connections: Not on file    Her Allergies Are:  Allergies  Allergen Reactions  . Codeine Hives, Itching, Rash and Swelling  . Hydrocodone Itching and Swelling    Itching and swelling  :   Her Current Medications Are:  Outpatient Encounter Medications as of 10/05/2020  Medication Sig  . acetaminophen (TYLENOL) 500 MG tablet Take 1,000 mg by mouth every 6 (six) hours as needed.  . ferrous sulfate 325 (65 FE) MG tablet Take 1 tablet (325 mg total) by mouth 2 (two) times daily with a meal. (Patient not taking: Reported on 09/28/2020)  . ibuprofen (ADVIL) 600 MG tablet Take 1 tablet (600 mg total) by mouth every 6 (six) hours. (Patient not taking: Reported on 09/28/2020)  . [DISCONTINUED] fluticasone (FLONASE) 50 MCG/ACT nasal  spray Place 1 spray into both nostrils daily. (Patient not taking: Reported on 10/05/2020)  . [DISCONTINUED] Prenatal Vit-Fe Fumarate-FA (PRENATAL MULTIVITAMIN) TABS tablet Take 1 tablet by mouth daily at 12 noon.   No facility-administered encounter medications on file as of 10/05/2020.  :  Review of Systems:  Out of a complete 14 point review of systems, all are reviewed and negative with the exception of these symptoms as listed below: Review of Systems  Neurological:       Here for f/u on seizure like event on 09/28/20. Pt was dx with eclampsia delivered son on 09/25/20. Reports prior to this event no seizure activity. Reports since 09/28/20 she has been feeling well.     Objective:  Neurological Exam  Physical Exam Physical Examination:   Vitals:   10/05/20 0756  BP: 128/81  Pulse: 84    General Examination: The patient is a very pleasant  30 y.o. female in no acute distress. She appears well-developed and well-nourished and well groomed.   HEENT: Normocephalic, atraumatic, pupils are equal, round and reactive to light and accommodation. Extraocular tracking is good without limitation to gaze excursion or nystagmus noted. Normal smooth pursuit is noted. Hearing is grossly intact. Face is symmetric with normal facial animation and normal facial sensation. Speech is clear with no dysarthria noted. There is no hypophonia. There is no lip, neck/head, jaw or voice tremor. Neck is supple with full range of passive and active motion. There are no carotid bruits on auscultation. Oropharynx exam reveals: moderate mouth dryness. Tongue protrudes centrally and palate elevates symmetrically.   Chest: Clear to auscultation without wheezing, rhonchi or crackles noted.  Heart: S1+S2+0, regular and normal without murmurs, rubs or gallops noted.   Abdomen: Soft, non-tender and non-distended.  Extremities: There is no pitting edema in the distal lower extremities bilaterally. Pedal pulses are  intact.  Skin: Warm and dry without trophic changes noted.  Musculoskeletal: exam reveals no obvious joint deformities, tenderness or joint swelling or erythema.   Neurologically:  Mental status: The patient is awake, alert and oriented in all 4 spheres. Her immediate and remote memory, attention, language skills and fund of knowledge are appropriate. There is no evidence of aphasia, agnosia, apraxia or anomia. Speech is clear with normal prosody and enunciation. Thought process is linear. Mood is normal and affect is normal.  Cranial nerves II - XII are as described above under HEENT exam. In addition: shoulder shrug is normal with equal shoulder height noted. Motor exam: Normal bulk, strength and tone is noted. There is no drift, tremor or rebound. Reflexes are 2+ throughout. Babinski: Toes are flexor bilaterally. Fine motor skills and coordination: intact grossly.   Romberg negative.  Cerebellar testing: No dysmetria or intention tremor on finger to nose testing. Heel to shin is unremarkable bilaterally. There is no truncal or gait ataxia.  Sensory exam: intact to light touch in the upper and lower extremities.  Gait, station and balance: She stands easily. No veering to one side is noted. No leaning to one side is noted. Posture is age-appropriate and stance is narrow based. Gait shows normal stride length and normal pace. No problems turning are noted. Normal tandem gait.         Assessment and Plan:   In summary, Lanney GinsViveon D Laural BenesJohnson is a very pleasant 30 year old female with an underlying medical history of asthma, history of Covid, admission for vaginal bleeding during third trimester pregnancy, migraine headaches, anemia, and eclampsia with seizures on 09/28/2020 treated with magnesium, who presents for a new problem evaluation of her seizures.  She does not have a prior history of seizures, or epilepsy, feels at baseline, has not had any events since 09/28/2020, had work-up through the  hospital including imaging tests which were benign and EEG which was benign.  She has a history of anemia, last hemoglobin was still below 9.  She has a checkup appointment with her OB/GYN pending for 10/27/2020.  We talked about seizure precautions and triggers.  She is advised to stay well-hydrated, well rested, avoid dangerous situations such as being at heights, cooking with large pots of boiling water or oral, handling the child and holding the baby only when sitting down.  She is advised not to take a bath in the tub and not to swim, she is advised not to drive for 6 months until she continues to be seizure-free for 6 months which would  be around 03/31/2021.  She was given written instructions, I do not believe she needs to be on antiepileptic medication at this time, we will repeat an EEG and call her with the results.  She is advised to follow-up routinely to see one of our nurse practitioners in 3 months, sooner if needed.  I answered all her questions today and she was in agreement.   I spent 32 minutes in total face-to-face time and in reviewing records during pre-charting, more than 50% of which was spent in counseling and coordination of care, reviewing test results, reviewing medications and treatment regimen and/or in discussing or reviewing the diagnosis of seizures, the prognosis and treatment options. Pertinent laboratory and imaging test results that were available during this visit with the patient were reviewed by me and considered in my medical decision making (see chart for details).

## 2020-10-07 ENCOUNTER — Ambulatory Visit: Payer: Medicaid Other | Admitting: Nurse Practitioner

## 2020-10-18 ENCOUNTER — Other Ambulatory Visit: Payer: Medicaid Other

## 2020-10-19 ENCOUNTER — Encounter: Payer: Self-pay | Admitting: Neurology

## 2020-12-07 ENCOUNTER — Ambulatory Visit: Payer: Medicaid Other

## 2020-12-13 ENCOUNTER — Ambulatory Visit: Payer: Medicaid Other | Admitting: Neurology

## 2021-05-29 NOTE — L&D Delivery Note (Signed)
OB/GYN Faculty Practice Delivery Note  Joy Hartman is a 31 y.o. [id number] s/p VD at [redacted]w[redacted]d. She was admitted for IOL due to cHTN with SIPE/SF.   ROM: 2h 69m with clear fluid GBS Status: Negative/-- (06/16 1059) Maximum Maternal Temperature: 98.74F  Labor Progress: Initial SVE: 5/50/-2. She then progressed to complete with assistance of pit and AROM.   Delivery Date/Time: 2314  Delivery: Called to room urgently as fetal head was delivered with one push after patient feeling pressure. Head delivered R OA. No nuchal cord present. Shoulder and body delivered in usual fashion. Infant with spontaneous cry, placed on mother's abdomen, dried and stimulated. Cord clamped x 2 after 1-minute delay, and cut by patient's sister. Cord blood drawn. Placenta delivered spontaneously with gentle cord traction. Fundus firm with massage and Pitocin. Prophylactic TXA was given. Labia, perineum, vagina, and cervix inspected with a small oozing 1st degree perineal and left labial laceration. The perineal was repaired with a figure of eight stitch for hemostasis and the left labial was not repaired/hemostatic.   Baby Weight: pending  Placenta: 3 vessel, intact. Sent to L&D Complications: None Lacerations: 1st degree perineal, left labial  EBL: 350 mL Analgesia: local lidocaine for repair   Infant:  APGAR (1 MIN): 9   APGAR (5 MINS): 9    Leticia Penna, DO  OB Family Medicine Fellow, [facility], [facility] 11/30/2021, 11:52 PM

## 2021-08-02 IMAGING — MR MR [PERSON_NAME] HEAD
4 of 5 series · 20 of 48 positions shown · IV contrast (8.5 M GAD)
Comparison: None.

CLINICAL DATA: Seizure

EXAM:
MR VENOGRAM OF THE HEAD WITHOUT CONTRAST
TECHNIQUE: Angiographic images of the intracranial venous structures were
obtained using MRV technique without intravenous contrast.

[Series 10: MRV · coronal · 1.5mm · 0.43mm/px · 5 of 120 slices shown]
[im 1/120]
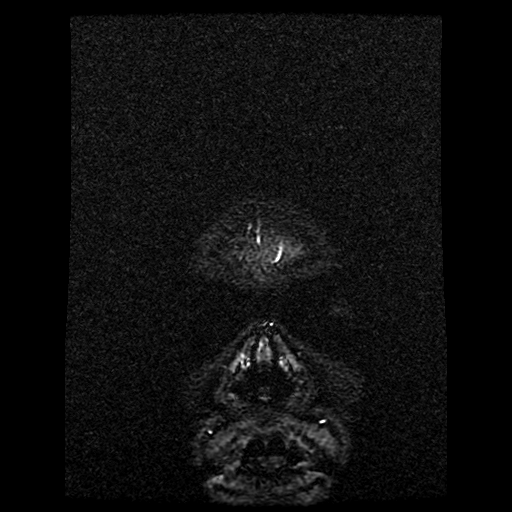
[im 30/120]
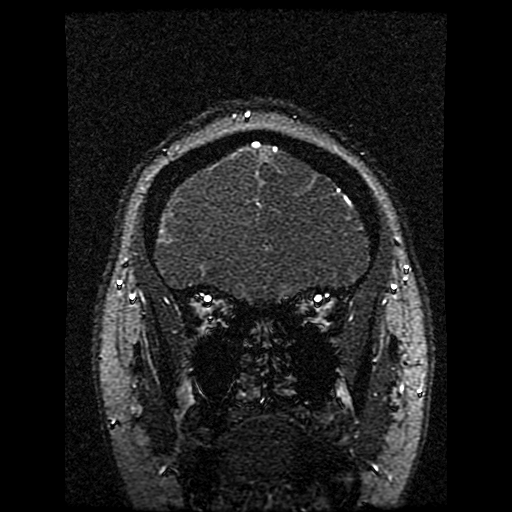
[im 60/120]
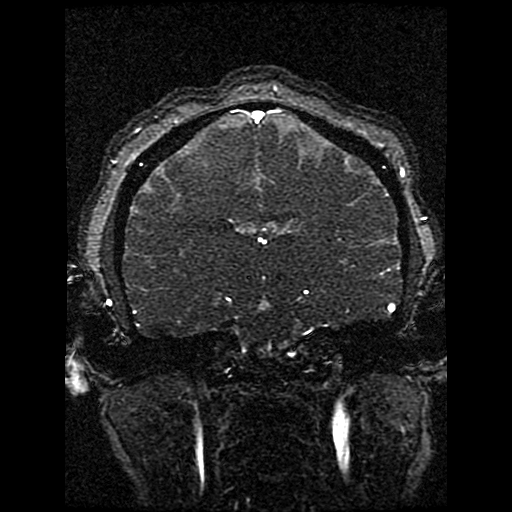
[im 90/120]
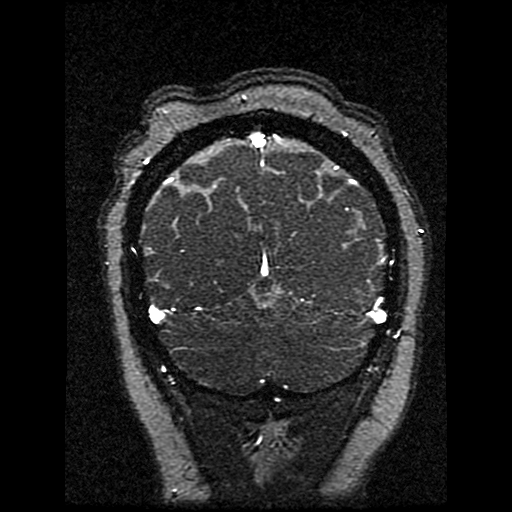
[im 120/120]
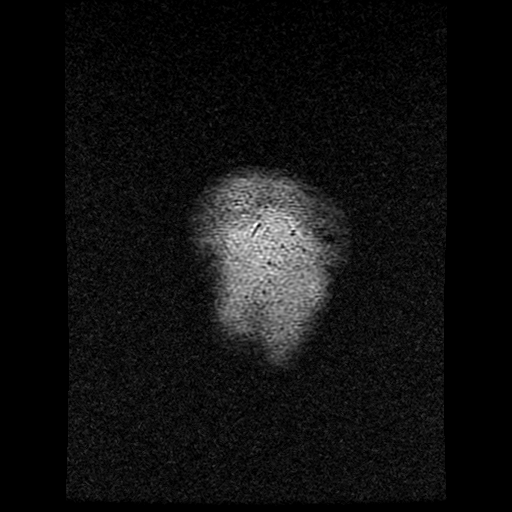

[Series 11: sag inhance (id) · sagittal · 1.8mm · 0.47mm/px · 9 of 330 slices shown]
[im 1/330]
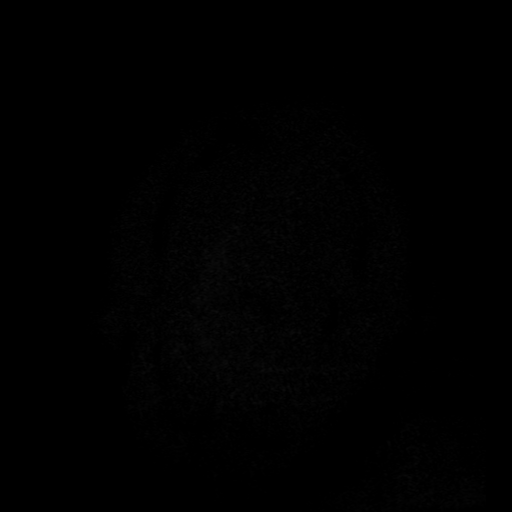
[im 48/330]
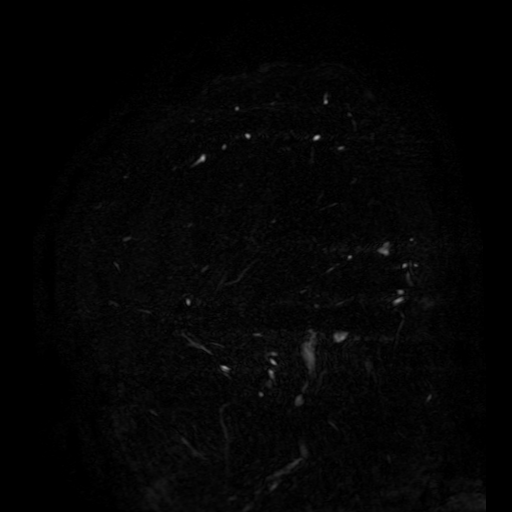
[im 95/330]
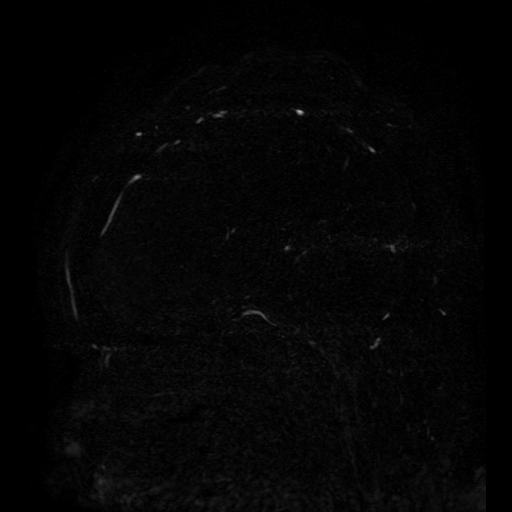
[im 142/330]
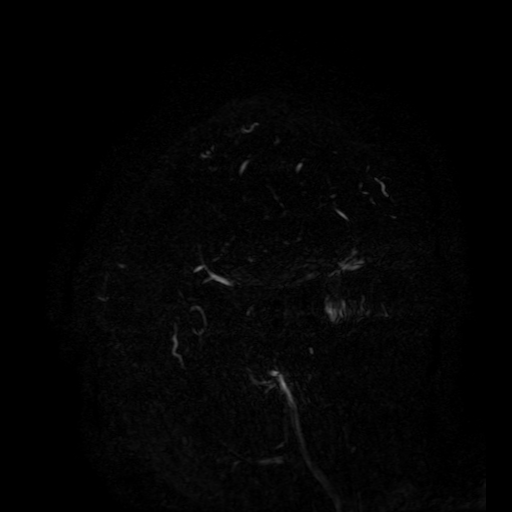
[im 165/330]
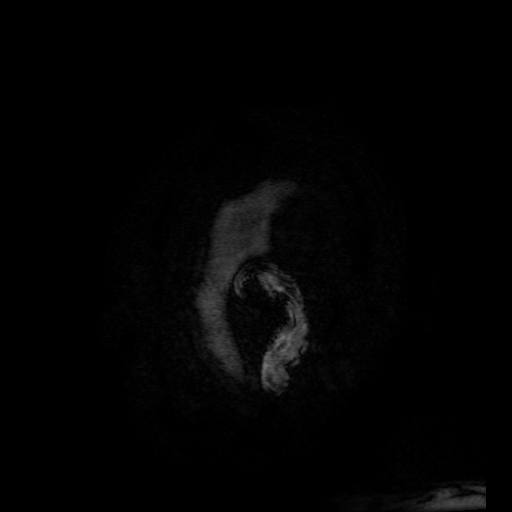
[im 189/330]
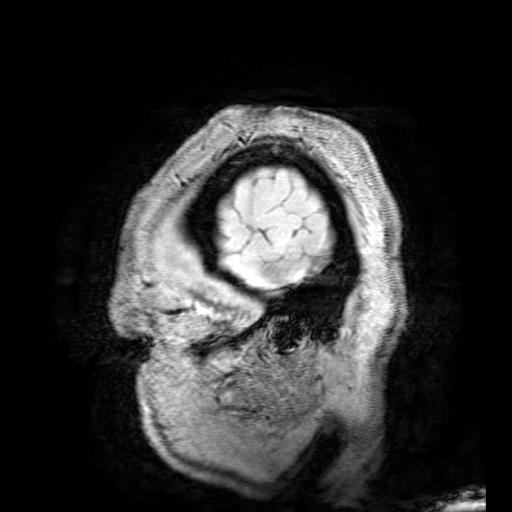
[im 236/330]
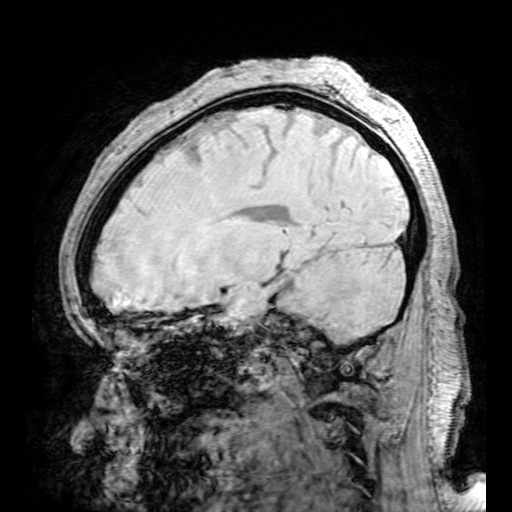
[im 283/330]
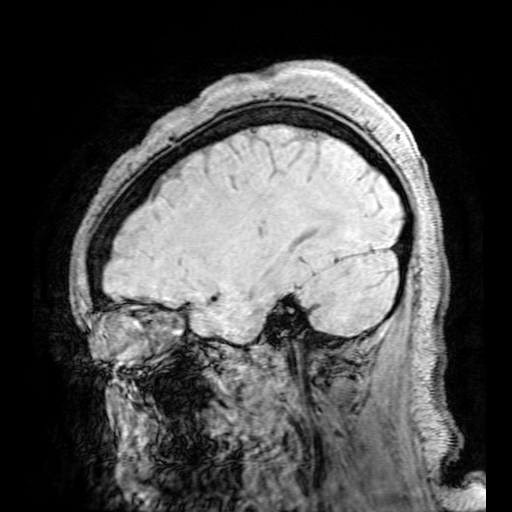
[im 330/330]
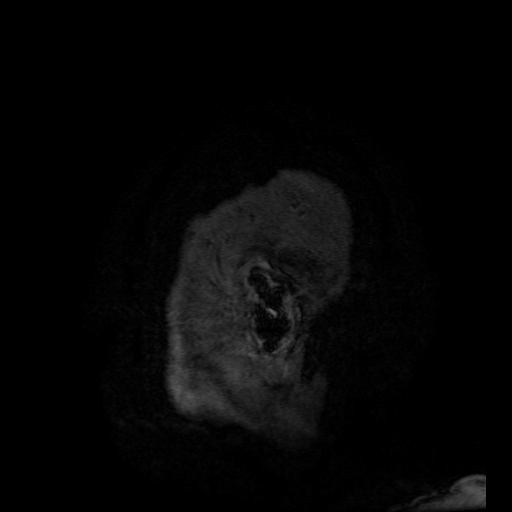

[Series 1200: multiplanar reconstruction (mpr) · sagittal · 0.9mm · 0.47mm/px · 3 of 196 slices shown (1 of 2)]
[im 25/196]
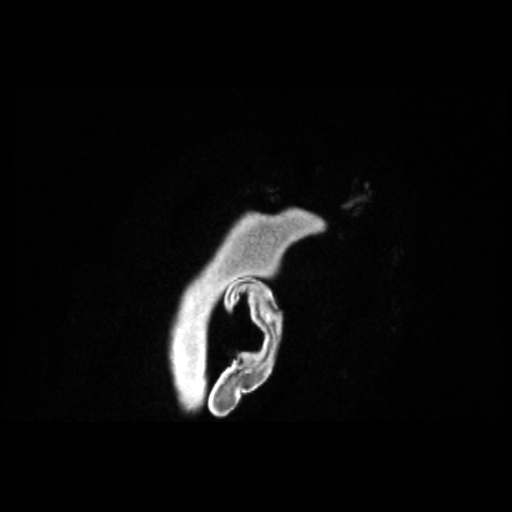
[im 98/196]
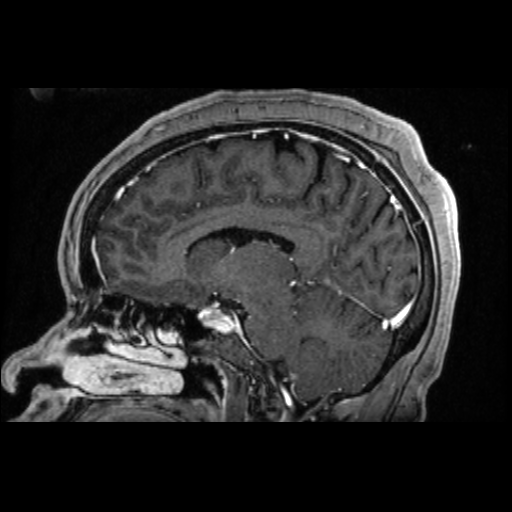
[im 171/196]
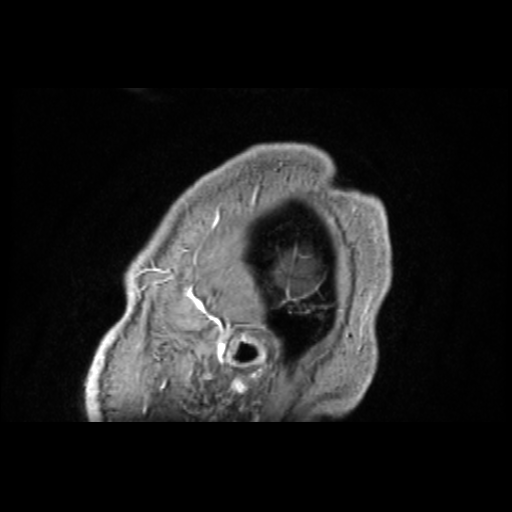

[Series 1201: multiplanar reconstruction (mpr) · coronal · 0.9mm · 0.47mm/px · 3 of 255 slices shown (2 of 2)]
[im 47/255]
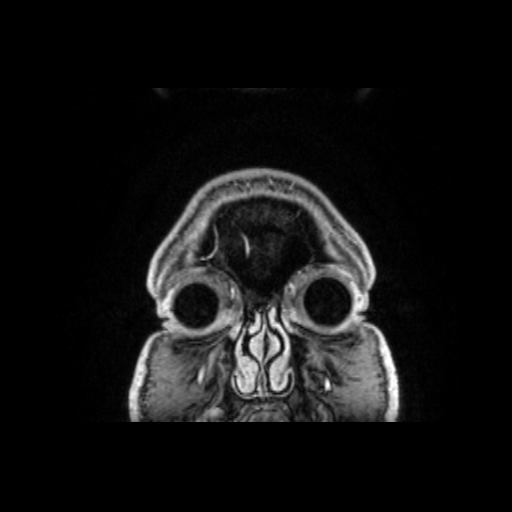
[im 139/255]
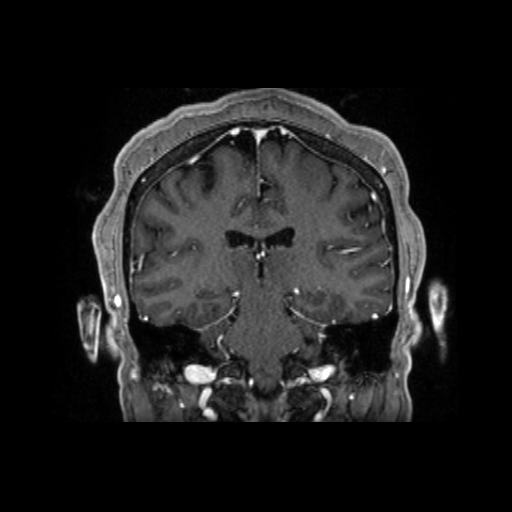
[im 231/255]
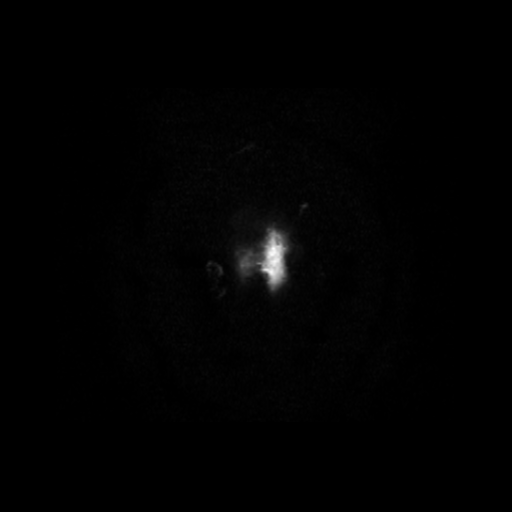

[20 of 48 positions shown; findings below may reference images not displayed]

FINDINGS: Superior sagittal sinus: Normal.

Straight sinus: Normal.

Inferior sagittal sinus, vein of Roksoliana and internal cerebral veins:
Normal.

Transverse sinuses: Normal.

Sigmoid sinuses: Normal.

Visualized jugular veins: Normal.
IMPRESSION: Normal intracranial MRV.

## 2021-08-30 ENCOUNTER — Encounter (HOSPITAL_COMMUNITY): Payer: Self-pay | Admitting: *Deleted

## 2021-08-30 ENCOUNTER — Inpatient Hospital Stay (HOSPITAL_COMMUNITY)
Admission: AD | Admit: 2021-08-30 | Discharge: 2021-08-30 | Disposition: A | Discharge status: Home / Self Care | Payer: Medicaid Other | Attending: Family Medicine | Admitting: Family Medicine

## 2021-08-30 DIAGNOSIS — O0932 Supervision of pregnancy with insufficient antenatal care, second trimester: Secondary | ICD-10-CM | POA: Diagnosis not present

## 2021-08-30 DIAGNOSIS — Z3A26 26 weeks gestation of pregnancy: Secondary | ICD-10-CM | POA: Diagnosis not present

## 2021-08-30 DIAGNOSIS — O26892 Other specified pregnancy related conditions, second trimester: Secondary | ICD-10-CM | POA: Diagnosis not present

## 2021-08-30 DIAGNOSIS — R030 Elevated blood-pressure reading, without diagnosis of hypertension: Secondary | ICD-10-CM | POA: Diagnosis not present

## 2021-08-30 DIAGNOSIS — O159 Eclampsia, unspecified as to time period: Secondary | ICD-10-CM | POA: Diagnosis not present

## 2021-08-30 DIAGNOSIS — Z3689 Encounter for other specified antenatal screening: Secondary | ICD-10-CM

## 2021-08-30 LAB — COMPREHENSIVE METABOLIC PANEL
ALT: 10 U/L (ref 0–44)
AST: 13 U/L — ABNORMAL LOW (ref 15–41)
Albumin: 2.9 g/dL — ABNORMAL LOW (ref 3.5–5.0)
Alkaline Phosphatase: 65 U/L (ref 38–126)
Anion gap: 8 (ref 5–15)
BUN: <5 mg/dL — ABNORMAL LOW (ref 6–20)
CO2: 22 mmol/L (ref 22–32)
Calcium: 8.7 mg/dL — ABNORMAL LOW (ref 8.9–10.3)
Chloride: 108 mmol/L (ref 98–111)
Creatinine, Ser: 0.51 mg/dL (ref 0.44–1.00)
GFR, Estimated: >60 mL/min (ref >60)
Glucose, Bld: 89 mg/dL (ref 70–99)
Potassium: 3.5 mmol/L (ref 3.5–5.1)
Sodium: 138 mmol/L (ref 135–145)
Total Bilirubin: 0.3 mg/dL (ref 0.3–1.2)
Total Protein: 6.8 g/dL (ref 6.5–8.1)

## 2021-08-30 LAB — HEPATITIS B SURFACE ANTIGEN: Hepatitis B Surface Ag: NONREACTIVE

## 2021-08-30 LAB — CBC
HCT: 29.2 % — ABNORMAL LOW (ref 36.0–46.0)
Hemoglobin: 10.6 g/dL — ABNORMAL LOW (ref 12.0–15.0)
MCH: 34.4 pg — ABNORMAL HIGH (ref 26.0–34.0)
MCHC: 36.3 g/dL — ABNORMAL HIGH (ref 30.0–36.0)
MCV: 94.8 fL (ref 80.0–100.0)
Platelets: 193 10*3/uL (ref 150–400)
RBC: 3.08 MIL/uL — ABNORMAL LOW (ref 3.87–5.11)
RDW: 12.5 % (ref 11.5–15.5)
WBC: 8.5 10*3/uL (ref 4.0–10.5)
nRBC: 0 % (ref 0.0–0.2)

## 2021-08-30 LAB — PROTEIN / CREATININE RATIO, URINE
Creatinine, Urine: 184.86 mg/dL
Protein Creatinine Ratio: 0.11 mg/mg{Cre} (ref 0.00–0.15)
Total Protein, Urine: 21 mg/dL

## 2021-08-30 LAB — ABO/RH: ABO/RH(D): O POS

## 2021-08-30 LAB — HEPATITIS C ANTIBODY: HCV Ab: NONREACTIVE

## 2021-08-30 LAB — HIV ANTIBODY (ROUTINE TESTING W REFLEX): HIV Screen 4th Generation wRfx: NONREACTIVE

## 2021-08-30 MED ORDER — FERROUS SULFATE 325 (65 FE) MG PO TABS: 325.0000 mg | ORAL_TABLET | ORAL | 3 refills | Status: DC

## 2021-08-30 MED ORDER — ASPIRIN EC 81 MG PO TBEC: 81.0000 mg | DELAYED_RELEASE_TABLET | Freq: Every day | ORAL | 2 refills | Status: DC

## 2021-08-30 MED ORDER — PREPLUS 27-1 MG PO TABS: 1.0000 | ORAL_TABLET | Freq: Every day | ORAL | 3 refills | Status: DC

## 2021-08-30 NOTE — MAU Provider Note (Signed)
?History  ?  ? ?CSN: [id number] ? ?Arrival date and time: 08/30/21 1139 ? ? Event Date/Time  ? First Provider Initiated Contact with Patient 08/30/21 1319   ?  ? ?Chief Complaint  ?Patient presents with  ? Possible Pregnancy  ? ?Joy Hartman is a 31 y.o. [id number] at [redacted]w[redacted]d by Definite LMP who has not initiated [facility].  She presents today for Possible Pregnancy.  She states she missed her menses in October and took a test in November and it was positive.  She states she repeated the test 2 weeks later and it was negative. She reports she was having bleeding and it was shorter than normal, but contributed it to regular menses with negative UPT. She reports she did not have another menses, but was breastfeeding.  She states baby self-weaned in January and has yet to have a cycle. She endorses fetal movement and denies abdominal cramping.  She also denies vaginal bleeding, discharge, or leaking.  No pain or difficulty with urination, constipation, or diarrhea.  ? ? ?OB History   ? ? Gravida  ?4  ? Para  ?2  ? Term  ?2  ? Preterm  ?0  ? AB  ?1  ? Living  ?2  ?  ? ? SAB  ?1  ? IAB  ?0  ? Ectopic  ?0  ? Multiple  ?0  ? Live Births  ?2  ?   ?  ?  ? ? ?Past Medical History:  ?Diagnosis Date  ? Asthma   ? Chlamydia   ? Gonorrhea   ? Headache   ? Panic attack   ? Trichomonas   ? ? ?History reviewed. No pertinent surgical history. ? ?Family History  ?Problem Relation Age of Onset  ? Asthma Mother   ? Hypertension Mother   ? Hypertension Father   ? Hypertension Maternal Grandmother   ? Hypertension Maternal Grandfather   ? Hypertension Paternal Grandmother   ? Hypertension Paternal Grandfather   ? ? ?Social History  ? ?Tobacco Use  ? Smoking status: Never  ? Smokeless tobacco: Never  ?Vaping Use  ? Vaping Use: Never used  ?Substance Use Topics  ? Alcohol use: No  ? Drug use: Not Currently  ?  Types: Marijuana  ?  Comment: last used 2 days ago  ? ? ?Allergies:  ?Allergies  ?Allergen Reactions  ? Codeine Hives, Itching, Rash and  Swelling  ? Hydrocodone Itching and Swelling  ?  Itching and swelling  ? ? ?Medications Prior to Admission  ?Medication Sig Dispense Refill Last Dose  ? acetaminophen (TYLENOL) 500 MG tablet Take 1,000 mg by mouth every 6 (six) hours as needed.     ? ferrous sulfate 325 (65 FE) MG tablet Take 1 tablet (325 mg total) by mouth 2 (two) times daily with a meal. (Patient not taking: Reported on 09/28/2020) 60 tablet 3   ? ibuprofen (ADVIL) 600 MG tablet Take 1 tablet (600 mg total) by mouth every 6 (six) hours. (Patient not taking: Reported on 09/28/2020) 30 tablet 0   ? ? ?Review of Systems  ?Constitutional:  Negative for chills and fever.  ?Respiratory:  Negative for cough and shortness of breath.   ?Gastrointestinal:  Negative for abdominal pain, nausea and vomiting.  ?Genitourinary:  Negative for difficulty urinating, dysuria, vaginal bleeding and vaginal discharge.  ?Musculoskeletal:  Positive for back pain.  ?Neurological:  Positive for dizziness, light-headedness and headaches (None currently).  ?Physical Exam  ? ?Blood pressure (!) 142/76,  pulse 96, resp. rate 18, last menstrual period 02/26/2021, unknown if currently breastfeeding. ? ?Vitals:  ? 08/30/21 1233 08/30/21 1305 08/30/21 1315 08/30/21 1330  ?BP: (!) 142/76 127/74 123/72 122/76  ?Pulse: 96 99 (!) 102 89  ?Resp: 18     ? ? ? ?Physical Exam ?Vitals reviewed.  ?Constitutional:   ?   Appearance: Normal appearance.  ?HENT:  ?   Head: Normocephalic and atraumatic.  ?Eyes:  ?   Conjunctiva/sclera: Conjunctivae normal.  ?Cardiovascular:  ?   Rate and Rhythm: Normal rate and regular rhythm.  ?Abdominal:  ?   General: Bowel sounds are normal.  ?   Palpations: Abdomen is soft.  ?   Tenderness: There is no abdominal tenderness.  ?   Comments: Gravid, Fundus at 24cm  ?Musculoskeletal:     ?   General: Normal range of motion.  ?   Cervical back: Normal range of motion.  ?   Right lower leg: No edema.  ?   Left lower leg: No edema.  ?Skin: ?   General: Skin is warm and  dry.  ?Neurological:  ?   Mental Status: She is alert and oriented to person, place, and time.  ?Psychiatric:     ?   Mood and Affect: Mood normal.     ?   Thought Content: Thought content normal.  ? ? ?Fetal Assessment ?150 bpm, Mod Var, -Decels, +Accels ?Toco: No ctx graphed ? ?MAU Course  ? ?Results for orders placed or performed during the hospital encounter of 08/30/21 (from the past 24 hour(s))  ?CBC     Status: Abnormal  ? Collection Time: 08/30/21 12:58 PM  ?Result Value Ref Range  ? WBC 8.5 4.0 - 10.5 K/uL  ? RBC 3.08 (L) 3.87 - 5.11 MIL/uL  ? Hemoglobin 10.6 (L) 12.0 - 15.0 g/dL  ? HCT 29.2 (L) 36.0 - 46.0 %  ? MCV 94.8 80.0 - 100.0 fL  ? MCH 34.4 (H) 26.0 - 34.0 pg  ? MCHC 36.3 (H) 30.0 - 36.0 g/dL  ? RDW 12.5 11.5 - 15.5 %  ? Platelets 193 150 - 400 K/uL  ? nRBC 0.0 0.0 - 0.2 %  ?ABO/Rh     Status: None  ? Collection Time: 08/30/21 12:58 PM  ?Result Value Ref Range  ? ABO/RH(D) O POS   ? No rh immune globuloin    ?  NOT A RH IMMUNE GLOBULIN CANDIDATE, PT RH POSITIVE ?Performed at [facility], [address] [place], [place] [id number] ?  ?HIV Antibody (routine testing w rflx)     Status: None  ? Collection Time: 08/30/21 12:58 PM  ?Result Value Ref Range  ? HIV Screen 4th Generation wRfx Non Reactive Non Reactive  ?Hepatitis B surface antigen     Status: None  ? Collection Time: 08/30/21 12:58 PM  ?Result Value Ref Range  ? Hepatitis B Surface Ag NON REACTIVE NON REACTIVE  ?Hepatitis C antibody     Status: None  ? Collection Time: 08/30/21 12:58 PM  ?Result Value Ref Range  ? HCV Ab NON REACTIVE NON REACTIVE  ?Comprehensive metabolic panel     Status: Abnormal  ? Collection Time: 08/30/21 12:58 PM  ?Result Value Ref Range  ? Sodium 138 135 - 145 mmol/L  ? Potassium 3.5 3.5 - 5.1 mmol/L  ? Chloride 108 98 - 111 mmol/L  ? CO2 22 22 - 32 mmol/L  ? Glucose, Bld 89 70 - 99 mg/dL  ? BUN <5 (  L) 6 - 20 mg/dL  ? Creatinine, Ser 0.51 0.44 - 1.00 mg/dL  ? Calcium 8.7 (L) 8.9 - 10.3 mg/dL  ? Total Protein 6.8  6.5 - 8.1 g/dL  ? Albumin 2.9 (L) 3.5 - 5.0 g/dL  ? AST 13 (L) 15 - 41 U/L  ? ALT 10 0 - 44 U/L  ? Alkaline Phosphatase 65 38 - 126 U/L  ? Total Bilirubin 0.3 0.3 - 1.2 mg/dL  ? GFR, Estimated >60 >60 mL/min  ? Anion gap 8 5 - 15  ?Protein / creatinine ratio, urine     Status: None  ? Collection Time: 08/30/21  1:06 PM  ?Result Value Ref Range  ? Creatinine, Urine 184.86 mg/dL  ? Total Protein, Urine 21 mg/dL  ? Protein Creatinine Ratio 0.11 0.00 - 0.15 mg/mg[Cre]  ? ?No results found. ? ?MDM ?PE ?Labs: Prenatal Labs ?EFM ?Prescriptions  ?Assessment and Plan  ?31 year old [id number]  ?SIUP at 26.3 weeks ?Cat I FT ?Elevated BP ?No Prenatal Care ? ?-Informed of positive UPT today and FHT heard. ?-Based on 1st positive UPT GA of 26.3 weeks! ?-Reviewed history. ?-Reports history of postpartum seizures.  ?-Review of chart finds ED visit with diagnosis of eclamptic seizures. Labs and BP noted normal.  ?-Discussed elevated blood pressure and evaluation.  Informed likely response from pregnancy discovery. ?-Exam performed and findings discussed. ?-Informed that fundal height c/w dates. ?-Will collect prenatal labs. ?-Message sent to [facility] to schedule for NOB appt ASAP.  ?-Order placed for Anatomy US. ?-Discussed and prescriptions sent for PNV, iron supplement, and bASA. ?-Precautions reviewed. ?-Informed that will discharge despite no labs returning as would not change POC today. ?-Patient without questions or concerns. ?-Congratulations given! ?-Encouraged to call primary office or return to MAU if symptoms worsen or with the onset of new symptoms. ?-Discharged to home in stable condition. ? ?Cherre Robins MSN, CNM ?08/30/2021, 1:19 PM  ? ?

## 2021-08-30 NOTE — MAU Note (Addendum)
.  Joy Hartman is a 31 y.o. at Unknown here in MAU reporting: she thinks she might be pregnant. Had one positive test in November and one negative test a few weeks later. LMP 02/26/2021. Had  baby 09/25/2020. Was on BCP but stopped in October.  After negative test she" put it out of her mind."  ?Was a little worried about her b/p because she has had some periods of seeing spots and having headache s off and on. ?LMP: 02/26/2021 ?Onset of complaint: n/a ?Pain score: 0 ?Vitals:  ? 08/30/21 1233  ?BP: (!) 142/76  ?Pulse: 96  ?Resp: 18  ?   ?FHT:165 ? ?Lab orders placed from triage:    ?

## 2021-08-31 LAB — GC/CHLAMYDIA PROBE AMP (~~LOC~~) NOT AT ARMC
Chlamydia: NEGATIVE
Comment: NEGATIVE
Comment: NORMAL
Neisseria Gonorrhea: NEGATIVE

## 2021-08-31 LAB — RPR: RPR Ser Ql: NONREACTIVE

## 2021-08-31 LAB — RUBELLA SCREEN: Rubella: 2.14 index (ref >0.99)

## 2021-08-31 LAB — HEMOGLOBIN A1C
Hgb A1c MFr Bld: 5.1 % (ref 4.8–5.6)
Mean Plasma Glucose: 100 mg/dL

## 2021-09-20 DIAGNOSIS — Z3480 Encounter for supervision of other normal pregnancy, unspecified trimester: Secondary | ICD-10-CM | POA: Insufficient documentation

## 2021-09-20 DIAGNOSIS — O0990 Supervision of high risk pregnancy, unspecified, unspecified trimester: Secondary | ICD-10-CM | POA: Insufficient documentation

## 2021-09-20 NOTE — Progress Notes (Signed)
?  ? ?Subjective:  ? ?Joy Hartman is a 31 y.o. [phone] at [redacted]w[redacted]d by LMP being seen today for her first obstetrical visit.  Her obstetrical history is significant for  eclampsia postpartum  and has Asthma; HEADACHE; Panic attacks; Elevated random blood glucose level; Bilateral hand swelling; Chlamydia; Chronic nonintractable headache; Eclampsia; and Supervision of other normal pregnancy, antepartum on their problem list.. Patient does intend to breast feed. Pregnancy history fully reviewed. ? ?Patient reports no complaints. ? ?HISTORY: ?OB History  ?Gravida Para Term Preterm AB Living  ?4 2 2  0 1 2  ?SAB IAB Ectopic Multiple Live Births  ?1 0 0 0 2  ?  ?# Outcome Date GA Lbr Len/2nd Weight Sex Delivery Anes PTL Lv  ?4 Current           ?3 Term 09/25/20 [redacted]w[redacted]d 02:40 / 00:02 7 lb 8.8 oz (3.425 kg) M Vag-Spont None  LIV  ?   Birth Comments: none  ?   Name: STEPHANIEANN, POPESCU  ?   Apgar1: 7  Apgar5: 9  ?2 SAB 09/19/18 [redacted]w[redacted]d    SAB     ?1 Term 03/13/12 [redacted]w[redacted]d 13:29 / 00:43 7 lb 11 oz (3.487 kg) M Vag-Spont EPI  LIV  ?   Birth Comments: WNL  ?   Name: AKILAH, CURETON  ?   Apgar1: 9  Apgar5: 9  ? ?Past Medical History:  ?Diagnosis Date  ? Asthma   ? Chlamydia   ? Gonorrhea   ? Headache   ? Panic attack   ? Trichomonas   ? ?No past surgical history on file. ?Family History  ?Problem Relation Age of Onset  ? Asthma Mother   ? Hypertension Mother   ? Hypertension Father   ? Hypertension Maternal Grandmother   ? Hypertension Maternal Grandfather   ? Hypertension Paternal Grandmother   ? Hypertension Paternal Grandfather   ? ?Social History  ? ?Tobacco Use  ? Smoking status: Never  ? Smokeless tobacco: Never  ?Vaping Use  ? Vaping Use: Never used  ?Substance Use Topics  ? Alcohol use: No  ? Drug use: Not Currently  ?  Types: Marijuana  ?  Comment: last used 2 days ago  ? ?Allergies  ?Allergen Reactions  ? Codeine Hives, Itching, Rash and Swelling  ? Hydrocodone Itching and Swelling  ?  Itching and swelling  ? ?Current  Outpatient Medications on File Prior to Visit  ?Medication Sig Dispense Refill  ? acetaminophen (TYLENOL) 500 MG tablet Take 1,000 mg by mouth every 6 (six) hours as needed.    ? aspirin EC 81 MG tablet Take 1 tablet (81 mg total) by mouth daily. 60 tablet 2  ? ferrous sulfate 325 (65 FE) MG tablet Take 1 tablet (325 mg total) by mouth every other day. 30 tablet 3  ? Prenatal Vit-Fe Fumarate-FA (PREPLUS) 27-1 MG TABS Take 1 tablet by mouth daily. 90 tablet 3  ? ?No current facility-administered medications on file prior to visit.  ? ? ? Indications for ASA therapy (per uptodate) ?One of the following: ?Previous pregnancy with preeclampsia, especially early onset and with an adverse outcome Yes ?Multifetal gestation No ?Chronic hypertension Yes ?Type 1 or 2 diabetes mellitus No ?Chronic kidney disease No ?Autoimmune disease (antiphospholipid syndrome, systemic lupus erythematosus) No ? ? Indications for early 1 hour GTT (per uptodate) ? ?Late to care, initial GTT at 29 weeks ? ? ? ?Exam  ? ?Vitals:  ? 09/21/21 0927  ?BP: 135/85  ?Pulse:  97  ?Weight: 186 lb (84.4 kg)  ? ?Fetal Heart Rate (bpm): 152 ? ?Uterus:     ?Pelvic Exam: Perineum: no hemorrhoids, normal perineum  ? Vulva: normal external genitalia, no lesions  ? Vagina:  normal mucosa, normal discharge  ? Cervix: no lesions and normal, pap smear done.   ? Adnexa: normal adnexa and no mass, fullness, tenderness  ? Bony Pelvis: average  ?System: General: well-developed, well-nourished female in no acute distress  ? Breast:  normal appearance, no masses or tenderness  ? Skin: normal coloration and turgor, no rashes  ? Neurologic: oriented, normal, negative, normal mood  ? Extremities: normal strength, tone, and muscle mass, ROM of all joints is normal  ? HEENT PERRLA, extraocular movement intact and sclera clear, anicteric  ? Mouth/Teeth mucous membranes moist, pharynx normal without lesions and dental hygiene good  ? Neck supple and no masses  ? Cardiovascular:  regular rate and rhythm  ? Respiratory:  no respiratory distress, normal breath sounds  ? Abdomen: soft, non-tender; bowel sounds normal; no masses,  no organomegaly  ? ?  ?Assessment:  ? ?Pregnancy: [id number] ?Patient Active Problem List  ? Diagnosis Date Noted  ? Supervision of other normal pregnancy, antepartum 09/20/2021  ? Eclampsia 09/29/2020  ? Chronic nonintractable headache 09/07/2020  ? Chlamydia 09/04/2018  ? Bilateral hand swelling 11/09/2014  ? Panic attacks 03/30/2014  ? Elevated random blood glucose level 03/30/2014  ? Asthma 07/13/2010  ? HEADACHE 07/13/2010  ? ?  ?Plan:  ?1. [redacted] weeks gestation of pregnancy ? ? ?2. History of eclampsia ?--Pt was 4 days PP, presented with seizure, followed up with [facility], no further management was recommended, no seizures since then ? ?3. Elevated blood pressure reading without diagnosis of hypertension ?--Hx eclampsia, but likely CHTN now with BPs at home of 140s-150s/90s before pregnancy, strong family hx of HTN, and initial BP in MAU 140s/90s at 26 weeks ?--No documented HTN since last pregnancy so no formal dx of HTN, late to care at 26 weeks with this pregnancy ? ?4. Supervision of high risk pregnancy in third trimester ? ?- Genetic Screening ?- Glucose Tolerance, 2 Hours w/1 Hour ?- Culture, OB Urine ?- CBC ?- RPR ?- Tdap vaccine greater than or equal to 7yo IM ? ?5. Cervical cancer screening ?--possible hx abnormal Pap, pt was supposed to follow up 1-2 years ago but did not ?- Cytology - PAP( [place])  ? ? ? ?Initial labs drawn. ?Continue prenatal vitamins. ?Discussed and offered genetic screening options, including Quad screen/AFP, NIPS testing, and option to decline testing. Benefits/risks/alternatives reviewed. Pt aware that anatomy US is form of genetic screening with lower accuracy in detecting trisomies than blood work.  Pt chooses genetic screening today. NIPS: ordered. ?Ultrasound discussed; fetal anatomic survey: ordered. ?Problem list  reviewed and updated. ?The nature of [facility] with multiple MDs and other Advanced Practice Providers was explained to patient; also emphasized that residents, students are part of our team. ?Routine obstetric precautions reviewed. ?Return in about 2 weeks (around 10/05/2021) for GTT at next visit, MD only, HROB. ? ? ?Sharen Counter, CNM ?09/21/21 ?1:25 PM  ?

## 2021-09-21 ENCOUNTER — Other Ambulatory Visit (HOSPITAL_COMMUNITY)
Admission: RE | Admit: 2021-09-21 | Discharge: 2021-09-21 | Disposition: A | Discharge status: Home / Self Care | Payer: Medicaid Other | Source: Ambulatory Visit | Attending: Advanced Practice Midwife | Admitting: Advanced Practice Midwife

## 2021-09-21 ENCOUNTER — Other Ambulatory Visit: Payer: Medicaid Other

## 2021-09-21 ENCOUNTER — Ambulatory Visit (INDEPENDENT_AMBULATORY_CARE_PROVIDER_SITE_OTHER): Payer: Medicaid Other | Admitting: Advanced Practice Midwife

## 2021-09-21 ENCOUNTER — Encounter: Payer: Self-pay | Admitting: Advanced Practice Midwife

## 2021-09-21 VITALS — BP 135/85 | HR 97 | Wt 186.0 lb

## 2021-09-21 DIAGNOSIS — Z8759 Personal history of other complications of pregnancy, childbirth and the puerperium: Secondary | ICD-10-CM

## 2021-09-21 DIAGNOSIS — O0993 Supervision of high risk pregnancy, unspecified, third trimester: Secondary | ICD-10-CM

## 2021-09-21 DIAGNOSIS — Z124 Encounter for screening for malignant neoplasm of cervix: Secondary | ICD-10-CM

## 2021-09-21 DIAGNOSIS — Z3A29 29 weeks gestation of pregnancy: Secondary | ICD-10-CM

## 2021-09-21 DIAGNOSIS — Z3480 Encounter for supervision of other normal pregnancy, unspecified trimester: Secondary | ICD-10-CM

## 2021-09-21 DIAGNOSIS — Z3483 Encounter for supervision of other normal pregnancy, third trimester: Secondary | ICD-10-CM | POA: Diagnosis not present

## 2021-09-21 DIAGNOSIS — Z3482 Encounter for supervision of other normal pregnancy, second trimester: Secondary | ICD-10-CM | POA: Diagnosis not present

## 2021-09-21 DIAGNOSIS — R030 Elevated blood-pressure reading, without diagnosis of hypertension: Secondary | ICD-10-CM

## 2021-09-21 DIAGNOSIS — Z87898 Personal history of other specified conditions: Secondary | ICD-10-CM

## 2021-09-21 NOTE — Progress Notes (Signed)
Very late to care NOB, Pt stated she did not know she was pregnant. ?

## 2021-09-22 LAB — GLUCOSE TOLERANCE, 2 HOURS W/ 1HR
Glucose, 1 hour: 117 mg/dL (ref 70–179)
Glucose, 2 hour: 86 mg/dL (ref 70–152)
Glucose, Fasting: 89 mg/dL (ref 70–91)

## 2021-09-22 LAB — CBC
Hematocrit: 29.4 % — ABNORMAL LOW (ref 34.0–46.6)
Hemoglobin: 10.4 g/dL — ABNORMAL LOW (ref 11.1–15.9)
MCH: 33.5 pg — ABNORMAL HIGH (ref 26.6–33.0)
MCHC: 35.4 g/dL (ref 31.5–35.7)
MCV: 95 fL (ref 79–97)
Platelets: 186 10*3/uL (ref 150–450)
RBC: 3.1 x10E6/uL — ABNORMAL LOW (ref 3.77–5.28)
RDW: 12.7 % (ref 11.7–15.4)
WBC: 7 10*3/uL (ref 3.4–10.8)

## 2021-09-22 LAB — RPR: RPR Ser Ql: NONREACTIVE

## 2021-09-23 LAB — URINE CULTURE, OB REFLEX

## 2021-09-23 LAB — CYTOLOGY - PAP: Diagnosis: HIGH — AB

## 2021-09-23 LAB — CULTURE, OB URINE

## 2021-09-26 ENCOUNTER — Encounter: Payer: Self-pay | Admitting: Obstetrics

## 2021-09-26 ENCOUNTER — Telehealth: Payer: Self-pay | Admitting: Advanced Practice Midwife

## 2021-09-26 NOTE — Telephone Encounter (Signed)
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 MyChart message sent and pt informed that colpo was recommended, either now or postpartum. I consulted Dr Debroah Loop, who is scheduled for pt next prenatal visit, who recommends colpo postpartum.  I called pt to inform her of plan to follow up with colpo postpartum and left message for her to return call to [facility].  ?

## 2021-09-28 ENCOUNTER — Encounter: Payer: Self-pay | Admitting: Advanced Practice Midwife

## 2021-10-05 ENCOUNTER — Ambulatory Visit (INDEPENDENT_AMBULATORY_CARE_PROVIDER_SITE_OTHER): Payer: Medicaid Other | Admitting: Obstetrics & Gynecology

## 2021-10-05 VITALS — BP 126/82 | HR 98 | Wt 190.0 lb

## 2021-10-05 DIAGNOSIS — Z23 Encounter for immunization: Secondary | ICD-10-CM | POA: Diagnosis not present

## 2021-10-05 DIAGNOSIS — O0993 Supervision of high risk pregnancy, unspecified, third trimester: Secondary | ICD-10-CM

## 2021-10-05 DIAGNOSIS — Z8759 Personal history of other complications of pregnancy, childbirth and the puerperium: Secondary | ICD-10-CM

## 2021-10-05 NOTE — Progress Notes (Signed)
ROB 31.4 wks  ?No concerns ?Was late to care, this is second visit ?28 wk labs last time ?TDAP today ?

## 2021-10-12 ENCOUNTER — Telehealth: Payer: Self-pay

## 2021-10-12 ENCOUNTER — Ambulatory Visit: Payer: Medicaid Other

## 2021-10-12 ENCOUNTER — Encounter: Payer: Self-pay | Admitting: Advanced Practice Midwife

## 2021-10-12 ENCOUNTER — Ambulatory Visit: Payer: Medicaid Other | Admitting: *Deleted

## 2021-10-12 ENCOUNTER — Other Ambulatory Visit: Payer: Self-pay | Admitting: *Deleted

## 2021-10-12 VITALS — BP 124/73 | HR 98

## 2021-10-12 DIAGNOSIS — O0933 Supervision of pregnancy with insufficient antenatal care, third trimester: Secondary | ICD-10-CM | POA: Insufficient documentation

## 2021-10-12 DIAGNOSIS — Z3A26 26 weeks gestation of pregnancy: Secondary | ICD-10-CM | POA: Diagnosis not present

## 2021-10-12 DIAGNOSIS — Z3A32 32 weeks gestation of pregnancy: Secondary | ICD-10-CM | POA: Diagnosis not present

## 2021-10-12 DIAGNOSIS — O10013 Pre-existing essential hypertension complicating pregnancy, third trimester: Secondary | ICD-10-CM | POA: Insufficient documentation

## 2021-10-12 DIAGNOSIS — O09293 Supervision of pregnancy with other poor reproductive or obstetric history, third trimester: Secondary | ICD-10-CM | POA: Insufficient documentation

## 2021-10-12 DIAGNOSIS — O159 Eclampsia, unspecified as to time period: Secondary | ICD-10-CM | POA: Insufficient documentation

## 2021-10-12 DIAGNOSIS — Z3480 Encounter for supervision of other normal pregnancy, unspecified trimester: Secondary | ICD-10-CM

## 2021-10-12 DIAGNOSIS — O0932 Supervision of pregnancy with insufficient antenatal care, second trimester: Secondary | ICD-10-CM | POA: Insufficient documentation

## 2021-10-12 DIAGNOSIS — O285 Abnormal chromosomal and genetic finding on antenatal screening of mother: Secondary | ICD-10-CM

## 2021-10-12 NOTE — Progress Notes (Signed)
Abnormal Horizon. Referral to MFM for genetic counseling. ?

## 2021-10-13 ENCOUNTER — Other Ambulatory Visit: Payer: Self-pay | Admitting: *Deleted

## 2021-10-13 DIAGNOSIS — O10913 Unspecified pre-existing hypertension complicating pregnancy, third trimester: Secondary | ICD-10-CM

## 2021-10-13 DIAGNOSIS — Z362 Encounter for other antenatal screening follow-up: Secondary | ICD-10-CM

## 2021-10-17 ENCOUNTER — Encounter: Payer: Self-pay | Admitting: Advanced Practice Midwife

## 2021-10-17 DIAGNOSIS — Z148 Genetic carrier of other disease: Secondary | ICD-10-CM | POA: Insufficient documentation

## 2021-10-17 HISTORY — DX: Genetic carrier of other disease: Z14.8

## 2021-10-19 ENCOUNTER — Encounter: Payer: Self-pay | Admitting: Obstetrics

## 2021-10-19 ENCOUNTER — Ambulatory Visit: Payer: Medicaid Other | Attending: Advanced Practice Midwife | Admitting: Genetics

## 2021-10-19 ENCOUNTER — Encounter: Payer: Self-pay | Admitting: Genetics

## 2021-10-19 ENCOUNTER — Ambulatory Visit (INDEPENDENT_AMBULATORY_CARE_PROVIDER_SITE_OTHER): Payer: Medicaid Other | Admitting: Obstetrics

## 2021-10-19 VITALS — BP 124/80 | HR 101 | Wt 192.0 lb

## 2021-10-19 DIAGNOSIS — Z8759 Personal history of other complications of pregnancy, childbirth and the puerperium: Secondary | ICD-10-CM

## 2021-10-19 DIAGNOSIS — Z148 Genetic carrier of other disease: Secondary | ICD-10-CM

## 2021-10-19 DIAGNOSIS — Z315 Encounter for genetic counseling: Secondary | ICD-10-CM

## 2021-10-19 DIAGNOSIS — M549 Dorsalgia, unspecified: Secondary | ICD-10-CM

## 2021-10-19 DIAGNOSIS — O10919 Unspecified pre-existing hypertension complicating pregnancy, unspecified trimester: Secondary | ICD-10-CM

## 2021-10-19 DIAGNOSIS — O0993 Supervision of high risk pregnancy, unspecified, third trimester: Secondary | ICD-10-CM

## 2021-10-19 MED ORDER — COMFORT FIT MATERNITY SUPP SM MISC: 0 refills | Status: DC

## 2021-10-19 NOTE — Progress Notes (Signed)
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   EDC: 12/03/2021 LMP: 02/26/2021 Referring Provider:  Hurshel Hartman  EGA: [redacted]w[redacted]d Genetic Counselor: Joy Dunk, MS, CGC  OB Hx: [id number] Date of Appointment: 10/19/2021  Accompanied by: Joy Hartman Time: 30 Minutes   Previous Testing Completed: Joy Hartman previously completed Non-Invasive Prenatal Screening (NIPS) in this pregnancy. The result is low risk, consistent with a female fetus. This screening significantly reduces the risk that the current pregnancy has Down syndrome, Trisomy 38, Trisomy 78, and common sex chromosome conditions, however, the risk is not zero given the limitations of NIPS. Additionally, there are many genetic conditions that cannot be detected by NIPS. Joy Hartman previously completed carrier screening. She screened to have an increased chance to be a silent carrier for Spinal Muscular Atrophy (SMA). She screened to not be a carrier for Cystic Fibrosis (CF), alpha thalassemia, and beta hemoglobinopathies. A negative result on carrier screening reduces the likelihood of being a carrier, however, does not entirely rule out the possibility.   Medical & Family History:  This is Joy Hartman's 4th pregnancy. She has two living, healthy children. She has had one loss. Reports she takes low dose Aspirin, prenatal vitamins, and iron. Personal history of high blood pressure towards the end of her last pregnancy.  Denies a personal history of diabetes, thyroid conditions, and seizures. Denies bleeding, infections, and fevers in this pregnancy. Denies using tobacco, alcohol, or street drugs in this pregnancy.  Joy Hartman reports her reproductive partner's son from a previous partner has a diagnosis of Autism. Maternal ethnicity reported as African American and paternal ethnicity reported as African American. Denies consanguinity.  Denies [place].  Denies a family history of intellectual disabilities, birth defects, blindness/deafness, individuals with more than three miscarriages, stillborn infants, and infants that have unexpectedly died young.      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 SMA is an autosomal recessive disease caused by loss of function mutations in the SMN1 gene. Joy Hartman screened to have two functional copies of the SMN1 gene, however, due to limitations of genetic screening the laboratory cannot confirm if Joy Hartman's two copies are on the same chromosome or on opposite chromosomes. The laboratory identified the variant [id number] in Joy Hartman's screening, meaning there is increased risk for Joy Hartman's two copies of the SMN1 gene to be on the same chromosome. Specifically, given Joy Hartman's ethnic background, her risk to be a silent carrier is approximately 1 in 62. Genetic counseling reviewed with Joy Hartman that if her two copies are on the same chromosome that means her other chromosome would have zero copies, and there could be risk for an affected pregnancy if her reproductive partner is found to be a carrier for SMA. Individuals with SMA have progressive, proximal, and symmetrical muscle weakness and atrophy due to degeneration of motor neurons of the spine and brainstem. Symptoms can first appear at any time between before birth to adulthood. There are five types of SMA that are historically classified based on clinical presentation, onset of symptoms, and the maximum skill level of motor milestones achieved, however, there is a lot of overlap between types. There are several treatments available for individuals affected with SMA and studies show that treatment is most effective when it is started in the first few months of life. Given Joy Hartman's carrier screening result, genetic counseling offered carrier screening for her reproductive partner  for SMA. Joy Hartman declined  carrier screening for SMA on her reproductive partner's behalf. We discussed that SMA is included in Joy Hartman's newborn screening program.  Family history of Autism Spectrum Disorder. Joy Hartman reports her reproductive partner's son from a previous partner has a diagnosis of Autism. Autism Spectrum Disorder affects approximately 1-2% of the general population in the [place], [place], and [place]. Autism is a neurological and developmental disorder that affects how people interact with others, communicate, learn, and behave. Autism is known as a "spectrum" disorder because there is wide variation in the type and severity of symptoms people experience. Genetic testing for individuals with a clinical diagnosis of Autism yields an explanation in only about 20% of cases, and the remaining 80% of cases are left with unknown etiology. We reviewed that we are unable to test directly for Autism in pregnancy.     Patient Plan:  Proceed with: Routine prenatal care Informed consent was obtained. All questions were answered.  Declined: Carrier Screening for SMA for Joy Hartman's reproductive partner.    Thank you for sharing in the care of Joy Hartman with [place].  Please do not hesitate to contact us if you have any questions.  Joy Dunk, MS, [facility]

## 2021-10-19 NOTE — Progress Notes (Signed)
Subjective:  Joy Hartman is a 31 y.o. [phone] at [redacted]w[redacted]d being seen today for ongoing prenatal care.  She is currently monitored for the following issues for this high-risk pregnancy and has Asthma; HEADACHE; Panic attacks; Chlamydia; Chronic nonintractable headache; Supervision of other normal pregnancy, antepartum; History of eclampsia; and Carrier of spinal muscular atrophy on their problem list.  Patient reports backache and heartburn.  Contractions: Irritability. Vag. Bleeding: None.  Movement: Present. Denies leaking of fluid.   The following portions of the patient's history were reviewed and updated as appropriate: allergies, current medications, past family history, past medical history, past social history, past surgical history and problem list. Problem list updated.  Objective:   Vitals:   10/19/21 1106  BP: 124/80  Pulse: (!) 101  Weight: 192 lb (87.1 kg)    Fetal Status: Fetal Heart Rate (bpm): 145   Movement: Present     General:  Alert, oriented and cooperative. Patient is in no acute distress.  Skin: Skin is warm and dry. No rash noted.   Cardiovascular: Normal heart rate noted  Respiratory: Normal respiratory effort, no problems with respiration noted  Abdomen: Soft, gravid, appropriate for gestational age. Pain/Pressure: Present     Pelvic:  Cervical exam deferred        Extremities: Normal range of motion.     Mental Status: Normal mood and affect. Normal behavior. Normal judgment and thought content.   Urinalysis:      Assessment and Plan:  Pregnancy: [id number] at [redacted]w[redacted]d  1. Supervision of high risk pregnancy in third trimester  2. History of eclampsia - taking Baby ASA  3. Chronic hypertension affecting pregnancy - BP clinically stable.  No BP meds.   Preterm labor symptoms and general obstetric precautions including but not limited to vaginal bleeding, contractions, leaking of fluid and fetal movement were reviewed in detail with the patient. Please  refer to After Visit Summary for other counseling recommendations.   Return in about 2 weeks (around 11/02/2021) for [facility].   Brock Bad, MD  10/19/21

## 2021-11-03 ENCOUNTER — Ambulatory Visit (INDEPENDENT_AMBULATORY_CARE_PROVIDER_SITE_OTHER): Payer: Medicaid Other | Admitting: Obstetrics and Gynecology

## 2021-11-03 ENCOUNTER — Encounter: Payer: Self-pay | Admitting: Obstetrics and Gynecology

## 2021-11-03 VITALS — BP 141/87 | HR 108 | Wt 193.0 lb

## 2021-11-03 DIAGNOSIS — O10919 Unspecified pre-existing hypertension complicating pregnancy, unspecified trimester: Secondary | ICD-10-CM | POA: Diagnosis not present

## 2021-11-03 DIAGNOSIS — Z3480 Encounter for supervision of other normal pregnancy, unspecified trimester: Secondary | ICD-10-CM

## 2021-11-03 DIAGNOSIS — Z8759 Personal history of other complications of pregnancy, childbirth and the puerperium: Secondary | ICD-10-CM

## 2021-11-03 NOTE — Progress Notes (Addendum)
Subjective:  Joy Hartman is a 31 y.o. [phone] at 60w5dbeing seen today for ongoing prenatal care.  She is currently monitored for the following issues for this high-risk pregnancy and has Asthma; HEADACHE; Panic attacks; Chlamydia; Chronic nonintractable headache; Supervision of other normal pregnancy, antepartum; History of eclampsia; Carrier of spinal muscular atrophy; and Chronic hypertension affecting pregnancy on their problem list.  Patient reports HA for the last few weeks, relieved with tylenol, but does not take regularly. not taking BP at home regularly.  Contractions: Irritability. Vag. Bleeding: None.  Movement: Present. Denies leaking of fluid.   The following portions of the patient's history were reviewed and updated as appropriate: allergies, current medications, past family history, past medical history, past social history, past surgical history and problem list. Problem list updated.  Objective:   Vitals:   11/03/21 1040 11/03/21 1044  BP: (!) 130/92 (!) 141/87  Pulse: (!) 109 (!) 108  Weight: 193 lb (87.5 kg) 193 lb (87.5 kg)    Fetal Status: Fetal Heart Rate (bpm): 159   Movement: Present     General:  Alert, oriented and cooperative. Patient is in no acute distress.  Skin: Skin is warm and dry. No rash noted.   Cardiovascular: Normal heart rate noted  Respiratory: Normal respiratory effort, no problems with respiration noted  Abdomen: Soft, gravid, appropriate for gestational age. Pain/Pressure: Present     Pelvic:  Cervical exam deferred        Extremities: Normal range of motion.  Edema: None  Mental Status: Normal mood and affect. Normal behavior. Normal judgment and thought content.   Urinalysis:      Assessment and Plan:  Pregnancy: [id number]. Supervision of other normal pregnancy, antepartum Stable Growth scan next week GBS next visit  2. Chronic hypertension affecting pregnancy BP elevated in office today Will check labs Advised to  check BP at home Precautions to present to [facility]U reviewed Information for HTN in pregnancy provided Growth scan as noted above - CBC - Comp Met (CMET) - Protein / creatinine ratio, urine  3. History of eclampsia See above  Preterm labor symptoms and general obstetric precautions including but not limited to vaginal bleeding, contractions, leaking of fluid and fetal movement were reviewed in detail with the patient. Please refer to After Visit Summary for other counseling recommendations.  Return in about 1 week (around 11/10/2021) for OB visit, face to face, MD only.   ErChancy MilroyMD

## 2021-11-03 NOTE — Addendum Note (Signed)
Addended by: Hermina Staggers on: 11/03/2021 11:06 AM   Modules accepted: Orders

## 2021-11-03 NOTE — Patient Instructions (Signed)
Hypertension During Pregnancy Hypertension is also called high blood pressure. High blood pressure means that the force of the blood moving in your body is high enough to cause problems for you and your baby. Different types of high blood pressure can happen during pregnancy. The types are: High blood pressure before you got pregnant. This is called chronic hypertension.  This can continue during your pregnancy. Your doctor will want to keep checking your blood pressure. You may need medicine to control your blood pressure while you are pregnant. You will need follow-up visits after you have your baby. High blood pressure that goes up during pregnancy when it was normal before. This is called gestational hypertension. It will often get better after you have your baby, but your doctor will need to watch your blood pressure to make sure that it is getting better. You may develop high blood pressure after giving birth. This is called postpartum hypertension. This often occurs within 48 hours after childbirth but may occur up to 6 weeks after giving birth. Very high blood pressure during pregnancy is an emergency that needs treatment right away. How does this affect me? If you have high blood pressure during pregnancy, you have a higher chance of developing high blood pressure: As you get older. If you get pregnant again. In some cases, high blood pressure during pregnancy can cause: Stroke. Heart attack. Damage to the kidneys, lungs, or liver. Preeclampsia. HELLP syndrome. Seizures. Problems with the placenta. How does this affect my baby? Your baby may: Be born early. Not weigh as much as he or she should. Not handle labor well, leading to a C-section. This condition may also result in a baby's death before birth (stillbirth). What are the risks? Having high blood pressure during a past pregnancy. Being overweight. Being age 35 or older. Being pregnant for the first time. Being pregnant  with more than one baby. Becoming pregnant using fertility methods, such as IVF. Having other problems, such as diabetes or kidney disease. What can I do to lower my risk?  Keep a healthy weight. Eat a healthy diet. Follow what your doctor tells you about treating any medical problems that you had before you got pregnant. It is very important to go to all of your doctor visits. Your doctor will check your blood pressure and make sure that your pregnancy is progressing as it should. Treatment should start early if a problem is found. How is this treated? Treatment for high blood pressure during pregnancy can vary. It depends on the type of high blood pressure you have and how serious it is. If you were taking medicine for your blood pressure before you got pregnant, talk with your doctor. You may need to change the medicine during pregnancy if it is not safe for your baby. If your blood pressure goes up during pregnancy, your doctor may order medicine to treat this. If you are at risk for preeclampsia, your doctor may tell you to take a low-dose aspirin while you are pregnant. If you have very high blood pressure, you may need to stay in the hospital so you and your baby can be watched closely. You may also need to take medicine to lower your blood pressure. In some cases, if your condition gets worse, you may need to have your baby early. Follow these instructions at home: Eating and drinking  Drink enough fluid to keep your pee (urine) pale yellow. Avoid caffeine. Lifestyle Do not smoke or use any products that contain   nicotine or tobacco. If you need help quitting, ask your doctor. Do not use alcohol or drugs. Avoid stress. Rest and get plenty of sleep. Regular exercise can help. Ask your doctor what kinds of exercise are best for you. General instructions Take over-the-counter and prescription medicines only as told by your doctor. Keep all prenatal and follow-up visits. Contact a  doctor if: You have symptoms that your doctor told you to watch for, such as: Headaches. A feeling like you may vomit (nausea). Vomiting. Belly (abdominal) pain. Feeling dizzy or light-headed. Get help right away if: You have symptoms of serious problems, such as: Very bad belly pain that does not get better with treatment. A very bad headache that does not get better. Blurry vision. Double vision. Vomiting that does not get better. Sudden, fast weight gain. Sudden swelling in your hands, ankles, or face. Bleeding from your vagina. Blood in your pee. Shortness of breath. Chest pain. Weakness on one side of your body. Trouble talking. Your baby is not moving as much as usual. These symptoms may be an emergency. Get help right away. Call your local emergency services (911 in the [place] Do not wait to see if the symptoms will go away. Do not drive yourself to the hospital. Summary High blood pressure is also called hypertension. High blood pressure means that the force of the blood moving in your body is high enough to cause problems for you and your baby. Get help right away if you have symptoms of serious problems due to high blood pressure. Keep all prenatal and follow-up visits. This information is not intended to replace advice given to you by your health care provider. Make sure you discuss any questions you have with your health care provider. Document Revised: 02/05/2020 Document Reviewed: 02/05/2020 Elsevier Patient Education  2023 [employer]. Third Trimester of Pregnancy  The third trimester of pregnancy is from week 28 through week 40. This is months 7 through 9. The third trimester is a time when the unborn baby (fetus) is growing rapidly. At the end of the ninth month, the fetus is about 20 inches long and weighs 6-10 pounds. Body changes during your third trimester During the third trimester, your body will continue to go through many changes. The changes vary and  generally return to normal after your baby is born. Physical changes Your weight will continue to increase. You can expect to gain 25-35 pounds (11-16 kg) by the end of the pregnancy if you begin pregnancy at a normal weight. If you are underweight, you can expect to gain 28-40 lb (about 13-18 kg), and if you are overweight, you can expect to gain 15-25 lb (about 7-11 kg). You may begin to get stretch marks on your hips, abdomen, and breasts. Your breasts will continue to grow and may hurt. A yellow fluid (colostrum) may leak from your breasts. This is the first milk you are producing for your baby. You may have changes in your hair. These can include thickening of your hair, rapid growth, and changes in texture. Some people also have hair loss during or after pregnancy, or hair that feels dry or thin. Your belly button may stick out. You may notice more swelling in your hands, face, or ankles. Health changes You may have heartburn. You may have constipation. You may develop hemorrhoids. You may develop swollen, bulging veins (varicose veins) in your legs. You may have increased body aches in the pelvis, back, or thighs. This is due to weight gain  and increased hormones that are relaxing your joints. You may have increased tingling or numbness in your hands, arms, and legs. The skin on your abdomen may also feel numb. You may feel short of breath because of your expanding uterus. Other changes You may urinate more often because the fetus is moving lower into your pelvis and pressing on your bladder. You may have more problems sleeping. This may be caused by the size of your abdomen, an increased need to urinate, and an increase in your body's metabolism. You may notice the fetus "dropping," or moving lower in your abdomen (lightening). You may have increased vaginal discharge. You may notice that you have pain around your pelvic bone as your uterus distends. Follow these instructions at  home: Medicines Follow your health care provider's instructions regarding medicine use. Specific medicines may be either safe or unsafe to take during pregnancy. Do not take any medicines unless approved by your health care provider. Take a prenatal vitamin that contains at least 600 micrograms (mcg) of folic acid. Eating and drinking Eat a healthy diet that includes fresh fruits and vegetables, whole grains, good sources of protein such as meat, eggs, or tofu, and low-fat dairy products. Avoid raw meat and unpasteurized juice, milk, and cheese. These carry germs that can harm you and your baby. Eat 4 or 5 small meals rather than 3 large meals a day. You may need to take these actions to prevent or treat constipation: Drink enough fluid to keep your urine pale yellow. Eat foods that are high in fiber, such as beans, whole grains, and fresh fruits and vegetables. Limit foods that are high in fat and processed sugars, such as fried or sweet foods. Activity Exercise only as directed by your health care provider. Most people can continue their usual exercise routine during pregnancy. Try to exercise for 30 minutes at least 5 days a week. Stop exercising if you experience contractions in the uterus. Stop exercising if you develop pain or cramping in the lower abdomen or lower back. Avoid heavy lifting. Do not exercise if it is very hot or humid or if you are at a high altitude. If you choose to, you may continue to have sex unless your health care provider tells you not to. Relieving pain and discomfort Take frequent breaks and rest with your legs raised (elevated) if you have leg cramps or low back pain. Take warm sitz baths to soothe any pain or discomfort caused by hemorrhoids. Use hemorrhoid cream if your health care provider approves. Wear a supportive bra to prevent discomfort from breast tenderness. If you develop varicose veins: Wear support hose as told by your health care  provider. Elevate your feet for 15 minutes, 3-4 times a day. Limit salt in your diet. Safety Talk to your health care provider before traveling far distances. Do not use hot tubs, steam rooms, or saunas. Wear your seat belt at all times when driving or riding in a car. Talk with your health care provider if someone is verbally or physically abusive to you. Preparing for birth To prepare for the arrival of your baby: Take prenatal classes to understand, practice, and ask questions about labor and delivery. Visit the hospital and tour the maternity area. Purchase a rear-facing car seat and make sure you know how to install it in your car. Prepare the baby's room or sleeping area. Make sure to remove all pillows and stuffed animals from the baby's crib to prevent suffocation. General instructions Avoid cat  litter boxes and soil used by cats. These carry germs that can cause birth defects in the baby. If you have a cat, ask someone to clean the litter box for you. Do not douche or use tampons. Do not use scented sanitary pads. Do not use any products that contain nicotine or tobacco, such as cigarettes, e-cigarettes, and chewing tobacco. If you need help quitting, ask your health care provider. Do not use any herbal remedies, illegal drugs, or medicines that were not prescribed to you. Chemicals in these products can harm your baby. Do not drink alcohol. You will have more frequent prenatal exams during the third trimester. During a routine prenatal visit, your health care provider will do a physical exam, perform tests, and discuss your overall health. Keep all follow-up visits. This is important. Where to find more information American Pregnancy Association: americanpregnancy.org [employer]: https://www.todd-brady.net/acog.org/en/Womens%20Health/Pregnancy Office on [employer]: [employer].co.[place]omenshealth.gov/pregnancy Contact a health care provider if you have: A fever. Mild pelvic  cramps, pelvic pressure, or nagging pain in your abdominal area or lower back. Vomiting or diarrhea. Bad-smelling vaginal discharge or foul-smelling urine. Pain when you urinate. A headache that does not go away when you take medicine. Visual changes or see spots in front of your eyes. Get help right away if: Your water breaks. You have regular contractions less than 5 minutes apart. You have spotting or bleeding from your vagina. You have severe abdominal pain. You have difficulty breathing. You have chest pain. You have fainting spells. You have not felt your baby move for the time period told by your health care provider. You have new or increased pain, swelling, or redness in an arm or leg. Summary The third trimester of pregnancy is from week 28 through week 40 (months 7 through 9). You may have more problems sleeping. This can be caused by the size of your abdomen, an increased need to urinate, and an increase in your body's metabolism. You will have more frequent prenatal exams during the third trimester. Keep all follow-up visits. This is important. This information is not intended to replace advice given to you by your health care provider. Make sure you discuss any questions you have with your health care provider. Document Revised: 10/22/2019 Document Reviewed: 08/28/2019 Elsevier Patient Education  2023 [employer]

## 2021-11-03 NOTE — Progress Notes (Signed)
HROB, c/o HA 10/10, blurry vision, floaters x 3 weeks.

## 2021-11-04 LAB — CBC
Hematocrit: 29.9 % — ABNORMAL LOW (ref 34.0–46.6)
Hemoglobin: 10.2 g/dL — ABNORMAL LOW (ref 11.1–15.9)
MCH: 32.4 pg (ref 26.6–33.0)
MCHC: 34.1 g/dL (ref 31.5–35.7)
MCV: 95 fL (ref 79–97)
Platelets: 171 10*3/uL (ref 150–450)
RBC: 3.15 x10E6/uL — ABNORMAL LOW (ref 3.77–5.28)
RDW: 13 % (ref 11.7–15.4)
WBC: 8.2 10*3/uL (ref 3.4–10.8)

## 2021-11-04 LAB — COMPREHENSIVE METABOLIC PANEL
ALT: 7 IU/L (ref 0–32)
AST: 12 IU/L (ref 0–40)
Albumin/Globulin Ratio: 1.2 (ref 1.2–2.2)
Albumin: 3.7 g/dL — ABNORMAL LOW (ref 3.9–5.0)
Alkaline Phosphatase: 102 IU/L (ref 44–121)
BUN/Creatinine Ratio: 7 — ABNORMAL LOW (ref 9–23)
BUN: 4 mg/dL — ABNORMAL LOW (ref 6–20)
Bilirubin Total: <0.2 mg/dL (ref 0.0–1.2)
CO2: 21 mmol/L (ref 20–29)
Calcium: 9.5 mg/dL (ref 8.7–10.2)
Chloride: 103 mmol/L (ref 96–106)
Creatinine, Ser: 0.56 mg/dL — ABNORMAL LOW (ref 0.57–1.00)
Globulin, Total: 3.2 g/dL (ref 1.5–4.5)
Glucose: 97 mg/dL (ref 70–99)
Potassium: 3.6 mmol/L (ref 3.5–5.2)
Sodium: 138 mmol/L (ref 134–144)
Total Protein: 6.9 g/dL (ref 6.0–8.5)
eGFR: 127 mL/min/{1.73_m2} (ref >59)

## 2021-11-04 LAB — PROTEIN / CREATININE RATIO, URINE
Creatinine, Urine: 138.8 mg/dL
Protein, Ur: 19.1 mg/dL
Protein/Creat Ratio: 138 mg/g creat (ref 0–200)

## 2021-11-10 ENCOUNTER — Ambulatory Visit: Payer: Medicaid Other | Admitting: *Deleted

## 2021-11-10 ENCOUNTER — Ambulatory Visit: Payer: Medicaid Other | Attending: Maternal & Fetal Medicine

## 2021-11-10 ENCOUNTER — Other Ambulatory Visit: Payer: Self-pay | Admitting: Maternal & Fetal Medicine

## 2021-11-10 ENCOUNTER — Encounter: Payer: Self-pay | Admitting: *Deleted

## 2021-11-10 VITALS — BP 137/80 | HR 102

## 2021-11-10 DIAGNOSIS — Z362 Encounter for other antenatal screening follow-up: Secondary | ICD-10-CM

## 2021-11-10 DIAGNOSIS — O10913 Unspecified pre-existing hypertension complicating pregnancy, third trimester: Secondary | ICD-10-CM | POA: Diagnosis not present

## 2021-11-10 DIAGNOSIS — O09293 Supervision of pregnancy with other poor reproductive or obstetric history, third trimester: Secondary | ICD-10-CM

## 2021-11-10 DIAGNOSIS — Z3A36 36 weeks gestation of pregnancy: Secondary | ICD-10-CM

## 2021-11-10 DIAGNOSIS — O10013 Pre-existing essential hypertension complicating pregnancy, third trimester: Secondary | ICD-10-CM | POA: Diagnosis not present

## 2021-11-10 DIAGNOSIS — O0933 Supervision of pregnancy with insufficient antenatal care, third trimester: Secondary | ICD-10-CM | POA: Diagnosis not present

## 2021-11-11 ENCOUNTER — Other Ambulatory Visit (HOSPITAL_COMMUNITY)
Admission: RE | Admit: 2021-11-11 | Discharge: 2021-11-11 | Disposition: A | Discharge status: Home / Self Care | Payer: Medicaid Other | Source: Ambulatory Visit | Attending: Obstetrics and Gynecology | Admitting: Obstetrics and Gynecology

## 2021-11-11 ENCOUNTER — Ambulatory Visit (INDEPENDENT_AMBULATORY_CARE_PROVIDER_SITE_OTHER): Payer: Medicaid Other | Admitting: Obstetrics and Gynecology

## 2021-11-11 ENCOUNTER — Encounter: Payer: Self-pay | Admitting: Obstetrics and Gynecology

## 2021-11-11 VITALS — BP 123/81 | HR 103 | Wt 192.0 lb

## 2021-11-11 DIAGNOSIS — Z3480 Encounter for supervision of other normal pregnancy, unspecified trimester: Secondary | ICD-10-CM | POA: Diagnosis not present

## 2021-11-11 DIAGNOSIS — O10919 Unspecified pre-existing hypertension complicating pregnancy, unspecified trimester: Secondary | ICD-10-CM

## 2021-11-11 DIAGNOSIS — Z3A36 36 weeks gestation of pregnancy: Secondary | ICD-10-CM

## 2021-11-11 NOTE — Patient Instructions (Signed)
Vaginal Delivery  Vaginal delivery means that you give birth by pushing your baby out of your birth canal (vagina). Your health care team will help you before, during, and after vaginal delivery. Birth experiences are unique for every woman and every pregnancy, and birth experiences vary depending on where you choose to give birth. What are the risks and benefits? Generally, this is safe. However, problems may occur, including: Bleeding. Infection. Damage to other structures such as vaginal tearing. Allergic reactions to medicines. Despite the risks, benefits of vaginal delivery include less risk of bleeding and infection and a shorter recovery time compared to a Cesarean delivery. Cesarean delivery, or C-section, is the surgical delivery of a baby. What happens when I arrive at the birth center or hospital? Once you are in labor and have been admitted into the hospital or birth center, your health care team may: Review your pregnancy history and any concerns that you have. Talk with you about your birth plan and discuss pain control options. Check your blood pressure, breathing, and heartbeat. Assess your baby's heartbeat. Monitor your uterus for contractions. Check whether your bag of water (amniotic sac) has broken (ruptured). Insert an IV into one of your veins. This may be used to give you fluids and medicines. Monitoring Your health care team may assess your contractions (uterine monitoring) and your baby's heart rate (fetal monitoring). You may need to be monitored: Often, but not continuously (intermittently). All the time or for long periods at a time (continuously). Continuous monitoring may be needed if: You are taking certain medicines, such as medicine to relieve pain or make your contractions stronger. You have pregnancy or labor complications. Monitoring may be done by: Placing a special stethoscope or a handheld monitoring device on your abdomen to check your baby's  heartbeat and to check for contractions. Placing monitors on your abdomen (external monitors) to record your baby's heartbeat and the frequency and length of contractions. Placing monitors inside your uterus through your vagina (internal monitors) to record your baby's heartbeat and the frequency, length, and strength of your contractions. Depending on the type of monitor, it may remain in your uterus or on your baby's head until birth. Telemetry. This is a type of continuous monitoring that can be done with external or internal monitors. Instead of having to stay in bed, you are able to move around. Physical exam Your health care team may perform frequent physical exams. This may include: Checking how and where your baby is positioned in your uterus. Checking your cervix to determine: Whether it is thinning out (effacing). Whether it is opening up (dilating). What happens during labor and delivery?  Normal labor and delivery is divided into the following three stages: Stage 1 This is the longest stage of labor. Throughout this stage, you will feel contractions. Contractions generally feel mild, infrequent, and irregular at first. They get stronger, more frequent, and more regular as you move through this stage. You may have contractions about every 2-3 minutes. This stage ends when your cervix is completely dilated to 4 inches (10 cm) and completely effaced. Stage 2 This stage starts once your cervix is completely effaced and dilated and lasts until the delivery of your baby. This is the stage where you will feel an urge to push your baby out of your vagina. You may feel stretching and burning pain, especially when the widest part of your baby's head passes through the vaginal opening (crowning). Once your baby is delivered, the umbilical cord will be   clamped and cut. Timing of cutting the cord will depend on your wishes, your baby's health, and your health care provider's practices. Your baby  will be placed on your bare chest (skin-to-skin contact) in an upright position and covered with a warm blanket. If you are choosing to breastfeed, watch your baby for feeding cues, like rooting or sucking, and help the baby to your breast for his or her first feeding. Stage 3 This stage starts immediately after the birth of your baby and ends after you deliver the placenta. This stage may take anywhere from 5 to 30 minutes. After your baby has been delivered, you will feel contractions as your body expels the placenta. These contractions also help your uterus get smaller and reduce bleeding. What can I expect after labor and delivery? After labor is over, you and your baby will be assessed closely until you are ready to go home. Your health care team will teach you how to care for yourself and your baby. You and your baby may be encouraged to stay in the same room (rooming in) during your hospital stay. This will help promote early bonding and successful breastfeeding. Your uterus will be checked and massaged regularly (fundal massage). You may continue to receive fluids and medicines through an IV. You will have some soreness and pain in your abdomen, vagina, and the area of skin between your vaginal opening and your anus (perineum). If an incision was made near your vagina (episiotomy) or if you had some vaginal tearing during delivery, cold compresses may be placed on your episiotomy or your tear. This helps to reduce pain and swelling. It is normal to have vaginal bleeding after delivery. Wear a sanitary pad for vaginal bleeding and discharge. Summary Vaginal delivery means that you will give birth by pushing your baby out of your birth canal (vagina). Your health care team will monitor you and your baby throughout the stages of labor. After you deliver your baby, your health care team will continue to assess you and your baby to ensure you are both recovering as expected after delivery. This  information is not intended to replace advice given to you by your health care provider. Make sure you discuss any questions you have with your health care provider. Document Revised: 04/12/2020 Document Reviewed: 04/12/2020 Elsevier Patient Education  2023 [employer] Inc.  

## 2021-11-11 NOTE — Progress Notes (Signed)
ROB GBS 

## 2021-11-11 NOTE — Progress Notes (Signed)
Subjective:  Joy Hartman is a 31 y.o. [phone] at [redacted]w[redacted]d being seen today for ongoing prenatal care.  She is currently monitored for the following issues for this high-risk pregnancy and has Asthma; HEADACHE; Panic attacks; Chlamydia; Chronic nonintractable headache; Supervision of other normal pregnancy, antepartum; History of eclampsia; Carrier of spinal muscular atrophy; and Chronic hypertension affecting pregnancy on their problem list.  Patient reports general discomforts of pregnancy.  Contractions: Not present. Vag. Bleeding: None.  Movement: Present. Denies leaking of fluid.   The following portions of the patient's history were reviewed and updated as appropriate: allergies, current medications, past family history, past medical history, past social history, past surgical history and problem list. Problem list updated.  Objective:   Vitals:   11/11/21 0846  BP: 123/81  Pulse: (!) 103  Weight: 192 lb (87.1 kg)    Fetal Status: Fetal Heart Rate (bpm): 159   Movement: Present     General:  Alert, oriented and cooperative. Patient is in no acute distress.  Skin: Skin is warm and dry. No rash noted.   Cardiovascular: Normal heart rate noted  Respiratory: Normal respiratory effort, no problems with respiration noted  Abdomen: Soft, gravid, appropriate for gestational age. Pain/Pressure: Present     Pelvic:  Cervical exam performed        Extremities: Normal range of motion.  Edema: None  Mental Status: Normal mood and affect. Normal behavior. Normal judgment and thought content.   Urinalysis:      Assessment and Plan:  Pregnancy: [id number] at [redacted]w[redacted]d  1. Supervision of other normal pregnancy, antepartum Labor precautions - Culture, beta strep (group b only) - Cervicovaginal ancillary only( [place])  2. CHTN  BP stable. No S/Sx of PEC. Normal growth Continue with weekly antenatal testing as per protocol IOL scheduled  Term labor symptoms and general obstetric  precautions including but not limited to vaginal bleeding, contractions, leaking of fluid and fetal movement were reviewed in detail with the patient. Please refer to After Visit Summary for other counseling recommendations.  Return in about 1 week (around 11/18/2021) for OB visit, face to face, MD only.   Hermina Staggers, MD

## 2021-11-13 LAB — CERVICOVAGINAL ANCILLARY ONLY
Chlamydia: NEGATIVE
Comment: NEGATIVE
Comment: NORMAL
Neisseria Gonorrhea: NEGATIVE

## 2021-11-16 LAB — CULTURE, BETA STREP (GROUP B ONLY): Strep Gp B Culture: NEGATIVE

## 2021-11-21 ENCOUNTER — Encounter: Payer: Self-pay | Admitting: Obstetrics and Gynecology

## 2021-11-21 ENCOUNTER — Ambulatory Visit (INDEPENDENT_AMBULATORY_CARE_PROVIDER_SITE_OTHER): Payer: Medicaid Other | Admitting: Obstetrics and Gynecology

## 2021-11-21 VITALS — BP 135/84 | HR 103 | Wt 197.0 lb

## 2021-11-21 DIAGNOSIS — Z3480 Encounter for supervision of other normal pregnancy, unspecified trimester: Secondary | ICD-10-CM

## 2021-11-21 DIAGNOSIS — O10919 Unspecified pre-existing hypertension complicating pregnancy, unspecified trimester: Secondary | ICD-10-CM

## 2021-11-30 ENCOUNTER — Other Ambulatory Visit: Payer: Self-pay

## 2021-11-30 ENCOUNTER — Inpatient Hospital Stay (HOSPITAL_COMMUNITY)
Admission: AD | Admit: 2021-11-30 | Discharge: 2021-12-02 | DRG: 806 | Disposition: A | Discharge status: Home / Self Care | Payer: Medicaid Other | Attending: Obstetrics & Gynecology | Admitting: Obstetrics & Gynecology

## 2021-11-30 ENCOUNTER — Ambulatory Visit (INDEPENDENT_AMBULATORY_CARE_PROVIDER_SITE_OTHER): Payer: Medicaid Other | Admitting: Obstetrics and Gynecology

## 2021-11-30 ENCOUNTER — Encounter (HOSPITAL_COMMUNITY): Payer: Self-pay | Admitting: Obstetrics & Gynecology

## 2021-11-30 VITALS — BP 138/92 | HR 103 | Wt 196.8 lb

## 2021-11-30 DIAGNOSIS — Z3480 Encounter for supervision of other normal pregnancy, unspecified trimester: Principal | ICD-10-CM

## 2021-11-30 DIAGNOSIS — O1002 Pre-existing essential hypertension complicating childbirth: Secondary | ICD-10-CM | POA: Diagnosis not present

## 2021-11-30 DIAGNOSIS — Z8759 Personal history of other complications of pregnancy, childbirth and the puerperium: Secondary | ICD-10-CM

## 2021-11-30 DIAGNOSIS — O10919 Unspecified pre-existing hypertension complicating pregnancy, unspecified trimester: Secondary | ICD-10-CM

## 2021-11-30 DIAGNOSIS — O1414 Severe pre-eclampsia complicating childbirth: Secondary | ICD-10-CM | POA: Diagnosis present

## 2021-11-30 DIAGNOSIS — Z3A39 39 weeks gestation of pregnancy: Secondary | ICD-10-CM

## 2021-11-30 DIAGNOSIS — O1410 Severe pre-eclampsia, unspecified trimester: Secondary | ICD-10-CM

## 2021-11-30 DIAGNOSIS — O9081 Anemia of the puerperium: Secondary | ICD-10-CM | POA: Diagnosis not present

## 2021-11-30 DIAGNOSIS — Z148 Genetic carrier of other disease: Secondary | ICD-10-CM

## 2021-11-30 DIAGNOSIS — O0990 Supervision of high risk pregnancy, unspecified, unspecified trimester: Secondary | ICD-10-CM

## 2021-11-30 DIAGNOSIS — D62 Acute posthemorrhagic anemia: Secondary | ICD-10-CM | POA: Diagnosis not present

## 2021-11-30 DIAGNOSIS — O114 Pre-existing hypertension with pre-eclampsia, complicating childbirth: Secondary | ICD-10-CM | POA: Diagnosis present

## 2021-11-30 HISTORY — DX: Essential (primary) hypertension: I10

## 2021-11-30 LAB — PROTEIN / CREATININE RATIO, URINE
Creatinine, Urine: 177.98 mg/dL
Protein Creatinine Ratio: 0.1 mg/mg{Cre} (ref 0.00–0.15)
Total Protein, Urine: 17 mg/dL

## 2021-11-30 LAB — TYPE AND SCREEN
ABO/RH(D): O POS
Antibody Screen: NEGATIVE

## 2021-11-30 LAB — CBC
HCT: 29.9 % — ABNORMAL LOW (ref 36.0–46.0)
Hemoglobin: 10.3 g/dL — ABNORMAL LOW (ref 12.0–15.0)
MCH: 32.6 pg (ref 26.0–34.0)
MCHC: 34.4 g/dL (ref 30.0–36.0)
MCV: 94.6 fL (ref 80.0–100.0)
Platelets: 171 10*3/uL (ref 150–400)
RBC: 3.16 MIL/uL — ABNORMAL LOW (ref 3.87–5.11)
RDW: 12.6 % (ref 11.5–15.5)
WBC: 7.7 10*3/uL (ref 4.0–10.5)
nRBC: 0 % (ref 0.0–0.2)

## 2021-11-30 LAB — COMPREHENSIVE METABOLIC PANEL
ALT: 11 U/L (ref 0–44)
AST: 13 U/L — ABNORMAL LOW (ref 15–41)
Albumin: 2.8 g/dL — ABNORMAL LOW (ref 3.5–5.0)
Alkaline Phosphatase: 94 U/L (ref 38–126)
Anion gap: 9 (ref 5–15)
BUN: 7 mg/dL (ref 6–20)
CO2: 20 mmol/L — ABNORMAL LOW (ref 22–32)
Calcium: 8.7 mg/dL — ABNORMAL LOW (ref 8.9–10.3)
Chloride: 106 mmol/L (ref 98–111)
Creatinine, Ser: 0.5 mg/dL (ref 0.44–1.00)
GFR, Estimated: >60 mL/min (ref >60)
Glucose, Bld: 75 mg/dL (ref 70–99)
Potassium: 3.7 mmol/L (ref 3.5–5.1)
Sodium: 135 mmol/L (ref 135–145)
Total Bilirubin: 0.4 mg/dL (ref 0.3–1.2)
Total Protein: 6.5 g/dL (ref 6.5–8.1)

## 2021-11-30 LAB — URINALYSIS, ROUTINE W REFLEX MICROSCOPIC
Bilirubin Urine: NEGATIVE
Glucose, UA: NEGATIVE mg/dL
Hgb urine dipstick: NEGATIVE
Ketones, ur: NEGATIVE mg/dL
Nitrite: NEGATIVE
Protein, ur: NEGATIVE mg/dL
Specific Gravity, Urine: 1.024 (ref 1.005–1.030)
pH: 6 (ref 5.0–8.0)

## 2021-11-30 MED ORDER — LIDOCAINE HCL (PF) 1 % IJ SOLN
30.0000 mL | INTRAMUSCULAR | Status: AC | PRN
Start: 2021-11-30 — End: 2021-11-30
  Administered 2021-11-30: 30 mL via SUBCUTANEOUS
  Filled 2021-11-30: qty 30

## 2021-11-30 MED ORDER — OXYTOCIN-SODIUM CHLORIDE 30-0.9 UT/500ML-% IV SOLN
2.5000 [IU]/h | INTRAVENOUS | Status: DC
  Administered 2021-12-01: 2.5 [IU]/h via INTRAVENOUS

## 2021-11-30 MED ORDER — LABETALOL HCL 5 MG/ML IV SOLN: 80.0000 mg | INTRAVENOUS | Status: DC | PRN

## 2021-11-30 MED ORDER — MAGNESIUM SULFATE BOLUS VIA INFUSION
4.0000 g | Freq: Once | INTRAVENOUS | Status: AC
  Administered 2021-11-30: 4 g via INTRAVENOUS
  Filled 2021-11-30: qty 1000

## 2021-11-30 MED ORDER — TRANEXAMIC ACID-NACL 1000-0.7 MG/100ML-% IV SOLN
1000.0000 mg | Freq: Once | INTRAVENOUS | Status: AC
Start: 2021-12-01 — End: 2021-12-01

## 2021-11-30 MED ORDER — LACTATED RINGERS IV SOLN: 500.0000 mL | INTRAVENOUS | Status: DC | PRN

## 2021-11-30 MED ORDER — ONDANSETRON HCL 4 MG/2ML IJ SOLN: 4.0000 mg | Freq: Four times a day (QID) | INTRAMUSCULAR | Status: DC | PRN

## 2021-11-30 MED ORDER — LABETALOL HCL 5 MG/ML IV SOLN: 40.0000 mg | INTRAVENOUS | Status: DC | PRN

## 2021-11-30 MED ORDER — TERBUTALINE SULFATE 1 MG/ML IJ SOLN: 0.2500 mg | Freq: Once | INTRAMUSCULAR | Status: DC | PRN

## 2021-11-30 MED ORDER — OXYTOCIN BOLUS FROM INFUSION
333.0000 mL | Freq: Once | INTRAVENOUS | Status: AC
Start: 2021-11-30 — End: 2021-11-30
  Administered 2021-11-30: 333 mL via INTRAVENOUS

## 2021-11-30 MED ORDER — OXYCODONE-ACETAMINOPHEN 5-325 MG PO TABS: 1.0000 | ORAL_TABLET | ORAL | Status: DC | PRN

## 2021-11-30 MED ORDER — SOD CITRATE-CITRIC ACID 500-334 MG/5ML PO SOLN
30.0000 mL | ORAL | Status: DC | PRN
Start: 2021-11-30 — End: 2021-12-01

## 2021-11-30 MED ORDER — ALBUTEROL SULFATE (2.5 MG/3ML) 0.083% IN NEBU: 3.0000 mL | INHALATION_SOLUTION | RESPIRATORY_TRACT | Status: DC | PRN

## 2021-11-30 MED ORDER — TRANEXAMIC ACID-NACL 1000-0.7 MG/100ML-% IV SOLN
INTRAVENOUS | Status: AC
  Administered 2021-11-30: 1000 mg via INTRAVENOUS
  Filled 2021-11-30: qty 100

## 2021-11-30 MED ORDER — ACETAMINOPHEN 325 MG PO TABS: 650.0000 mg | ORAL_TABLET | ORAL | Status: DC | PRN

## 2021-11-30 MED ORDER — LABETALOL HCL 5 MG/ML IV SOLN: 20.0000 mg | INTRAVENOUS | Status: DC | PRN

## 2021-11-30 MED ORDER — LACTATED RINGERS IV SOLN: INTRAVENOUS | Status: DC

## 2021-11-30 MED ORDER — OXYCODONE-ACETAMINOPHEN 5-325 MG PO TABS: 2.0000 | ORAL_TABLET | ORAL | Status: DC | PRN

## 2021-11-30 MED ORDER — OXYTOCIN-SODIUM CHLORIDE 30-0.9 UT/500ML-% IV SOLN
1.0000 m[IU]/min | INTRAVENOUS | Status: DC
  Administered 2021-11-30: 2 m[IU]/min via INTRAVENOUS
  Filled 2021-11-30: qty 500

## 2021-11-30 MED ORDER — MAGNESIUM SULFATE 40 GM/1000ML IV SOLN
2.0000 g/h | INTRAVENOUS | Status: DC
  Filled 2021-11-30: qty 1000

## 2021-11-30 MED ORDER — HYDRALAZINE HCL 20 MG/ML IJ SOLN: 10.0000 mg | INTRAMUSCULAR | Status: DC | PRN

## 2021-11-30 MED ORDER — TRANEXAMIC ACID-NACL 1000-0.7 MG/100ML-% IV SOLN: 1000.0000 mg | INTRAVENOUS | Status: DC

## 2021-11-30 NOTE — Progress Notes (Signed)
Labor Progress Note Joy Hartman is a 31 y.o. [id number] at [redacted]w[redacted]d who presented for IOL due to cHTN with SIPE w/ SF.  S: Doing well. Headache now resolved. No concerns.   O:  BP 139/80   Pulse 92   Temp 97.8 F (36.6 C) (Oral)   Resp 19   Ht 5\' 6"  (1.676 m)   Wt 90.1 kg   LMP 02/26/2021 (Approximate)   SpO2 100%   BMI 32.05 kg/m   EFM: Baseline 145 bpm, moderate variability, + accels, no decels  Toco: Occasional contractions   CVE: Dilation: 5 Effacement (%): 50 Station: -2 Presentation: Vertex Exam by:: [email or web address], RN  A&P: 31 y.o. 37 [redacted]w[redacted]d   #Labor: Favorable SVE. Will start Pitocin 2x2 and plan for AROM on next exam.  #Pain: PRN; coping well  #FWB: Cat 1  #GBS negative  #cHTN with SIPE with severe features: On Mag. Headache and vision changes now resolved. BP within normal limits at this time. Will continue to monitor and plan to repeat labs in 12 hours with MG level.   [redacted]w[redacted]d, MD 5:39 PM

## 2021-11-30 NOTE — H&P (Signed)
OBSTETRIC ADMISSION HISTORY AND PHYSICAL  Joy Hartman is a 31 y.o. female [phone] with IUP at [redacted]w[redacted]d by LMP presented to [facility]U for pre-eclampsia work  up and now being admitted for IOL for cHTN with SIPE with severe features (HA and seeing spots). She reports +FMs, No LOF, no VB.  She plans on breast feeding. She request POPs or Depo bridge and then 6 week interim BTL for birth control. She received her prenatal care at  [facility]    Dating: By LMP --->  Estimated Date of Delivery: 12/03/21  Sono:   @[redacted]w[redacted]d , CWD, normal anatomy, cephalic presentation, anterior placental lie, 2663g, 21% EFW    Prenatal History/Complications:  Hx of asthma - PRN albuterol use SMA carrier cHTN in pregnancy not on meds Hx of post partum eclampsia in prior pregnancy  Past Medical History: Past Medical History:  Diagnosis Date   Asthma    Chlamydia    Gonorrhea    Headache    Panic attack    Trichomonas     Past Surgical History: History reviewed. No pertinent surgical history.  Obstetrical History: OB History     Gravida  4   Para  2   Term  2   Preterm  0   AB  1   Living  2      SAB  1   IAB  0   Ectopic  0   Multiple  0   Live Births  2           Social History Social History   Socioeconomic History   Marital status: Single    Spouse name: Not on file   Number of children: 2   Years of education: Not on file   Highest education level: Not on file  Occupational History   Not on file  Tobacco Use   Smoking status: Never   Smokeless tobacco: Never  Vaping Use   Vaping Use: Never used  Substance and Sexual Activity   Alcohol use: No   Drug use: Not Currently    Types: Marijuana    Comment: Last used 1 month ago   Sexual activity: Yes    Birth control/protection: None  Other Topics Concern   Not on file  Social History Narrative   Two children    Social Determinants of Health   Financial Resource Strain: Not on file  Food Insecurity: Not on file   Transportation Needs: Not on file  Physical Activity: Not on file  Stress: Not on file  Social Connections: Not on file    Family History: Family History  Problem Relation Age of Onset   Asthma Mother    Hypertension Mother    Hypertension Father    Hypertension Maternal Grandmother    Hypertension Maternal Grandfather    Hypertension Paternal Grandmother    Hypertension Paternal Grandfather     Allergies: Allergies  Allergen Reactions   Codeine Hives, Itching, Rash and Swelling   Hydrocodone Itching and Swelling    Itching and swelling    Medications Prior to Admission  Medication Sig Dispense Refill Last Dose   acetaminophen (TYLENOL) 500 MG tablet Take 1,000 mg by mouth every 6 (six) hours as needed.   11/30/2021 at 0900   aspirin EC 81 MG tablet Take 1 tablet (81 mg total) by mouth daily. 60 tablet 2    Elastic Bandages & Supports (COMFORT FIT MATERNITY SUPP SM) MISC Wear as directed. 1 each 0    ferrous sulfate 325 (  65 FE) MG tablet Take 1 tablet (325 mg total) by mouth every other day. 30 tablet 3    Prenatal Vit-Fe Fumarate-FA (PREPLUS) 27-1 MG TABS Take 1 tablet by mouth daily. 90 tablet 3      Review of Systems   All systems reviewed and negative except as stated in HPI  Blood pressure (!) 141/83, pulse 86, temperature 97.9 F (36.6 C), temperature source Oral, resp. rate 18, height 5\' 6"  (1.676 m), weight 90.1 kg, last menstrual period 02/26/2021, SpO2 99 %, unknown if currently breastfeeding. General appearance: alert, appears comfortable Lungs: clear to auscultation bilaterally Heart: regular rate and rhythm Abdomen: soft, non-tender; bowel sounds normal Extremities: Homans sign is negative, no sign of DVT Presentation: cephalic by BSUS  Fetal monitoringBaseline: 150 bpm, Variability: Good {> 6 bpm), Accelerations: Reactive, and Decelerations: Absent Uterine activity irregular Exam by:: Dr [email or web address]   Prenatal labs: ABO, Rh: --/--/O POS (04/04  1258) Antibody:  pending Rubella: 2.14 (04/04 1258) RPR: Non Reactive (04/26 1029)  HBsAg: NON REACTIVE (04/04 1258)  HIV: Non Reactive (04/04 1258)  GBS: Negative/-- (06/16 1059)  2 hr Glucola normal Genetic screening  LR NIPS, increased carrier risk for SMA Anatomy 12-13-2000 normal   Pt informed that the ultrasound is considered a limited OB ultrasound and is not intended to be a complete ultrasound exam.  Patient also informed that the ultrasound is not being completed with the intent of assessing for fetal or placental anomalies or any pelvic abnormalities.  Explained that the purpose of today's ultrasound is to assess for  presentation.  Patient acknowledges the purpose of the exam and the limitations of the study.    Confirmed vertex by BSUS in MAU today Prenatal Transfer Tool  Maternal Diabetes: No Genetic Screening: Normal Maternal Ultrasounds/Referrals: Normal Fetal Ultrasounds or other Referrals:  None Maternal Substance Abuse:  No Significant Maternal Medications:  None Significant Maternal Lab Results: Group B Strep negative  No results found for this or any previous visit (from the past 24 hour(s)).  Patient Active Problem List   Diagnosis Date Noted   Chronic hypertension affecting pregnancy 11/03/2021   Carrier of spinal muscular atrophy 10/17/2021   History of eclampsia 10/05/2021   Supervision of other normal pregnancy, antepartum 09/20/2021   Chronic nonintractable headache 09/07/2020   Panic attacks 03/30/2014   Asthma 07/13/2010   HEADACHE 07/13/2010    Assessment/Plan:  [name] is a 31 y.o. 37 at [redacted]w[redacted]d here for headaches, blurry vision, and elevated BP in setting of cHTN and eclamptic seizure in prior pregnancy.  #Labor:  Cervix 3 cm in office today. L&D team assess cervix and start IOL.  #FWB: Cat I #ID:  GBS neg #MOF: breast #MOC: would like interval tubal and in the meantime considering depo vs. POPs #Circ:  N/A  #cHTN with SIPE with  severe features (HA and floaters) Patient started having HA yesterday. BP yesterday at home in 140/90s. Today in office BP borderline and here Bps borderline. Took tylenol at 9AM and no improvement in HA or blurry vision.  Discussed with Dr. [redacted]w[redacted]d and given patient's symptoms as well as cHTN with hx of prior eclamptic seizure recommended admit and IOL for suspected cHTN with SIPE with severe features. Patient amenable to plan. - L&D team messaged  - Mg ordered for seizure ppx  #Hx of asthma - only uses albuterol PRN. Last use a few days ago - PRN albuterol ordered  Macon Large, MD, MPH OB Fellow, Faculty Practice

## 2021-11-30 NOTE — Progress Notes (Signed)
Labor Progress Note Joy Hartman is a 31 y.o. [id number] at [redacted]w[redacted]d presented for IOL due to cHTN with SIPE/SF.   S: Doing well. Feeling contractions frequently with pit. Planning without epidural.   O:  BP 115/77   Pulse 86   Temp 98.1 F (36.7 C) (Oral)   Resp 18   Ht 5\' 6"  (1.676 m)   Wt 90.1 kg   LMP 02/26/2021 (Approximate)   SpO2 100%   BMI 32.05 kg/m  EFM: 145/mod/15x15/none  CVE: Dilation: 5 Effacement (%): 70 Station: -2 Presentation: Vertex Exam by:: Dr. [email or web address]   A&P: 31 y.o. 37 [redacted]w[redacted]d  #Labor: Slightly more effaced this check with head well applied. After verbal consent, performed AROM with moderate-large amount of clear fluid. Head remained well applied and tolerated well.  #Pain: PRN  #FWB: Cat I  #GBS negative  #cHTN w/ SIPE/SF: BP has been mild range. No symptoms currently. Labs WNL. Cont to monitor.   [redacted]w[redacted]d, DO 9:19 PM

## 2021-11-30 NOTE — Progress Notes (Signed)
Pt reports fetal movement, reports HA, dizziness, and blurred vision that started yesterday and reports BP of 148/92. Pt reports that she has been taking Tylenol and headache is still not better.

## 2021-11-30 NOTE — MAU Note (Signed)
Joy Hartman is a 31 y.o. at [redacted]w[redacted]d here in MAU reporting: sent from MD office for BP evaluation.  Reports currently has H/A and floaters.  Reports been taking Tylenol for last 2 days, no relief.  Reports last Tylenol was 0900 this morning.  Endorses +FM.  Denies VB or LOF. LMP: N/A Onset of complaint: today Pain score: 8/10 Vitals:   11/30/21 1204  BP: 132/83  Pulse: 92  Resp: 20  Temp: 98.4 F (36.9 C)  SpO2: 96%     FHT:151 bpm Lab orders placed from triage:   UA

## 2021-11-30 NOTE — Discharge Summary (Signed)
Postpartum Discharge Summary      Patient Name: Joy Hartman DOB: 09/26/1990 MRN: [id number]  Date of admission: 11/30/2021 Delivery date:11/30/2021  Delivering provider: Patriciaann Clan  Date of discharge: 12/02/2021  Admitting diagnosis: Supervision of high risk pregnancy, antepartum [O09.90] Intrauterine pregnancy: [redacted]w[redacted]d    Secondary diagnosis:  Principal Problem:   Hypertension in pregnancy, preeclampsia, severe, delivered Active Problems:   Supervision of high-risk pregnancy   History of eclampsia   Chronic hypertension affecting pregnancy   Supervision of high risk pregnancy, antepartum   Postpartum anemia due to acute blood loss  Additional problems: None    Discharge diagnosis: Term Pregnancy Delivered, Preeclampsia (severe), and CHTN with superimposed preeclampsia                                              Post partum procedures: Magnesium sulfate infusion Augmentation: AROM and Pitocin Complications: None  Hospital course: Induction of Labor With Vaginal Delivery   31y.o. yo [phone] [id number] admitted to the hospital 11/30/2021 for induction of labor.  Indication for induction:  cHTN with SIPE/severe features .  Patient had an uncomplicated labor course as follows: Membrane Rupture Time/Date: 9:09 PM ,11/30/2021   Delivery Method:Vaginal, Spontaneous  Episiotomy: None  Lacerations:  Labial;Perineal;1st degree  Details of delivery can be found in separate delivery note.  Patient received intrapartum and postpartum magnesium sulfate for eclampsia prophylaxis as per protocol. BPs were normal postpartum, did not require any medications but she was started on Lasix 20 mg po qd x 5 days, there were no further immediate complications.  Otherwise, patient had a routine postpartum course. She is ambulating, tolerating a regular diet, passing flatus, and urinating well. Patient is discharged home in stable condition on 12/02/2021, and will follow up in the office for BP check in  one week.  Newborn Data: Birth date:11/30/2021  Birth time:11:14 PM  Gender:Female  Living status:Living  Apgars:9 ,9  Weight:3250 g   Magnesium Sulfate received: Yes: Seizure prophylaxis BMZ received: No Rhophylac:No MMR:No T-DaP:Given prenatally Flu: No Transfusion:No  Physical exam  Vitals:   12/01/21 2300 12/01/21 2351 12/02/21 0526 12/02/21 0700  BP:  114/62 108/64 114/62  Pulse:  75 70 82  Resp: '18 17 16 16  ' Temp:  98.2 F (36.8 C) 98.1 F (36.7 C) 98.3 F (36.8 C)  TempSrc:   Oral Oral  SpO2:  100% 98% 99%  Weight:      Height:       General: alert, cooperative, and no distress Lochia: appropriate Uterine Fundus: firm Incision: N/A DVT Evaluation: No evidence of DVT seen on physical exam. Negative Homan's sign. No cords or calf tenderness. No significant calf/ankle edema. Labs: Lab Results  Component Value Date   WBC 10.9 (H) 11/30/2021   HGB 9.7 (L) 11/30/2021   HCT 28.4 (L) 11/30/2021   MCV 95.9 11/30/2021   PLT 166 11/30/2021      Latest Ref Rng & Units 11/30/2021   11:39 PM  CMP  Glucose 70 - 99 mg/dL 104   BUN 6 - 20 mg/dL 5   Creatinine 0.44 - 1.00 mg/dL 0.66   Sodium 135 - 145 mmol/L 135   Potassium 3.5 - 5.1 mmol/L 4.3   Chloride 98 - 111 mmol/L 106   CO2 22 - 32 mmol/L 21   Calcium 8.9 - 10.3 mg/dL 7.4  Total Protein 6.5 - 8.1 g/dL 6.1   Total Bilirubin 0.3 - 1.2 mg/dL 0.3   Alkaline Phos 38 - 126 U/L 88   AST 15 - 41 U/L 17   ALT 0 - 44 U/L 11    Edinburgh Score:    09/19/2018   11:11 AM  Edinburgh Postnatal Depression Scale Screening Tool  I have been able to laugh and see the funny side of things. 0  I have looked forward with enjoyment to things. 0  I have blamed myself unnecessarily when things went wrong. 0  I have been anxious or worried for no good reason. 0  I have felt scared or panicky for no good reason. 0  Things have been getting on top of me. 0  I have been so unhappy that I have had difficulty sleeping. 0  I  have felt sad or miserable. 0  I have been so unhappy that I have been crying. 0  The thought of harming myself has occurred to me. 0  Edinburgh Postnatal Depression Scale Total 0     After visit meds:  Allergies as of 12/02/2021       Reactions   Codeine Hives, Itching, Rash, Swelling   Hydrocodone Itching, Swelling   Itching and swelling        Medication List     STOP taking these medications    Bath these medications    acetaminophen 500 MG tablet Commonly known as: TYLENOL Take 1,000 mg by mouth every 6 (six) hours as needed.   aspirin EC 81 MG tablet Take 1 tablet (81 mg total) by mouth daily.   docusate sodium 100 MG capsule Commonly known as: COLACE Take 1 capsule (100 mg total) by mouth 2 (two) times daily as needed for mild constipation or moderate constipation.   ferrous sulfate 325 (65 FE) MG tablet Take 1 tablet (325 mg total) by mouth every other day.   furosemide 20 MG tablet Commonly known as: LASIX Take 1 tablet (20 mg total) by mouth daily for 4 days.   ibuprofen 600 MG tablet Commonly known as: ADVIL Take 1 tablet (600 mg total) by mouth every 6 (six) hours as needed for fever, headache, mild pain, moderate pain or cramping.   PrePLUS 27-1 MG Tabs Take 1 tablet by mouth daily.         Discharge home in stable condition Infant Feeding: Breast Infant Disposition:home with mother Discharge instruction: per After Visit Summary and Postpartum booklet. Activity: Advance as tolerated. Pelvic rest for 6 weeks.  Diet: routine diet Future Appointments  Date Time Provider [place]  12/07/2021 10:00 AM [place] None  12/21/2021  9:15 AM Griffin Basil, MD [facility] None  01/13/2022  9:55 AM Chancy Milroy, MD [place] None   Follow up Visit: Message sent to [place] by Dr Higinio Plan:  Please schedule this patient for a In person postpartum visit in 6 weeks with the following provider:  Any provider. Additional Postpartum F/U:BP check 1 week and MD ob/gyn interval BTL appointment in 1-2 weeks   High risk pregnancy complicated by:  cHTN with SIPE/SF  Delivery mode:  Vaginal, Spontaneous  Anticipated Birth Control:  Plans Interval BTL   12/02/2021 Verita Schneiders, MD

## 2021-11-30 NOTE — Progress Notes (Signed)
   PRENATAL VISIT NOTE  Subjective:  Joy Hartman is a 30 y.o. [phone] at [redacted]w[redacted]d being seen today for ongoing prenatal care.  She is currently monitored for the following issues for this high-risk pregnancy and has Asthma; HEADACHE; Panic attacks; Chronic nonintractable headache; Supervision of other normal pregnancy, antepartum; History of eclampsia; Carrier of spinal muscular atrophy; and Chronic hypertension affecting pregnancy on their problem list.  Patient doing well with no acute concerns today. She reports  mild blurred vision and intermittent headache .  Contractions: Irregular. Vag. Bleeding: None.  Movement: Present. Denies leaking of fluid.   The following portions of the patient's history were reviewed and updated as appropriate: allergies, current medications, past family history, past medical history, past social history, past surgical history and problem list. Problem list updated.  Objective:   Vitals:   11/30/21 1030  BP: (!) 138/92  Pulse: (!) 103  Weight: 196 lb 12.8 oz (89.3 kg)    Fetal Status: Fetal Heart Rate (bpm): 148 Fundal Height: 39 cm Movement: Present     General:  Alert, oriented and cooperative. Patient is in no acute distress.  Skin: Skin is warm and dry. No rash noted.   Cardiovascular: Normal heart rate noted  Respiratory: Normal respiratory effort, no problems with respiration noted  Abdomen: Soft, gravid, appropriate for gestational age.  Pain/Pressure: Present     Pelvic: Cervical exam performed Dilation: 3 Effacement (%): 60 Station: -3  Extremities: Normal range of motion.  Edema: Trace  Mental Status:  Normal mood and affect. Normal behavior. Normal judgment and thought content.   Assessment and Plan:  Pregnancy: [id number] at [redacted]w[redacted]d  1. Chronic hypertension affecting pregnancy Pt on no meds, was scheduled for IOL on 12/03/21, due to hx and current symptoms, low barrier for delivery at this time.  Pt sent to MAU for further evaluation and  possible early IOL  2. Supervision of other normal pregnancy, antepartum See above  3. History of eclampsia See above  4. Carrier of spinal muscular atrophy   5. [redacted] weeks gestation of pregnancy   Term labor symptoms and general obstetric precautions including but not limited to vaginal bleeding, contractions, leaking of fluid and fetal movement were reviewed in detail with the patient.  Please refer to After Visit Summary for other counseling recommendations.   No follow-ups on file.   Mariel Aloe, MD Faculty Attending Center for [facility]

## 2021-12-01 ENCOUNTER — Encounter (HOSPITAL_COMMUNITY): Payer: Self-pay | Admitting: Obstetrics & Gynecology

## 2021-12-01 ENCOUNTER — Telehealth: Payer: Self-pay

## 2021-12-01 DIAGNOSIS — O1410 Severe pre-eclampsia, unspecified trimester: Secondary | ICD-10-CM

## 2021-12-01 DIAGNOSIS — O1414 Severe pre-eclampsia complicating childbirth: Secondary | ICD-10-CM | POA: Diagnosis present

## 2021-12-01 DIAGNOSIS — D62 Acute posthemorrhagic anemia: Secondary | ICD-10-CM | POA: Diagnosis not present

## 2021-12-01 LAB — CBC
HCT: 28.4 % — ABNORMAL LOW (ref 36.0–46.0)
Hemoglobin: 9.7 g/dL — ABNORMAL LOW (ref 12.0–15.0)
MCH: 32.8 pg (ref 26.0–34.0)
MCHC: 34.2 g/dL (ref 30.0–36.0)
MCV: 95.9 fL (ref 80.0–100.0)
Platelets: 166 10*3/uL (ref 150–400)
RBC: 2.96 MIL/uL — ABNORMAL LOW (ref 3.87–5.11)
RDW: 12.7 % (ref 11.5–15.5)
WBC: 10.9 10*3/uL — ABNORMAL HIGH (ref 4.0–10.5)
nRBC: 0 % (ref 0.0–0.2)

## 2021-12-01 LAB — COMPREHENSIVE METABOLIC PANEL
ALT: 11 U/L (ref 0–44)
AST: 17 U/L (ref 15–41)
Albumin: 2.6 g/dL — ABNORMAL LOW (ref 3.5–5.0)
Alkaline Phosphatase: 88 U/L (ref 38–126)
Anion gap: 8 (ref 5–15)
BUN: 5 mg/dL — ABNORMAL LOW (ref 6–20)
CO2: 21 mmol/L — ABNORMAL LOW (ref 22–32)
Calcium: 7.4 mg/dL — ABNORMAL LOW (ref 8.9–10.3)
Chloride: 106 mmol/L (ref 98–111)
Creatinine, Ser: 0.66 mg/dL (ref 0.44–1.00)
GFR, Estimated: >60 mL/min (ref >60)
Glucose, Bld: 104 mg/dL — ABNORMAL HIGH (ref 70–99)
Potassium: 4.3 mmol/L (ref 3.5–5.1)
Sodium: 135 mmol/L (ref 135–145)
Total Bilirubin: 0.3 mg/dL (ref 0.3–1.2)
Total Protein: 6.1 g/dL — ABNORMAL LOW (ref 6.5–8.1)

## 2021-12-01 LAB — MAGNESIUM: Magnesium: 4.8 mg/dL — ABNORMAL HIGH (ref 1.7–2.4)

## 2021-12-01 LAB — RPR: RPR Ser Ql: NONREACTIVE

## 2021-12-01 MED ORDER — PRENATAL MULTIVITAMIN CH
1.0000 | ORAL_TABLET | Freq: Every day | ORAL | Status: DC
  Administered 2021-12-01: 1 via ORAL
  Filled 2021-12-01: qty 1

## 2021-12-01 MED ORDER — ONDANSETRON HCL 4 MG/2ML IJ SOLN: 4.0000 mg | INTRAMUSCULAR | Status: DC | PRN

## 2021-12-01 MED ORDER — MEASLES, MUMPS & RUBELLA VAC IJ SOLR: 0.5000 mL | Freq: Once | INTRAMUSCULAR | Status: DC

## 2021-12-01 MED ORDER — SODIUM CHLORIDE 0.9% FLUSH: 3.0000 mL | Freq: Two times a day (BID) | INTRAVENOUS | Status: DC

## 2021-12-01 MED ORDER — TETANUS-DIPHTH-ACELL PERTUSSIS 5-2.5-18.5 LF-MCG/0.5 IM SUSY: 0.5000 mL | PREFILLED_SYRINGE | Freq: Once | INTRAMUSCULAR | Status: DC

## 2021-12-01 MED ORDER — ZOLPIDEM TARTRATE 5 MG PO TABS: 5.0000 mg | ORAL_TABLET | Freq: Every evening | ORAL | Status: DC | PRN

## 2021-12-01 MED ORDER — FUROSEMIDE 20 MG PO TABS
20.0000 mg | ORAL_TABLET | Freq: Every day | ORAL | Status: DC
  Administered 2021-12-01 – 2021-12-02 (×2): 20 mg via ORAL
  Filled 2021-12-01 (×2): qty 1

## 2021-12-01 MED ORDER — SODIUM CHLORIDE 0.9 % IV SOLN: INTRAVENOUS | Status: DC | PRN

## 2021-12-01 MED ORDER — DIPHENHYDRAMINE HCL 25 MG PO CAPS: 25.0000 mg | ORAL_CAPSULE | Freq: Four times a day (QID) | ORAL | Status: DC | PRN

## 2021-12-01 MED ORDER — SENNOSIDES-DOCUSATE SODIUM 8.6-50 MG PO TABS
2.0000 | ORAL_TABLET | ORAL | Status: DC
  Administered 2021-12-01: 2 via ORAL
  Filled 2021-12-01 (×2): qty 2

## 2021-12-01 MED ORDER — SIMETHICONE 80 MG PO CHEW: 80.0000 mg | CHEWABLE_TABLET | ORAL | Status: DC | PRN

## 2021-12-01 MED ORDER — ONDANSETRON HCL 4 MG PO TABS: 4.0000 mg | ORAL_TABLET | ORAL | Status: DC | PRN

## 2021-12-01 MED ORDER — COCONUT OIL OIL
1.0000 | TOPICAL_OIL | Status: DC | PRN
  Administered 2021-12-01: 1 via TOPICAL

## 2021-12-01 MED ORDER — DIBUCAINE (PERIANAL) 1 % EX OINT: 1.0000 | TOPICAL_OINTMENT | CUTANEOUS | Status: DC | PRN

## 2021-12-01 MED ORDER — IBUPROFEN 600 MG PO TABS
600.0000 mg | ORAL_TABLET | Freq: Four times a day (QID) | ORAL | Status: DC
  Administered 2021-12-01 – 2021-12-02 (×6): 600 mg via ORAL
  Filled 2021-12-01 (×6): qty 1

## 2021-12-01 MED ORDER — FERROUS GLUCONATE 324 (38 FE) MG PO TABS
324.0000 mg | ORAL_TABLET | Freq: Every day | ORAL | Status: DC
  Administered 2021-12-02: 324 mg via ORAL
  Filled 2021-12-01: qty 1

## 2021-12-01 MED ORDER — MAGNESIUM SULFATE 40 GM/1000ML IV SOLN
2.0000 g/h | INTRAVENOUS | Status: AC
  Administered 2021-12-01: 2 g/h via INTRAVENOUS
  Filled 2021-12-01: qty 1000

## 2021-12-01 MED ORDER — BENZOCAINE-MENTHOL 20-0.5 % EX AERO
1.0000 | INHALATION_SPRAY | CUTANEOUS | Status: DC | PRN
Start: 2021-12-01 — End: 2021-12-02

## 2021-12-01 MED ORDER — SODIUM CHLORIDE 0.9% FLUSH
3.0000 mL | INTRAVENOUS | Status: DC | PRN
Start: 2021-12-01 — End: 2021-12-02

## 2021-12-01 MED ORDER — WITCH HAZEL-GLYCERIN EX PADS: 1.0000 | MEDICATED_PAD | CUTANEOUS | Status: DC | PRN

## 2021-12-01 MED ORDER — ACETAMINOPHEN 325 MG PO TABS: 650.0000 mg | ORAL_TABLET | ORAL | Status: DC | PRN

## 2021-12-01 NOTE — Lactation Note (Signed)
This note was copied from a baby's chart. Lactation Consultation Note Experienced BF mom had the baby all ready on the breast when LC came in rm. Had good body alignment and good latch.  Praised mom. Mom doesn't have any questions at this time. Mom will be f/u on OBSC. Mom is on Mag.  Patient Name: Girl Ritu Gagliardo [id number] Date: 12/01/2021 Reason for consult: L&D Initial assessment;Term Age:30 hours  Maternal Data Does the patient have breastfeeding experience prior to this delivery?: Yes How long did the patient breastfeed?: 3 months to first child stopped d/t breast infection.  1 yr that she just stopped BF in January to her 30 yr old.  Feeding    LATCH Score Latch: Grasps breast easily, tongue down, lips flanged, rhythmical sucking.  Audible Swallowing: A few with stimulation  Type of Nipple: Everted at rest and after stimulation  Comfort (Breast/Nipple): Soft / non-tender  Hold (Positioning): No assistance needed to correctly position infant at breast.  LATCH Score: 9   Lactation Tools Discussed/Used    Interventions Interventions: Skin to skin  Discharge    Consult Status Consult Status: Follow-up from L&D Date: 12/01/21 Follow-up type: In-patient    Celestia Duva, Diamond Nickel 12/01/2021, 12:16 AM

## 2021-12-01 NOTE — Progress Notes (Signed)
Post Partum Day 1 s/p VD at [redacted]w[redacted]d after IOL for CHTN with severe SIPE, history of eclampsia.  Subjective: No complaints, up ad lib, voiding, tolerating PO, and + flatus. Patient denies any headaches, visual symptoms, RUQ/epigastric pain or other concerning symptoms. Baby is stable at bedside, breastfeeding. Normal lochia reported.  Objective: Blood pressure 113/63, pulse 80, temperature 98 F (36.7 C), temperature source Oral, resp. rate 16, height 5\' 6"  (1.676 m), weight 90.1 kg, last menstrual period 02/26/2021, SpO2 100 %, unknown if currently breastfeeding.  Physical Exam:  General: alert and no distress Lochia: appropriate Uterine Fundus: firm DVT Evaluation: No evidence of DVT seen on physical exam. Negative Homan's sign. No cords or calf tenderness. No significant calf/ankle edema.  Recent Labs    11/30/21 1243 11/30/21 2339  HGB 10.3* 9.7*  HCT 29.9* 28.4*    Assessment/Plan: No need for antihypertensive currently, will continue to monitor BP control, already on Lasix.   Magnesium sulfate infusion x 24 hours PP  Oral iron ordered for mild acute on chronic anemia secondary to delivery blood loss. Breast feeding, desires Depo Provera at discharge and possible interval BTL Likely discharge tomorrow if stable.   LOS: 1 day   01/31/22, MD 12/01/2021, 9:08 AM

## 2021-12-01 NOTE — Telephone Encounter (Signed)
Breast pump order received from [employer].  Order signed and faxed back.  Confirmation received.

## 2021-12-01 NOTE — Lactation Note (Signed)
This note was copied from a baby's chart. Lactation Consultation Note  Patient Name: Girl Arletta Lumadue AVWUJ'W Date: 12/01/2021 Reason for consult: Follow-up assessment;Mother's request;Term;Breastfeeding assistance Age:30 hours Mom recent feeding for 10 mins prior to [facility] arrival.  Mom using 24 flanges, LC provided coconut oil to line flange before pumping.  Mom on Mg and Lasix.   Plan 1. To feed based on cues 8-12x 24hr period. Mom to offer breasts and look for signs of milk transfer.  2. Mom to supplement with EBM via hand expression via spoon or pumping with pace bottle feeding and slow flow nipple. BF supplementation guide provided.  3. Post pump after feeding for 15 mins.  All questions answered at the end of the visit.  Maternal Data Has patient been taught Hand Expression?: Yes  Feeding Mother's Current Feeding Choice: Breast Milk  LATCH Score                    Lactation Tools Discussed/Used Tools: Pump;Flanges;Coconut oil Flange Size: 24;27 Breast pump type: Double-Electric Breast Pump Pump Education: Setup, frequency, and cleaning;Milk Storage Reason for Pumping: increase stimulation Pumping frequency: post pump after each feeding for 15 mins  Interventions Interventions: Breast feeding basics reviewed;Hand express;Expressed milk;Coconut oil;DEBP;Education;Pace feeding;LC Services brochure;Infant Driven Feeding Algorithm education  Discharge Pump: Personal WIC Program: Yes  Consult Status Consult Status: Follow-up Date: 12/02/21 Follow-up type: In-patient    Real Cona  Nicholson-Springer 12/01/2021, 11:54 AM

## 2021-12-02 ENCOUNTER — Other Ambulatory Visit (HOSPITAL_COMMUNITY): Payer: Self-pay

## 2021-12-02 MED ORDER — FUROSEMIDE 20 MG PO TABS
20.0000 mg | ORAL_TABLET | Freq: Every day | ORAL | 0 refills | Status: DC
  Filled 2021-12-02: qty 4, 4d supply, fill #0

## 2021-12-02 MED ORDER — MEDROXYPROGESTERONE ACETATE 150 MG/ML IM SUSP
150.0000 mg | Freq: Once | INTRAMUSCULAR | Status: AC
Start: 2021-12-02 — End: 2021-12-02
  Administered 2021-12-02: 150 mg via INTRAMUSCULAR
  Filled 2021-12-02: qty 1

## 2021-12-02 MED ORDER — DOCUSATE SODIUM 100 MG PO CAPS
100.0000 mg | ORAL_CAPSULE | Freq: Two times a day (BID) | ORAL | 2 refills | Status: DC | PRN
  Filled 2021-12-02: qty 30, 15d supply, fill #0

## 2021-12-02 MED ORDER — IBUPROFEN 600 MG PO TABS
600.0000 mg | ORAL_TABLET | Freq: Four times a day (QID) | ORAL | 2 refills | Status: DC | PRN
Start: 2021-12-02 — End: 2022-03-07
  Filled 2021-12-02: qty 60, 15d supply, fill #0

## 2021-12-02 MED ORDER — DOCUSATE SODIUM 100 MG PO CAPS: 100.0000 mg | ORAL_CAPSULE | Freq: Two times a day (BID) | ORAL | 2 refills | Status: DC | PRN

## 2021-12-02 MED ORDER — IBUPROFEN 600 MG PO TABS: 600.0000 mg | ORAL_TABLET | Freq: Four times a day (QID) | ORAL | 2 refills | Status: DC | PRN

## 2021-12-02 MED ORDER — FUROSEMIDE 20 MG PO TABS: 20.0000 mg | ORAL_TABLET | Freq: Every day | ORAL | 0 refills | Status: DC

## 2021-12-02 NOTE — Clinical Social Work Maternal (Signed)
CLINICAL SOCIAL WORK MATERNAL/CHILD NOTE  Patient Details  Name: Joy Hartman MRN: [id number] Date of Birth: 1990/09/22  Date:  12/02/2021  Clinical Social Worker Initiating Note:  Abundio Miu, [place] Date/Time: Initiated:  12/02/21/1139     Child's Name:  Joy Hartman   Biological Parents:  Mother, Father (Father: Joy Hartman 06/07/89)   Need for Interpreter:  None   Reason for Referral:  Current Substance Use/Substance Use During Pregnancy     Address:  [address] [place] [address]    Phone number:  [phone] (home)     Additional phone number:   Household Members/Support Persons (HM/SP):       HM/SP Name Relationship DOB or Age  HM/SP -1  Joy Hartman son 03/13/12  HM/SP -2 Joy Hartman son 09/25/20  HM/SP -3        HM/SP -4        HM/SP -5        HM/SP -6        HM/SP -7        HM/SP -8          Natural Supports (not living in the home):  Immediate Family, Spouse/significant other, Parent   Professional Supports: None   Employment: Unemployed   Type of Work:     Education:  Programmer, systems   Homebound arranged:    Museum/gallery curator Resources:  Medicaid   Other Resources:  Physicist, medical  , [place] Considerations Which May Impact Care:    Strengths:  Ability to meet basic needs  , Home prepared for child  , Pediatrician chosen   Psychotropic Medications:         Pediatrician:    [place]  Pediatrician List:   [place]      Pediatrician Fax Number:    Risk Factors/Current Problems:  Substance Use     Cognitive State:  Alert  , Able to Concentrate  , Linear Thinking  , Goal Oriented     Mood/Affect:  Comfortable  , Calm  , Relaxed  , Happy     CSW Assessment: CSW met with [name] at bedside to complete psychosocial assessment. [name]'s sister was present and going into the restroom. [name] granted  CSW verbal permission to speak in front of her sister about anything. CSW introduced self and explained reason for consult. [name] was welcoming, pleasant, and remained engaged during assessment. [name] reported that she resides with her two sons. [name] reported that she receives both [facility] and food stamps. [name] reported that they have all items needed to care for infant including a car seat and basinet. CSW inquired about [name]'s support system, [name] reported that her sister, mom, dad, and FOB are supports.   CSW inquired about [name]'s mental health history. [name] denied any mental health history. [name] denied any history of postpartum depression. CSW inquired about how [name] was feeling emotionally since giving birth, [name] reported that she was feeling good and ready to go home. CSW acknowledged and validated [name]'s feelings. [name] presented calm and did not demonstrate any acute mental health signs/symptoms. CSW assessed for safety, [name] denied SI, HI, and domestic violence.   CSW provided education regarding the baby blues period vs. perinatal mood disorders, discussed treatment and gave resources for mental health follow up if concerns arise.  CSW recommends self-evaluation during  the postpartum time period using the New Mom Checklist from Postpartum Progress and encouraged [name] to contact a medical professional if symptoms are noted at any time.    CSW provided review of Sudden Infant Death Syndrome (SIDS) precautions.   CSW informed [name] about the hospital drug screen policy due to documented substance use during pregnancy. [name] confirmed marijuana use and reported last use as almost two months ago. [name] denied any additional substance use. CSW informed [name] that infant's UDS was negative and CDS would continue to be monitored and a CPS report would be made if warranted. [name] verbalized understanding and denied any CPS history.    CSW identifies no further need for intervention and no barriers to discharge at this time.   CSW  Plan/Description:  Sudden Infant Death Syndrome (SIDS) Education, Perinatal Mood and Anxiety Disorder (PMADs) Education, No Further Intervention Required/No Barriers to Discharge, [place], CSW Will Continue to Monitor Umbilical Cord Tissue Drug Screen Results and Make Report if Barbette Or, LCSW 12/02/2021, 11:40 AM

## 2021-12-02 NOTE — Lactation Note (Signed)
This note was copied from a baby's chart. Lactation Consultation Note  Patient Name: Joy Hartman HQION'G Date: 12/02/2021 Reason for consult: Follow-up assessment Age:30 hours  P3, Mother is doubting her milk supply due to cluster feeding. Provided education and reassurance. Feed on demand with cues.  Goal 8-12+ times per day after first 24 hrs.  Reviewed engorgement care and monitoring voids/stools.   Feeding Mother's Current Feeding Choice: Breast Milk and Formula  Interventions Interventions: Education  Discharge Discharge Education: Engorgement and breast care;Warning signs for feeding baby  Consult Status Consult Status: Complete Date: 12/02/21    [name] [facility] 12/02/2021, 11:42 AM

## 2021-12-03 ENCOUNTER — Inpatient Hospital Stay (HOSPITAL_COMMUNITY): Admit: 2021-12-03 | Payer: Medicaid Other | Admitting: Family Medicine

## 2021-12-07 ENCOUNTER — Ambulatory Visit: Payer: Medicaid Other

## 2021-12-07 ENCOUNTER — Encounter: Payer: Medicaid Other | Admitting: Obstetrics and Gynecology

## 2021-12-08 ENCOUNTER — Telehealth (HOSPITAL_COMMUNITY): Payer: Self-pay | Admitting: *Deleted

## 2021-12-08 NOTE — Telephone Encounter (Signed)
Mom reports feeling good. No concerns about herself at this time. EPDS=4 [facility] score=0) Mom reports baby is doing well. Feeding, peeing, and pooping without difficulty. Safe sleep reviewed. Mom reports no concerns about baby at present.  Duffy Rhody, RN 12-08-2021 at 2:14pm

## 2021-12-21 ENCOUNTER — Ambulatory Visit: Payer: Medicaid Other | Admitting: Obstetrics and Gynecology

## 2022-01-13 ENCOUNTER — Other Ambulatory Visit (HOSPITAL_COMMUNITY)
Admission: RE | Admit: 2022-01-13 | Discharge: 2022-01-13 | Disposition: A | Discharge status: Home / Self Care | Payer: Medicaid Other | Source: Ambulatory Visit | Attending: Obstetrics and Gynecology | Admitting: Obstetrics and Gynecology

## 2022-01-13 ENCOUNTER — Ambulatory Visit (INDEPENDENT_AMBULATORY_CARE_PROVIDER_SITE_OTHER): Payer: Medicaid Other | Admitting: Obstetrics and Gynecology

## 2022-01-13 ENCOUNTER — Encounter: Payer: Self-pay | Admitting: Obstetrics and Gynecology

## 2022-01-13 DIAGNOSIS — Z3009 Encounter for other general counseling and advice on contraception: Secondary | ICD-10-CM

## 2022-01-13 DIAGNOSIS — R87613 High grade squamous intraepithelial lesion on cytologic smear of cervix (HGSIL): Secondary | ICD-10-CM

## 2022-01-13 NOTE — Progress Notes (Signed)
Post Partum Visit Note  Joy Hartman is a 31 y.o. [phone] female who presents for a postpartum visit. She is 6 weeks postpartum following a normal spontaneous vaginal delivery.  I have fully reviewed the prenatal and intrapartum course. The delivery was at [redacted]w[redacted]d gestational weeks.  Anesthesia: none. Postpartum course has been unremarkable. Baby is doing well. Baby is feeding by both breast and bottle - Similac 360 . Bleeding no bleeding. Bowel function is normal. Bladder function is normal. Patient is not sexually active. Contraception method is Depo-Provera injections. Postpartum depression screening: negative.   The pregnancy intention screening data noted above was reviewed. Potential methods of contraception were discussed. The patient elected to proceed with No data recorded.    Health Maintenance Due  Topic Date Due   COVID-19 Vaccine (1) Never done   FOOT EXAM  Never done   OPHTHALMOLOGY EXAM  Never done   URINE MICROALBUMIN  Never done   INFLUENZA VACCINE  12/27/2021    Medical Records  Review of Systems Pertinent items noted in HPI and remainder of comprehensive ROS otherwise negative.  Objective:  Wt 182 lb (82.6 kg)   LMP 01/06/2022 (Approximate)   Breastfeeding Yes   BMI 29.38 kg/m    General:  alert   Breasts:  not indicated  Lungs: clear to auscultation bilaterally  Heart:  regular rate and rhythm, S1, S2 normal, no murmur, click, rub or gallop  Abdomen: soft, non-tender; bowel sounds normal; no masses,  no organomegaly   Wound NA  GU exam:  normal       Assessment:    There are no diagnoses linked to this encounter.  NL postpartum exam.  Abnormal pap Unwanted fertility  Plan:   Essential components of care per ACOG recommendations:  1.  Mood and well being: Patient with negative depression screening today. Reviewed local resources for support.  - Patient tobacco use? No.   - hx of drug use? No  2. Infant care and feeding:  -Patient  currently breastmilk feeding? Yes -Social determinants of health (SDOH) reviewed in EPIC. No concerns  3. Sexuality, contraception and birth spacing - Patient does not want a pregnancy in the next year.  Desired family size is completed  - Reviewed reproductive life planning. Reviewed contraceptive methods based on pt preferences and effectiveness.  Patient desired Female Sterilization. BTL papers signed today Received Depo Provera prior to hospital discharge  Patient desires bilateral tubal sterilization.  Other reversible forms of contraception were discussed with patient; she declines all other modalities. Discussed bilateral tubal sterilization in detail; discussed options of laparoscopic bilateral tubal sterilization using Filshie clips vs laparoscopic bilateral salpingectomy. Risks and benefits discussed in detail including but not limited to: risk of regret, permanence of method, bleeding, infection, injury to surrounding organs and need for additional procedures.  Failure risk of 1-2 % for Filshie clips and <1% for bilateral salpingectomy with increased risk of ectopic gestation if pregnancy occurs was also discussed with patient.  Also discussed possible reduction of risk of ovarian cancer via bilateral salpingectomy given that a growing body of knowledge reveals that the majority of cases of high grade serous "ovarian" cancer actually are actually  cancers arising from the fimbriated end of the fallopian tubes. Emphasized that removal of fallopian tubes do not result in any known hormonal imbalance.  Patient verbalized understanding of these risks and benefits and wants to proceed with sterilization with laparoscopic bilateral salpingectomy  She was told that she will be contacted  by our surgical scheduler regarding the time and date of her surgery; routine preoperative instructions of having nothing to eat or drink after midnight on the day prior to surgery and also coming to the hospital 1 1/2  hours prior to her time of surgery were also emphasized.  She was told she may be called for a preoperative appointment about a week prior to surgery and will be given further preoperative instructions at that visit.  Routine postoperative instructions will be reviewed with the patient and her family in detail after surgery. Printed patient education handouts about the procedure was given to the patient to review at home.  Medicaid papers have been signed , patient understands that surgery will be scheduled at least 30 days after the day papers are signed as per Medicaid guidelines.  In the meantime, patient will use Depo Provera for contraception prior to surgery.     4. Sleep and fatigue -Encouraged family/partner/community support of 4 hrs of uninterrupted sleep to help with mood and fatigue  5. Physical Recovery  - Discussed patients delivery and complications. She describes her labor as good. - Patient had a Vaginal, no problems at delivery. Patient had a 1st degree laceration. Perineal healing reviewed. Patient expressed understanding - Patient has urinary incontinence? No. - Patient is safe to resume physical and sexual activity  6.  Health Maintenance - HM due items addressed Yes - Last pap smear  Diagnosis  Date Value Ref Range Status  09/21/2021 (A)  Final   - High grade squamous intraepithelial lesion (HSIL)   Pap smear done at today's visit.  -Breast Cancer screening indicated? No.   7. Chronic Disease/Pregnancy Condition follow up: None  F/U with Post op appt  Nettie Elm, MD [facility] for [facility], [facility]

## 2022-01-13 NOTE — Patient Instructions (Addendum)
Laparoscopic Tubal Ligation Laparoscopic tubal ligation is a procedure to close the fallopian tubes. This is done to prevent pregnancy. When the fallopian tubes are closed, the eggs that your ovaries release cannot enter the uterus, and sperm cannot reach the released eggs. You should not have this procedure if you want to get pregnant someday or if you are unsure about having more children. Tell a health care provider about: Any allergies you have. All medicines you are taking, including vitamins, herbs, eye drops, creams, and over-the-counter medicines. Any problems you or family members have had with anesthetic medicines. Any blood disorders you have. Any surgeries you have had. Any medical conditions you have. Whether you are pregnant or may be pregnant. Any past pregnancies. What are the risks? Generally, this is a safe procedure. However, problems may occur, including: Infection. Bleeding. Injury to other organs in the abdomen. Side effects from anesthetic medicines. Failure of the procedure. This procedure can increase your risk of an ectopic pregnancy. This is a pregnancy in which a fertilized egg attaches to the outside of the uterus. What happens before the procedure? Staying hydrated Follow instructions from your health care provider about hydration, which may include: Up to 2 hours before the procedure - you may continue to drink clear liquids, such as water, clear fruit juice, black coffee, and plain tea. Eating and drinking restrictions Follow instructions from your health care provider about eating and drinking, which may include: 8 hours before the procedure - stop eating heavy meals or foods, such as meat, fried foods, or fatty foods. 6 hours before the procedure - stop eating light meals or foods, such as toast or cereal. 6 hours before the procedure - stop drinking milk or drinks that contain milk. 2 hours before the procedure - stop drinking clear  liquids. Medicines Ask your health care provider about: Changing or stopping your regular medicines. This is especially important if you are taking diabetes medicines or blood thinners. Taking medicines such as aspirin and ibuprofen. These medicines can thin your blood. Do not take these medicines unless your health care provider tells you to take them. Taking over-the-counter medicines, vitamins, herbs, and supplements. Surgery safety Ask your health care provider: How your surgery site will be marked. What steps will be taken to help prevent infection. These steps may include: Removing hair at the surgery site. Washing skin with a germ-killing soap. Taking antibiotic medicine. General instructions Do not use any products that contain nicotine or tobacco for at least 4 weeks before the procedure. These products include cigarettes, chewing tobacco, and vaping devices, such as e-cigarettes. If you need help quitting, ask your health care provider. Plan to have someone take you home from the hospital. If you will be going home right after the procedure, plan to have a responsible adult care for you for the time you are told. This is important. What happens during the procedure?     An IV will be inserted into one of your veins. You will be given one or more of the following: A medicine to help you relax (sedative). A medicine to numb the area (local anesthetic). A medicine to make you fall asleep (general anesthetic). A medicine that is injected into an area of your body to numb everything below the injection site (regional anesthetic). Your bladder may be emptied with a small tube (catheter). If you have been given a general anesthetic, a tube will be put down your throat to help you breathe. Two small incisions will   be made in your lower abdomen and near your belly button. Your abdomen will be inflated with a gas. This will let the surgeon see better and will give the surgeon room to  work. A lighted tube with camera (laparoscope) will be inserted into your abdomen through one of the incisions. Small instruments will be inserted through the other incision. The fallopian tubes will be tied off, burned (cauterized), or blocked with a clip, ring, or clamp. A small portion in the center of each fallopian tube may be removed. The gas will be released from the abdomen. The incisions will be closed with stitches (sutures). A bandage (dressing) will be placed over the incisions. The procedure may vary among health care providers and hospitals. What happens after the procedure? Your blood pressure, heart rate, breathing rate, and blood oxygen level will be monitored until you leave the hospital. You will be given medicine to help with pain, nausea, and vomiting as needed. You may have vaginal discharge after the procedure. You may need to wear a sanitary napkin. If you were given a sedative during the procedure, it can affect you for several hours. Do not drive or operate machinery until your health care provider says that it is safe. Summary Laparoscopic tubal ligation is a procedure that is done to prevent pregnancy. You should not have this procedure if you want to get pregnant someday or if you are unsure about having more children. The procedure is done using a thin, lighted tube (laparoscope) with a camera attached that will be inserted into your abdomen through an incision. After the procedure you will be given medicine to help with pain, nausea, and vomiting as needed. Plan to have someone take you home from the hospital. This information is not intended to replace advice given to you by your health care provider. Make sure you discuss any questions you have with your health care provider. Document Revised: 01/30/2020 Document Reviewed: 01/30/2020 Elsevier Patient Education  [place]. Laparoscopic Tubal Ligation, Care After The following information offers guidance  on how to care for yourself after your procedure. Your health care provider may also give you more specific instructions. If you have problems or questions, contact your health care provider. What can I expect after the procedure? After the procedure, it is common to have: A sore throat if general anesthesia was used. Pain in shoulders, back, and abdomen. This is caused by the gas that was used during the procedure. Mild discomfort or cramping in your abdomen. Pain or soreness in the area where the surgical incision was made. A bloated feeling. Tiredness. Nausea and vomiting. Follow these instructions at home: Medicines Take over-the-counter and prescription medicines only as told by your health care provider. Ask your health care provider if the medicine prescribed to you: Requires you to avoid driving or using heavy machinery. Can cause constipation. You may need to take these actions to prevent or treat constipation: Drink enough fluid to keep your urine pale yellow. Take over-the-counter or prescription medicines. Eat foods that are high in fiber, such as beans, whole grains, and fresh fruits and vegetables. Limit foods that are high in fat and processed sugars, such as fried or sweet foods. Do not take aspirin because it can cause bleeding. Incision care     Follow instructions from your health care provider about how to take care of your incision. Make sure you: Wash your hands with soap and water for at least 20 seconds before and after you change  your bandage (dressing). If soap and water are not available, use hand sanitizer. Change your dressing as told by your health care provider. Leave stitches (sutures), skin glue, or adhesive strips in place. These skin closures may need to stay in place for 2 weeks or longer. If adhesive strip edges start to loosen and curl up, you may trim the loose edges. Do not remove adhesive strips completely unless your health care provider tells you  to do that. Check your incision area every day for signs of infection. Check for: Redness, swelling, or more pain. Fluid or blood. Warmth. Pus or a bad smell. Activity Rest as told by your health care provider. Avoid sitting for a long time without moving. Get up to take short walks every 1-2 hours. This is important to improve blood flow and breathing. Ask for help if you feel weak or unsteady. Do not have sex, douche, or put a tampon or anything else in your vagina for 6 weeks or as long as told by your health care provider. Do not lift anything that is heavier than your baby for 2 weeks, or the limit that you are told, until your health care provider says that it is safe. Do not take baths, swim, or use a hot tub until your health care provider approves. Ask your health care provider if you may take showers. You may only be allowed to take sponge baths. Return to your normal activities as told by your health care provider. Ask your health care provider what activities are safe for you. General instructions After the procedure you may need to wear a sanitary pad for vaginal discharge. Have someone help you with your daily household tasks for the first few days. Keep all follow-up visits. This is important. Contact a health care provider if: You have redness, swelling, or more pain around your incision. Your incision feels warm to the touch. You have pus or a bad smell coming from your incision. The edges of your incision break open after the sutures have been removed. Your pain does not improve after 2-3 days. You have a rash. You repeatedly become dizzy or light-headed. Your pain medicine is not helping. Get help right away if: You have a fever or chills. You faint. You have increasing pain in your abdomen. You have severe pain in one or both of your shoulders. You have fluid or blood coming from your sutures or heavy bleeding from your vagina. You have shortness of breath or  difficulty breathing. You have chest pain, leg pain, or leg swelling. You have ongoing nausea, vomiting, or diarrhea. These symptoms may represent a serious problem that is an emergency. Do not wait to see if the symptoms will go away. Get medical help right away. Call your local emergency services (911 in the [place] Do not drive yourself to the hospital. Summary After the procedure, it is common to have mild discomfort or cramping in your abdomen. After the procedure you may need to wear a sanitary pad for vaginal discharge. Take over-the-counter and prescription medicines only as told by your health care provider. Watch for symptoms that should prompt you to call your health care provider. Keep all follow-up visits. This is important. This information is not intended to replace advice given to you by your health care provider. Make sure you discuss any questions you have with your health care provider. Document Revised: 01/30/2020 Document Reviewed: 01/30/2020 Elsevier Patient Education  [place] Maintenance, Female Adopting a healthy lifestyle  and getting preventive care are important in promoting health and wellness. Ask your health care provider about: The right schedule for you to have regular tests and exams. Things you can do on your own to prevent diseases and keep yourself healthy. What should I know about diet, weight, and exercise? Eat a healthy diet  Eat a diet that includes plenty of vegetables, fruits, low-fat dairy products, and lean protein. Do not eat a lot of foods that are high in solid fats, added sugars, or sodium. Maintain a healthy weight Body mass index (BMI) is used to identify weight problems. It estimates body fat based on height and weight. Your health care provider can help determine your BMI and help you achieve or maintain a healthy weight. Get regular exercise Get regular exercise. This is one of the most important things you can do for your  health. Most adults should: Exercise for at least 150 minutes each week. The exercise should increase your heart rate and make you sweat (moderate-intensity exercise). Do strengthening exercises at least twice a week. This is in addition to the moderate-intensity exercise. Spend less time sitting. Even light physical activity can be beneficial. Watch cholesterol and blood lipids Have your blood tested for lipids and cholesterol at 30 years of age, then have this test every 5 years. Have your cholesterol levels checked more often if: Your lipid or cholesterol levels are high. You are older than 30 years of age. You are at high risk for heart disease. What should I know about cancer screening? Depending on your health history and family history, you may need to have cancer screening at various ages. This may include screening for: Breast cancer. Cervical cancer. Colorectal cancer. Skin cancer. Lung cancer. What should I know about heart disease, diabetes, and high blood pressure? Blood pressure and heart disease High blood pressure causes heart disease and increases the risk of stroke. This is more likely to develop in people who have high blood pressure readings or are overweight. Have your blood pressure checked: Every 3-5 years if you are 64-79 years of age. Every year if you are 44 years old or older. Diabetes Have regular diabetes screenings. This checks your fasting blood sugar level. Have the screening done: Once every three years after age 14 if you are at a normal weight and have a low risk for diabetes. More often and at a younger age if you are overweight or have a high risk for diabetes. What should I know about preventing infection? Hepatitis B If you have a higher risk for hepatitis B, you should be screened for this virus. Talk with your health care provider to find out if you are at risk for hepatitis B infection. Hepatitis C Testing is recommended for: Everyone born  from [birth year] through 1964. Anyone with known risk factors for hepatitis C. Sexually transmitted infections (STIs) Get screened for STIs, including gonorrhea and chlamydia, if: You are sexually active and are younger than 30 years of age. You are older than 30 years of age and your health care provider tells you that you are at risk for this type of infection. Your sexual activity has changed since you were last screened, and you are at increased risk for chlamydia or gonorrhea. Ask your health care provider if you are at risk. Ask your health care provider about whether you are at high risk for HIV. Your health care provider may recommend a prescription medicine to help prevent HIV infection. If you choose to take  medicine to prevent HIV, you should first get tested for HIV. You should then be tested every 3 months for as long as you are taking the medicine. Pregnancy If you are about to stop having your period (premenopausal) and you may become pregnant, seek counseling before you get pregnant. Take 400 to 800 micrograms (mcg) of folic acid every day if you become pregnant. Ask for birth control (contraception) if you want to prevent pregnancy. Osteoporosis and menopause Osteoporosis is a disease in which the bones lose minerals and strength with aging. This can result in bone fractures. If you are 51 years old or older, or if you are at risk for osteoporosis and fractures, ask your health care provider if you should: Be screened for bone loss. Take a calcium or vitamin D supplement to lower your risk of fractures. Be given hormone replacement therapy (HRT) to treat symptoms of menopause. Follow these instructions at home: Alcohol use Do not drink alcohol if: Your health care provider tells you not to drink. You are pregnant, may be pregnant, or are planning to become pregnant. If you drink alcohol: Limit how much you have to: 0-1 drink a day. Know how much alcohol is in your drink. In the  [place] one drink equals one 12 oz bottle of beer (355 mL), one 5 oz glass of wine (148 mL), or one 1 oz glass of hard liquor (44 mL). Lifestyle Do not use any products that contain nicotine or tobacco. These products include cigarettes, chewing tobacco, and vaping devices, such as e-cigarettes. If you need help quitting, ask your health care provider. Do not use street drugs. Do not share needles. Ask your health care provider for help if you need support or information about quitting drugs. General instructions Schedule regular health, dental, and eye exams. Stay current with your vaccines. Tell your health care provider if: You often feel depressed. You have ever been abused or do not feel safe at home. Summary Adopting a healthy lifestyle and getting preventive care are important in promoting health and wellness. Follow your health care provider's instructions about healthy diet, exercising, and getting tested or screened for diseases. Follow your health care provider's instructions on monitoring your cholesterol and blood pressure. This information is not intended to replace advice given to you by your health care provider. Make sure you discuss any questions you have with your health care provider. Document Revised: 10/04/2020 Document Reviewed: 10/04/2020 Elsevier Patient Education  2023 [employer].

## 2022-01-18 LAB — CYTOLOGY - PAP
Comment: NEGATIVE
Comment: NEGATIVE
Comment: NEGATIVE
Diagnosis: UNDETERMINED — AB
HPV 16: NEGATIVE
HPV 18 / 45: NEGATIVE
High risk HPV: POSITIVE — AB

## 2022-01-19 ENCOUNTER — Telehealth: Payer: Self-pay | Admitting: Emergency Medicine

## 2022-01-19 NOTE — Telephone Encounter (Signed)
Discussed results with patient, colpo scheduled.

## 2022-01-19 NOTE — Telephone Encounter (Signed)
Attempted TC to patient to discuss results.LVM 

## 2022-01-26 ENCOUNTER — Encounter: Payer: Self-pay | Admitting: Obstetrics and Gynecology

## 2022-02-25 NOTE — H&P (Signed)
Joy Hartman is an 31 y.o. female who desires sterilization.    Menstrual History: Menarche age: 69 No LMP recorded.    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 The result indicates an increased chance to be a silent (2+0) carrier of SMA.    Chlamydia    Gonorrhea    Headache    Hypertension    Panic attack    Trichomonas     No past surgical history on file.  Family History  Problem Relation Age of Onset   Asthma Mother    Hypertension Mother    Hypertension Father    Hypertension Maternal Grandmother    Hypertension Maternal Grandfather    Hypertension Paternal Grandmother    Hypertension Paternal Grandfather     Social History:  reports that she has never smoked. She has never used smokeless tobacco. She reports that she does not currently use alcohol. She reports that she does not currently use drugs after having used the following drugs: Marijuana.  Allergies:  Allergies  Allergen Reactions   Codeine Hives, Itching, Rash and Swelling   Hydrocodone Itching and Swelling    Itching and swelling    No medications prior to admission.    Review of Systems  Constitutional: Negative.   Respiratory: Negative.    Cardiovascular: Negative.   Gastrointestinal: Negative.   Genitourinary: Negative.     currently breastfeeding. Physical Exam Constitutional:      Appearance: Normal appearance.  Cardiovascular:     Rate and Rhythm: Normal rate and regular rhythm.  Pulmonary:     Effort: Pulmonary effort is normal.     Breath sounds: Normal breath sounds.  Abdominal:     General: Bowel sounds are normal.     Palpations: Abdomen is soft.  Genitourinary:    Comments: Nl EGBUS, small uterus, no masses    No results found for this or any previous visit (from the past 24 hour(s)).  No results found.  Assessment/Plan: Unwanted fertility  Laparoscopic  bilateral salpingectomy reviewed with pt. R/B/Post care reviewed with pt Pt desires to proceed.   Chancy Milroy 02/25/2022, 5:39 PM

## 2022-02-28 ENCOUNTER — Encounter (HOSPITAL_COMMUNITY): Payer: Self-pay

## 2022-02-28 NOTE — Progress Notes (Signed)
PCP - Gypsy Balsam, NP Cardiologist - n/a  Chest x-ray - n/a EKG - 03/01/22 Stress Test - n/a ECHO - n/a Cardiac Cath - n/a  ICD Pacemaker/Loop - n/a  Sleep Study -  n/a CPAP - none  ERAS: Clear liquids til 9:45 AM DOS.  STOP now taking any Aspirin (unless otherwise instructed by your surgeon), Aleve, Naproxen, Ibuprofen, Motrin, Advil, Goody's, BC's, all herbal medications, fish oil, and all vitamins.   Coronavirus Screening Do you have any of the following symptoms:  Cough yes/no: No Fever (>100.69F)  yes/no: No Runny nose yes/no: No Sore throat yes/no: No Difficulty breathing/shortness of breath  yes/no: No  Have you traveled in the last 14 days and where? yes/no: No  Patient verbalized understanding of instructions that were given to them at the PAT appointment. Patient was also instructed that they will need to review over the PAT instructions again at home before surgery.

## 2022-02-28 NOTE — Pre-Procedure Instructions (Signed)
Surgical Instructions    Your procedure is scheduled on Tuesday, October 10th.  Report to [facility] Main Entrance "A" at 10:45 A.M., then check in with the Admitting office.  Call this number if you have problems the morning of surgery:  [phone]   If you have any questions prior to your surgery date call [phone]: Open Monday-Friday 8am-4pm If you experience any cold or flu symptoms such as cough, fever, chills, shortness of breath, etc. between now and your scheduled surgery, please notify us at the above number     Remember:  Do not eat after midnight the night before your surgery  You may drink clear liquids until 9:45 a.m. the morning of your surgery.   Clear liquids allowed are: Water, Non-Citrus Juices (without pulp), Carbonated Beverages, Clear Tea, Black Coffee ONLY (NO MILK, CREAM OR POWDERED CREAMER of any kind), and Gatorade.     Take these medicines the morning of surgery with A SIP OF WATER:  acetaminophen (TYLENOL)-as needed  As of today, STOP taking any Aspirin (unless otherwise instructed by your surgeon) Aleve, Naproxen, Ibuprofen, Motrin, Advil, Goody's, BC's, all herbal medications, fish oil, and all vitamins.           Do not wear jewelry or makeup. Do not wear lotions, powders, perfumes or deodorant. Do not shave 48 hours prior to surgery.  Do not bring valuables to the hospital. Do not wear nail polish, gel polish, artificial nails, or any other type of covering on natural nails (fingers and toes) If you have artificial nails or gel coating that need to be removed by a nail salon, please have this removed prior to surgery. Artificial nails or gel coating may interfere with anesthesia's ability to adequately monitor your vital signs.  [place] is not responsible for any belongings or valuables.    Do NOT Smoke (Tobacco/Vaping)  24 hours prior to your procedure  If you use a CPAP at night, you may bring your mask for your overnight stay.    Contacts, glasses, hearing aids, dentures or partials may not be worn into surgery, please bring cases for these belongings   For patients admitted to the hospital, discharge time will be determined by your treatment team.   Patients discharged the day of surgery will not be allowed to drive home, and someone needs to stay with them for 24 hours.   SURGICAL WAITING ROOM VISITATION Patients having surgery or a procedure may have no more than 2 support people in the waiting area - these visitors may rotate.   Children under the age of 22 must have an adult with them who is not the patient. If the patient needs to stay at the hospital during part of their recovery, the visitor guidelines for inpatient rooms apply. Pre-op nurse will coordinate an appropriate time for 1 support person to accompany patient in pre-op.  This support person may not rotate.   Please refer to the [facility] website for the visitor guidelines for Inpatients (after your surgery is over and you are in a regular room).    Special instructions:    Oral Hygiene is also important to reduce your risk of infection.  Remember - BRUSH YOUR TEETH THE MORNING OF SURGERY WITH YOUR REGULAR TOOTHPASTE   [facility]- Preparing For Surgery  Before surgery, you can play an important role. Because skin is not sterile, your skin needs to be as free of germs as possible. You can reduce the number of germs on your skin  by washing with CHG (chlorahexidine gluconate) Soap before surgery.  CHG is an antiseptic cleaner which kills germs and bonds with the skin to continue killing germs even after washing.     Please do not use if you have an allergy to CHG or antibacterial soaps. If your skin becomes reddened/irritated stop using the CHG.  Do not shave (including legs and underarms) for at least 48 hours prior to first CHG shower. It is OK to shave your face.  Please follow these instructions carefully.     Shower the NIGHT BEFORE  SURGERY and the MORNING OF SURGERY with CHG Soap.   If you chose to wash your hair, wash your hair first as usual with your normal shampoo. After you shampoo, rinse your hair and body thoroughly to remove the shampoo.  Then [employer] and genitals (private parts) with your normal soap and rinse thoroughly to remove soap.  After that Use CHG Soap as you would any other liquid soap. You can apply CHG directly to the skin and wash gently with a scrungie or a clean washcloth.   Apply the CHG Soap to your body ONLY FROM THE NECK DOWN.  Do not use on open wounds or open sores. Avoid contact with your eyes, ears, mouth and genitals (private parts). Wash Face and genitals (private parts)  with your normal soap.   Wash thoroughly, paying special attention to the area where your surgery will be performed.  Thoroughly rinse your body with warm water from the neck down.  DO NOT shower/wash with your normal soap after using and rinsing off the CHG Soap.  Pat yourself dry with a CLEAN TOWEL.  Wear CLEAN PAJAMAS to bed the night before surgery  Place CLEAN SHEETS on your bed the night before your surgery  DO NOT SLEEP WITH PETS.   Day of Surgery:  Take a shower with CHG soap. Wear Clean/Comfortable clothing the morning of surgery Do not apply any deodorants/lotions.   Remember to brush your teeth WITH YOUR REGULAR TOOTHPASTE.    If you received a COVID test during your pre-op visit, it is requested that you wear a mask when out in public, stay away from anyone that may not be feeling well, and notify your surgeon if you develop symptoms. If you have been in contact with anyone that has tested positive in the last 10 days, please notify your surgeon.    Please read over the following fact sheets that you were given.

## 2022-03-01 ENCOUNTER — Encounter (HOSPITAL_COMMUNITY)
Admission: RE | Admit: 2022-03-01 | Discharge: 2022-03-01 | Disposition: A | Discharge status: Home / Self Care | Payer: Medicaid Other | Source: Ambulatory Visit | Attending: Obstetrics and Gynecology | Admitting: Obstetrics and Gynecology

## 2022-03-01 ENCOUNTER — Encounter (HOSPITAL_COMMUNITY): Payer: Self-pay

## 2022-03-01 ENCOUNTER — Other Ambulatory Visit: Payer: Self-pay

## 2022-03-01 VITALS — BP 133/85 | HR 74 | Temp 98.4°F | Resp 17 | Ht 64.0 in | Wt 181.9 lb

## 2022-03-01 DIAGNOSIS — Z01818 Encounter for other preprocedural examination: Secondary | ICD-10-CM | POA: Diagnosis present

## 2022-03-01 DIAGNOSIS — Z3009 Encounter for other general counseling and advice on contraception: Secondary | ICD-10-CM | POA: Insufficient documentation

## 2022-03-01 LAB — CBC
HCT: 33.5 % — ABNORMAL LOW (ref 36.0–46.0)
Hemoglobin: 10.5 g/dL — ABNORMAL LOW (ref 12.0–15.0)
MCH: 28.9 pg (ref 26.0–34.0)
MCHC: 31.3 g/dL (ref 30.0–36.0)
MCV: 92.3 fL (ref 80.0–100.0)
Platelets: 231 10*3/uL (ref 150–400)
RBC: 3.63 MIL/uL — ABNORMAL LOW (ref 3.87–5.11)
RDW: 13.2 % (ref 11.5–15.5)
WBC: 5.1 10*3/uL (ref 4.0–10.5)
nRBC: 0 % (ref 0.0–0.2)

## 2022-03-01 LAB — BASIC METABOLIC PANEL
Anion gap: 8 (ref 5–15)
BUN: 6 mg/dL (ref 6–20)
CO2: 26 mmol/L (ref 22–32)
Calcium: 9.2 mg/dL (ref 8.9–10.3)
Chloride: 105 mmol/L (ref 98–111)
Creatinine, Ser: 0.73 mg/dL (ref 0.44–1.00)
GFR, Estimated: >60 mL/min (ref >60)
Glucose, Bld: 97 mg/dL (ref 70–99)
Potassium: 3.8 mmol/L (ref 3.5–5.1)
Sodium: 139 mmol/L (ref 135–145)

## 2022-03-07 ENCOUNTER — Ambulatory Visit (HOSPITAL_BASED_OUTPATIENT_CLINIC_OR_DEPARTMENT_OTHER): Payer: Medicaid Other | Admitting: Certified Registered Nurse Anesthetist

## 2022-03-07 ENCOUNTER — Other Ambulatory Visit: Payer: Self-pay

## 2022-03-07 ENCOUNTER — Ambulatory Visit (HOSPITAL_COMMUNITY): Payer: Medicaid Other | Admitting: Certified Registered Nurse Anesthetist

## 2022-03-07 ENCOUNTER — Encounter (HOSPITAL_COMMUNITY)
Admission: RE | Disposition: A | Discharge status: Home / Self Care | Payer: Self-pay | Source: Home / Self Care | Attending: Obstetrics and Gynecology

## 2022-03-07 ENCOUNTER — Encounter (HOSPITAL_COMMUNITY): Payer: Self-pay | Admitting: Obstetrics and Gynecology

## 2022-03-07 ENCOUNTER — Ambulatory Visit (HOSPITAL_COMMUNITY)
Admission: RE | Admit: 2022-03-07 | Discharge: 2022-03-07 | Disposition: A | Discharge status: Home / Self Care | Payer: Medicaid Other | Attending: Obstetrics and Gynecology | Admitting: Obstetrics and Gynecology

## 2022-03-07 DIAGNOSIS — J45909 Unspecified asthma, uncomplicated: Secondary | ICD-10-CM | POA: Diagnosis not present

## 2022-03-07 DIAGNOSIS — I10 Essential (primary) hypertension: Secondary | ICD-10-CM | POA: Diagnosis not present

## 2022-03-07 DIAGNOSIS — Z302 Encounter for sterilization: Secondary | ICD-10-CM

## 2022-03-07 DIAGNOSIS — Z3009 Encounter for other general counseling and advice on contraception: Secondary | ICD-10-CM

## 2022-03-07 HISTORY — PX: LAPAROSCOPIC BILATERAL SALPINGECTOMY: SHX5889

## 2022-03-07 LAB — POCT PREGNANCY, URINE: Preg Test, Ur: NEGATIVE

## 2022-03-07 SURGERY — SALPINGECTOMY, BILATERAL, LAPAROSCOPIC
Anesthesia: General | Laterality: Bilateral

## 2022-03-07 MED ORDER — KETOROLAC TROMETHAMINE 15 MG/ML IJ SOLN
15.0000 mg | INTRAMUSCULAR | Status: AC
  Administered 2022-03-07: 15 mg via INTRAVENOUS
  Filled 2022-03-07: qty 1

## 2022-03-07 MED ORDER — DEXAMETHASONE SODIUM PHOSPHATE 10 MG/ML IJ SOLN
INTRAMUSCULAR | Status: DC | PRN
  Administered 2022-03-07: 10 mg via INTRAVENOUS

## 2022-03-07 MED ORDER — DEXAMETHASONE SODIUM PHOSPHATE 10 MG/ML IJ SOLN
INTRAMUSCULAR | Status: AC
  Filled 2022-03-07: qty 1

## 2022-03-07 MED ORDER — PHENYLEPHRINE 80 MCG/ML (10ML) SYRINGE FOR IV PUSH (FOR BLOOD PRESSURE SUPPORT)
PREFILLED_SYRINGE | INTRAVENOUS | Status: AC
  Filled 2022-03-07: qty 10

## 2022-03-07 MED ORDER — OXYCODONE HCL 5 MG PO TABS: 5.0000 mg | ORAL_TABLET | Freq: Four times a day (QID) | ORAL | 0 refills | Status: DC | PRN

## 2022-03-07 MED ORDER — LACTATED RINGERS IV SOLN: INTRAVENOUS | Status: DC

## 2022-03-07 MED ORDER — ROCURONIUM BROMIDE 10 MG/ML (PF) SYRINGE
PREFILLED_SYRINGE | INTRAVENOUS | Status: AC
  Filled 2022-03-07: qty 10

## 2022-03-07 MED ORDER — MIDAZOLAM HCL 5 MG/5ML IJ SOLN
INTRAMUSCULAR | Status: DC | PRN
  Administered 2022-03-07: 2 mg via INTRAVENOUS

## 2022-03-07 MED ORDER — LIDOCAINE 2% (20 MG/ML) 5 ML SYRINGE
INTRAMUSCULAR | Status: DC | PRN
  Administered 2022-03-07: 100 mg via INTRAVENOUS

## 2022-03-07 MED ORDER — FENTANYL CITRATE (PF) 100 MCG/2ML IJ SOLN
25.0000 ug | INTRAMUSCULAR | Status: DC | PRN
  Administered 2022-03-07 (×2): 50 ug via INTRAVENOUS

## 2022-03-07 MED ORDER — ONDANSETRON HCL 4 MG/2ML IJ SOLN
INTRAMUSCULAR | Status: DC | PRN
  Administered 2022-03-07: 4 mg via INTRAVENOUS

## 2022-03-07 MED ORDER — IBUPROFEN 800 MG PO TABS: 800.0000 mg | ORAL_TABLET | Freq: Three times a day (TID) | ORAL | 0 refills | Status: DC | PRN

## 2022-03-07 MED ORDER — PHENYLEPHRINE HCL (PRESSORS) 10 MG/ML IV SOLN
INTRAVENOUS | Status: DC | PRN
  Administered 2022-03-07 (×2): 80 ug via INTRAVENOUS

## 2022-03-07 MED ORDER — SODIUM CHLORIDE 0.9 % IV SOLN
2.0000 g | INTRAVENOUS | Status: AC
  Administered 2022-03-07: 2 g via INTRAVENOUS
  Filled 2022-03-07: qty 2

## 2022-03-07 MED ORDER — ONDANSETRON HCL 4 MG/2ML IJ SOLN
INTRAMUSCULAR | Status: AC
  Filled 2022-03-07: qty 2

## 2022-03-07 MED ORDER — PROPOFOL 10 MG/ML IV BOLUS
INTRAVENOUS | Status: AC
  Filled 2022-03-07: qty 20

## 2022-03-07 MED ORDER — ACETAMINOPHEN 500 MG PO TABS
1000.0000 mg | ORAL_TABLET | ORAL | Status: AC
  Administered 2022-03-07: 1000 mg via ORAL
  Filled 2022-03-07: qty 2

## 2022-03-07 MED ORDER — FENTANYL CITRATE (PF) 100 MCG/2ML IJ SOLN
INTRAMUSCULAR | Status: AC
  Filled 2022-03-07: qty 2

## 2022-03-07 MED ORDER — CHLORHEXIDINE GLUCONATE 0.12 % MT SOLN
15.0000 mL | OROMUCOSAL | Status: AC
  Administered 2022-03-07: 15 mL via OROMUCOSAL
  Filled 2022-03-07 (×2): qty 15

## 2022-03-07 MED ORDER — LIDOCAINE 2% (20 MG/ML) 5 ML SYRINGE
INTRAMUSCULAR | Status: AC
  Filled 2022-03-07: qty 5

## 2022-03-07 MED ORDER — FENTANYL CITRATE (PF) 250 MCG/5ML IJ SOLN
INTRAMUSCULAR | Status: AC
  Filled 2022-03-07: qty 5

## 2022-03-07 MED ORDER — FENTANYL CITRATE (PF) 250 MCG/5ML IJ SOLN
INTRAMUSCULAR | Status: DC | PRN
  Administered 2022-03-07: 50 ug via INTRAVENOUS
  Administered 2022-03-07: 100 ug via INTRAVENOUS

## 2022-03-07 MED ORDER — KETOROLAC TROMETHAMINE 15 MG/ML IJ SOLN
INTRAMUSCULAR | Status: DC | PRN
  Administered 2022-03-07: 15 mg via INTRAVENOUS

## 2022-03-07 MED ORDER — BUPIVACAINE HCL 0.5 % IJ SOLN
INTRAMUSCULAR | Status: DC | PRN
  Administered 2022-03-07: 18 mL

## 2022-03-07 MED ORDER — KETOROLAC TROMETHAMINE 30 MG/ML IJ SOLN
INTRAMUSCULAR | Status: AC
  Filled 2022-03-07: qty 1

## 2022-03-07 MED ORDER — POVIDONE-IODINE 10 % EX SWAB: 2.0000 | Freq: Once | CUTANEOUS | Status: DC

## 2022-03-07 MED ORDER — SOD CITRATE-CITRIC ACID 500-334 MG/5ML PO SOLN
30.0000 mL | ORAL | Status: AC
  Administered 2022-03-07: 30 mL via ORAL
  Filled 2022-03-07: qty 30

## 2022-03-07 MED ORDER — PROPOFOL 10 MG/ML IV BOLUS
INTRAVENOUS | Status: DC | PRN
  Administered 2022-03-07: 150 mg via INTRAVENOUS

## 2022-03-07 MED ORDER — SUGAMMADEX SODIUM 200 MG/2ML IV SOLN
INTRAVENOUS | Status: DC | PRN
  Administered 2022-03-07: 328.4 mg via INTRAVENOUS

## 2022-03-07 MED ORDER — ROCURONIUM BROMIDE 10 MG/ML (PF) SYRINGE
PREFILLED_SYRINGE | INTRAVENOUS | Status: DC | PRN
  Administered 2022-03-07: 50 mg via INTRAVENOUS

## 2022-03-07 MED ORDER — MIDAZOLAM HCL 2 MG/2ML IJ SOLN
INTRAMUSCULAR | Status: AC
  Filled 2022-03-07: qty 2

## 2022-03-07 SURGICAL SUPPLY — 23 items
DERMABOND ADVANCED .7 DNX12 (GAUZE/BANDAGES/DRESSINGS) ×1 IMPLANT
DURAPREP 26ML APPLICATOR (WOUND CARE) ×1 IMPLANT
GLOVE BIO SURGEON STRL SZ7.5 (GLOVE) ×1 IMPLANT
GLOVE BIOGEL PI IND STRL 7.0 (GLOVE) ×2 IMPLANT
GLOVE BIOGEL PI IND STRL 8 (GLOVE) ×1 IMPLANT
GOWN STRL REUS W/ TWL LRG LVL3 (GOWN DISPOSABLE) ×1 IMPLANT
GOWN STRL REUS W/TWL 2XL LVL3 (GOWN DISPOSABLE) IMPLANT
GOWN STRL REUS W/TWL LRG LVL3 (GOWN DISPOSABLE) ×1
KIT TURNOVER KIT B (KITS) ×1 IMPLANT
PACK LAPAROSCOPY BASIN (CUSTOM PROCEDURE TRAY) ×1 IMPLANT
PACK TRENDGUARD 450 HYBRID PRO (MISCELLANEOUS) ×1 IMPLANT
PROTECTOR NERVE ULNAR (MISCELLANEOUS) ×2 IMPLANT
SET TUBE SMOKE EVAC HIGH FLOW (TUBING) ×1 IMPLANT
SHEARS HARMONIC ACE PLUS 36CM (ENDOMECHANICALS) IMPLANT
SLEEVE XCEL OPT CAN 5 100 (ENDOMECHANICALS) ×1 IMPLANT
SUT MNCRL AB 4-0 PS2 18 (SUTURE) ×1 IMPLANT
SUT VICRYL 0 UR6 27IN ABS (SUTURE) ×1 IMPLANT
TOWEL GREEN STERILE FF (TOWEL DISPOSABLE) ×2 IMPLANT
TRAY FOLEY W/BAG SLVR 14FR (SET/KITS/TRAYS/PACK) ×1 IMPLANT
TRENDGUARD 450 HYBRID PRO PACK (MISCELLANEOUS) ×1
TROCAR BALLN 12MMX100 BLUNT (TROCAR) ×1 IMPLANT
TROCAR XCEL NON-BLD 5MMX100MML (ENDOMECHANICALS) ×1 IMPLANT
WARMER LAPAROSCOPE (MISCELLANEOUS) ×1 IMPLANT

## 2022-03-07 NOTE — Anesthesia Postprocedure Evaluation (Signed)
Anesthesia Post Note  Patient: Joy Hartman  Procedure(s) Performed: LAPAROSCOPIC BILATERAL SALPINGECTOMY (Bilateral)     Patient location during evaluation: PACU Anesthesia Type: General Level of consciousness: awake Pain management: pain level controlled Vital Signs Assessment: post-procedure vital signs reviewed and stable Respiratory status: spontaneous breathing Cardiovascular status: stable Postop Assessment: no apparent nausea or vomiting Anesthetic complications: no   No notable events documented.  Last Vitals:  Vitals:   03/07/22 1052 03/07/22 1352  BP: 134/88 120/68  Pulse: 76 79  Resp: 18 14  Temp: (!) 36.3 C 36.5 C    Last Pain:  Vitals:   03/07/22 1352  TempSrc:   PainSc: Asleep                 Kyng Matlock

## 2022-03-07 NOTE — Transfer of Care (Signed)
Immediate Anesthesia Transfer of Care Note  Patient: Joy Hartman  Procedure(s) Performed: LAPAROSCOPIC BILATERAL SALPINGECTOMY (Bilateral)  Patient Location: PACU  Anesthesia Type:General  Level of Consciousness: awake, alert , and oriented  Airway & Oxygen Therapy: Patient Spontanous Breathing and Patient connected to nasal cannula oxygen  Post-op Assessment: Report given to RN, Post -op Vital signs reviewed and stable, Patient moving all extremities X 4, and Patient able to stick tongue midline  Post vital signs: Reviewed  Last Vitals:  Vitals Value Taken Time  BP 120/68 03/07/22 1352  Temp 98.6   Pulse 94   Resp 13 03/07/22 1353  SpO2 100   Vitals shown include unvalidated device data.  Last Pain:  Vitals:   03/07/22 1103  TempSrc:   PainSc: 0-No pain      Patients Stated Pain Goal: 0 (69/79/48 0165)  Complications: No notable events documented.

## 2022-03-07 NOTE — Anesthesia Procedure Notes (Signed)
Procedure Name: Intubation Date/Time: 03/07/2022 12:48 PM  Performed by: Maude Leriche, CRNAPre-anesthesia Checklist: Patient identified, Emergency Drugs available, Suction available and Patient being monitored Patient Re-evaluated:Patient Re-evaluated prior to induction Oxygen Delivery Method: Circle system utilized Preoxygenation: Pre-oxygenation with 100% oxygen Induction Type: IV induction Ventilation: Mask ventilation without difficulty Laryngoscope Size: Miller and 2 Grade View: Grade I Tube type: Oral Tube size: 7.0 mm Number of attempts: 1 Airway Equipment and Method: Stylet Placement Confirmation: ETT inserted through vocal cords under direct vision, positive ETCO2 and breath sounds checked- equal and bilateral Secured at: 21 cm Tube secured with: Tape Dental Injury: Teeth and Oropharynx as per pre-operative assessment

## 2022-03-07 NOTE — Op Note (Signed)
Joy Hartman PROCEDURE DATE: 03/07/2022   PREOPERATIVE DIAGNOSIS:  Undesired fertility  POSTOPERATIVE DIAGNOSIS:  Undesired fertility  PROCEDURE:  Laparoscopic Bilateral Salpingectomy   SURGEON: Arlina Robes MD  ASSISTANT: Leland Johns, MD  An experienced assistant was required given the standard of surgical care given the complexity of the case.  This assistant was needed for exposure, dissection, suctioning, retraction, instrument exchange, ,and for overall help during the procedure.    ANESTHESIA:  General endotracheal  COMPLICATIONS:  None immediate.  ESTIMATED BLOOD LOSS:  10 ml.  FLUIDS: As recorded.  URINE OUTPUT:  As recorded   IINDICATIONS: 31 y.o. [id number] with undesired fertility, desires permanent sterilization. Other reversible forms of contraception were discussed with patient; she declines all other modalities.  Risks of procedure discussed with patient including permanence of method, risk of regret, bleeding, infection, injury to surrounding organs and need for additional procedures including laparotomy.  Failure risk less than 0.5% with increased risk of ectopic gestation if pregnancy occurs was also discussed with patient.  Written informed consent was obtained.    FINDINGS:  Normal uterus, fallopian tubes, and ovaries.  TECHNIQUE:  The patient was taken to the operating room where general anesthesia was obtained without difficulty.  She was then placed in the dorsal lithotomy position and prepared and draped in sterile fashion.  After an adequate timeout was performed, a bivalved speculum was then placed in the patient's vagina, and the anterior lip of cervix grasped with the single-tooth tenaculum.  The uterine manipulator was then advanced into the uterus.  The speculum was removed from the vagina.  Attention was then turned to the patient's abdomen where a 11-mm skin incision was made in the umbilical fold.  The Optiview 11-mm trocar and sleeve were then advanced  without difficulty with the laparoscope under direct visualization into the abdomen.  The abdomen was then insufflated with carbon dioxide gas.  Adequate pneumoperitoneum was obtained.  A survey of the patient's pelvis and abdomen revealed the findings above. Bilateral 5-mm lower quadrant ports  were then placed under direct visualization.  The fallopian tubes were transected from the uterine attachments and the underlying mesosalpinx with the Harmonic device allowing for bilateral salpingectomy.  The fallopian tubes were then removed from the abdomen under direct visualization.  The operative site was surveyed, and it was found to be hemostatic.   No intraoperative injury to other surrounding organs was noted.  The abdomen was desufflated and all instruments were then removed from the patient's abdomen. The fascial incision of the umbilicus was closed with a 0 Vicryl figure of eight stitch. All skin incisions were closed with Dermabond.  The uterine manipulator was removed from the cervix without complications. The patient tolerated the procedure well.  Sponge, lap, and needle counts were correct times two.  The patient was then taken to the recovery room awake, extubated and in stable condition.  The patient will be discharged to home as per PACU criteria.  Routine postoperative instructions given.  She was prescribed Percocet, Ibuprofen .  She will follow up in the clinic  for postoperative evaluation.   Arlina Robes, MD, [place] Attending [place], [place]

## 2022-03-07 NOTE — Interval H&P Note (Signed)
History and Physical Interval Note:  03/07/2022 11:58 AM  Joy Hartman  has presented today for surgery, with the diagnosis of Undesired Fertility.  The various methods of treatment have been discussed with the patient and family. After consideration of risks, benefits and other options for treatment, the patient has consented to  Procedure(s): LAPAROSCOPIC BILATERAL SALPINGECTOMY (Bilateral) as a surgical intervention.  The patient's history has been reviewed, patient examined, no change in status, stable for surgery.  I have reviewed the patient's chart and labs.  Questions were answered to the patient's satisfaction.     Chancy Milroy

## 2022-03-07 NOTE — Anesthesia Preprocedure Evaluation (Addendum)
Anesthesia Evaluation  Patient identified by MRN, date of birth, ID band Patient awake    Reviewed: Allergy & Precautions, NPO status , Patient's Chart, lab work & pertinent test results  Airway Mallampati: II       Dental   Pulmonary asthma    breath sounds clear to auscultation       Cardiovascular hypertension,  Rhythm:Regular Rate:Normal     Neuro/Psych    GI/Hepatic negative GI ROS, Neg liver ROS,,,  Endo/Other  negative endocrine ROS    Renal/GU negative Renal ROS     Musculoskeletal   Abdominal   Peds  Hematology   Anesthesia Other Findings   Reproductive/Obstetrics Hx noted Dr. Nyoka Cowden                             Anesthesia Physical Anesthesia Plan  ASA: 3  Anesthesia Plan: General   Post-op Pain Management:    Induction: Intravenous  PONV Risk Score and Plan: 3 and Ondansetron, Dexamethasone and Midazolam  Airway Management Planned: Oral ETT  Additional Equipment:   Intra-op Plan:   Post-operative Plan: Extubation in OR  Informed Consent:      Dental advisory given  Plan Discussed with: CRNA and Anesthesiologist  Anesthesia Plan Comments:        Anesthesia Quick Evaluation

## 2022-03-08 ENCOUNTER — Encounter (HOSPITAL_COMMUNITY): Payer: Self-pay | Admitting: Obstetrics and Gynecology

## 2022-03-08 LAB — SURGICAL PATHOLOGY

## 2022-03-24 ENCOUNTER — Other Ambulatory Visit (HOSPITAL_COMMUNITY)
Admission: RE | Admit: 2022-03-24 | Discharge: 2022-03-24 | Disposition: A | Discharge status: Home / Self Care | Payer: Medicaid Other | Source: Ambulatory Visit | Attending: Obstetrics & Gynecology | Admitting: Obstetrics & Gynecology

## 2022-03-24 ENCOUNTER — Ambulatory Visit (INDEPENDENT_AMBULATORY_CARE_PROVIDER_SITE_OTHER): Payer: Medicaid Other | Admitting: Obstetrics and Gynecology

## 2022-03-24 VITALS — BP 134/88 | HR 71 | Wt 182.0 lb

## 2022-03-24 DIAGNOSIS — R87610 Atypical squamous cells of undetermined significance on cytologic smear of cervix (ASC-US): Secondary | ICD-10-CM

## 2022-03-24 DIAGNOSIS — R87810 Cervical high risk human papillomavirus (HPV) DNA test positive: Secondary | ICD-10-CM | POA: Diagnosis present

## 2022-03-24 NOTE — Progress Notes (Signed)
    GYNECOLOGY PROCEDURE VISIT  Patient name: Joy Hartman MRN [id number]  Date of birth: [birth year] Chief Complaint:   Colposcopy  History:  Joy Hartman is a 31 y.o. [phone] being seen today for colposcopy for ASCUS + HRHPV+.  No complaints today. Has had a colposcopy before.   The following portions of the patient's history were reviewed and updated as appropriate: allergies, current medications, past family history, past medical history, past social history, past surgical history and problem list.   Review of Systems:  Pertinent items are noted in HPI. Comprehensive review of systems was otherwise negative.   Objective:  Physical Exam BP 134/88   Pulse 71   Wt 182 lb (82.6 kg)   LMP 02/22/2022 (Approximate) Comment: Depro Injection - last one in 11/2021  BMI 30.29 kg/m    Physical Exam    OFFICE COLPOSCOPY PROCEDURE NOTE  Colposcopy for ASCUS with POSITIVE high risk HPV pap smear on 12/2021. Discussed role for HPV in cervical dysplasia, need for surveillance.  Patient gave informed written consent, time out was performed.  Placed in lithotomy position. Cervix viewed with speculum and colposcope after application of acetic acid.   Colposcopy adequate? Yes  no mosaicism, no abnormal vasculature, and acetowhite lesion(s) noted at 7 o'clock and 11 oclock; corresponding biopsies obtained.  ECC specimen obtained. All specimens were labeled and sent to pathology.  Chaperone was present during entire procedure.  Patient was given post procedure instructions.  Will follow up pathology and manage accordingly; patient will be contacted with results and recommendations.  Routine preventative health maintenance measures emphasized.     Assessment & Plan:   1. 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

## 2022-03-27 ENCOUNTER — Telehealth: Payer: Self-pay | Admitting: Obstetrics and Gynecology

## 2022-03-27 LAB — SURGICAL PATHOLOGY

## 2022-03-27 NOTE — Telephone Encounter (Signed)
Called patient, ID confirmed x2.   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 Patient s/p TL, no desires for future conception.   Darliss Cheney, MD Minimally Invasive Gynecologic Surgery 03/27/22 4:23 PM

## 2022-03-28 NOTE — Progress Notes (Signed)
TC to pt. Advised of abnormal colpo and recommendation for LEEP. Pt aware. Message sent by Dr. Currie Paris with education on LEEP. Pt verbalized understanding. Schedulers to call pt back to schedule procedure.

## 2022-03-30 ENCOUNTER — Encounter: Payer: Self-pay | Admitting: Obstetrics and Gynecology

## 2022-03-30 ENCOUNTER — Other Ambulatory Visit (HOSPITAL_COMMUNITY)
Admission: RE | Admit: 2022-03-30 | Discharge: 2022-03-30 | Disposition: A | Discharge status: Home / Self Care | Payer: Medicaid Other | Source: Ambulatory Visit | Attending: Obstetrics and Gynecology | Admitting: Obstetrics and Gynecology

## 2022-03-30 ENCOUNTER — Ambulatory Visit (INDEPENDENT_AMBULATORY_CARE_PROVIDER_SITE_OTHER): Payer: Medicaid Other | Admitting: Obstetrics and Gynecology

## 2022-03-30 VITALS — BP 148/90 | HR 74 | Wt 181.9 lb

## 2022-03-30 DIAGNOSIS — R87613 High grade squamous intraepithelial lesion on cytologic smear of cervix (HGSIL): Secondary | ICD-10-CM

## 2022-03-30 LAB — POCT URINE PREGNANCY: Preg Test, Ur: NEGATIVE

## 2022-03-30 NOTE — Progress Notes (Signed)
31 yo here for LEEP due to abnormal colposcopy. 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 The result indicates an increased chance to be a silent (2+0) carrier of SMA.    Chlamydia    Gonorrhea    Headache    History of blood transfusion 2022   with vaginal delivery   Hypertension    currently no meds   Panic attack    Trichomonas    Family History  Problem Relation Age of Onset   Asthma Mother    Hypertension Mother    Hypertension Father    Hypertension Maternal Grandmother    Hypertension Maternal Grandfather    Hypertension Paternal Grandmother    Hypertension Paternal Grandfather    Family History  Problem Relation Age of Onset   Asthma Mother    Hypertension Mother    Hypertension Father    Hypertension Maternal Grandmother    Hypertension Maternal Grandfather    Hypertension Paternal Grandmother    Hypertension Paternal Grandfather    Social History   Tobacco Use   Smoking status: Never    Passive exposure: Never   Smokeless tobacco: Never  Vaping Use   Vaping Use: Never used  Substance Use Topics   Alcohol use: Not Currently    Comment: wine   Drug use: Not Currently    Types: Marijuana    Comment: Last use was approx  02/11/22   ROS See pertinent in HPI. All other systems reviewed and non contributory  Blood pressure (!) 148/90, pulse 74, weight 181 lb 14.4 oz (82.5 kg), last menstrual period 02/22/2022, currently breastfeeding. GENERAL: Well-developed, well-nourished female in no acute distress.  ABDOMEN: Soft, nontender, nondistended. No organomegaly. PELVIC: Normal external female genitalia. Vagina is pink and rugated.  Normal discharge. Normal appearing cervix. Uterus is normal in size. No adnexal mass or tenderness. Chaperone present during the pelvic exam EXTREMITIES: No cyanosis,  clubbing, or edema, 2+ distal pulses.  A/P 31 yo here for LEEP Patient identified, informed consent obtained, signed copy in chart, time out performed.  Pap smear and colposcopy reviewed.   Pap ASCUS + HPV Colpo Biopsy HGSIL  ECC negative  Teflon coated speculum with smoke evacuator placed.  Cervix visualized. Paracervical block placed.  Large size LOOP used to remove cone of cervix using blend of cut and cautery on LEEP machine.  Edges/Base cauterized with Ball.  Monsel's solution used for hemostasis.  Patient tolerated procedure well.  Patient given post procedure instructions.  Advised patient to receive HPV vaccine Patient will be contacted with abnormal results and further management plan

## 2022-04-03 LAB — SURGICAL PATHOLOGY

## 2022-04-04 ENCOUNTER — Encounter: Payer: Medicaid Other | Admitting: Obstetrics and Gynecology

## 2022-12-09 ENCOUNTER — Ambulatory Visit (HOSPITAL_COMMUNITY)
Admission: EM | Admit: 2022-12-09 | Discharge: 2022-12-09 | Disposition: A | Discharge status: Home / Self Care | Payer: Medicaid Other | Attending: Physician Assistant | Admitting: Physician Assistant

## 2022-12-09 ENCOUNTER — Ambulatory Visit (INDEPENDENT_AMBULATORY_CARE_PROVIDER_SITE_OTHER): Payer: Medicaid Other

## 2022-12-09 ENCOUNTER — Encounter (HOSPITAL_COMMUNITY): Payer: Self-pay

## 2022-12-09 DIAGNOSIS — R0789 Other chest pain: Secondary | ICD-10-CM

## 2022-12-09 DIAGNOSIS — I10 Essential (primary) hypertension: Secondary | ICD-10-CM | POA: Diagnosis not present

## 2022-12-09 DIAGNOSIS — M940 Chondrocostal junction syndrome [Tietze]: Secondary | ICD-10-CM | POA: Diagnosis not present

## 2022-12-09 MED ORDER — IBUPROFEN 600 MG PO TABS: 600.0000 mg | ORAL_TABLET | Freq: Three times a day (TID) | ORAL | 0 refills | Status: DC | PRN

## 2022-12-09 NOTE — Discharge Instructions (Signed)
Start ibuprofen 600 mg up to 3 times a day.  Do not take additional NSAIDs with this medication including aspirin, ibuprofen/Advil, naproxen/Aleve.  You can use Tylenol/acetaminophen for breakthrough pain.  Follow-up with your primary care next week.  I do recommend you follow-up with a cardiologist as well.  Call them to schedule an appointment.  If you have any changing or worsening symptoms you need to go to the emergency room as we discussed.

## 2022-12-09 NOTE — ED Triage Notes (Signed)
Patient here today with c/o CP since Tuesday and has gotten worse today. Patient was at work today and her employer called 911. They came out and did an EKG. Patient brought EKG with her. Her BP read 152/102. She took an aspirin today with some improvement.

## 2022-12-09 NOTE — ED Provider Notes (Signed)
[facility]    CSN: [id number] Arrival date & time: 12/09/22  1223      History   Chief Complaint Chief Complaint  Patient presents with   Chest Pain    HPI Joy Hartman is a 32 y.o. female.   Patient presents today with a 5-day history of chest discomfort.  She reports that this has been intermittent but became worse and more consistent earlier today when she was at work.  Pain is rated 8 on a 0-10 pain scale, generalized to substernal region without radiation, described as pressure no aggravating alleviating factors identified.  She reports her symptoms were worse when she was climbing up a ladder they have since improved and she has not noticed a specific relationship between activity and worsening symptoms.  She does report that she had hypertension in the past but has not taken antihypertensive medications.  She denies history of diabetes, hyperlipidemia, smoking.  She does have a family history of cardiovascular disease in her father at the age of 51.  She was at work earlier when her symptoms worsened and then called 911.  She was evaluated by EMS and an EKG that she provides to [place] that showed normal sinus rhythm with short PR interval without delta wave.  She was given aspirin which provided some improvement of symptoms.  She denies any recent illness or additional symptoms including cough or congestion.  Denies any known injury or increase in activity.    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 The result indicates an increased chance to be a silent (2+0) carrier of SMA.    Chlamydia    Gonorrhea    Headache    History of blood transfusion 2022   with vaginal delivery   Hypertension    currently no meds   Panic attack    Trichomonas     Patient Active Problem List   Diagnosis Date Noted   Postpartum care following vaginal  delivery 01/13/2022   HGSIL (high grade squamous intraepithelial lesion) on Pap smear of cervix 01/13/2022   Unwanted fertility 01/13/2022   History of eclampsia 10/05/2021   Chronic nonintractable headache 09/07/2020   Panic attacks 03/30/2014   Asthma 07/13/2010    Past Surgical History:  Procedure Laterality Date   LAPAROSCOPIC BILATERAL SALPINGECTOMY Bilateral 03/07/2022   Procedure: LAPAROSCOPIC BILATERAL SALPINGECTOMY;  Surgeon: Hermina Staggers, MD;  Location: [facility];  Service: Gynecology;  Laterality: Bilateral;   WISDOM TOOTH EXTRACTION      OB History     Gravida  4   Para  3   Term  3   Preterm  0   AB  1   Living  3      SAB  1   IAB  0   Ectopic  0   Multiple  0   Live Births  3            Home Medications    Prior to Admission medications   Medication Sig Start Date End Date Taking? Authorizing Provider  ibuprofen (ADVIL) 600 MG tablet Take 1 tablet (600 mg total) by mouth every 8 (eight) hours as needed. 12/09/22  Yes Kalynne Womac, Noberto Retort, PA-C    Family History Family History  Problem Relation Age of Onset   Asthma Mother    Hypertension Mother  Hypertension Father    Hypertension Maternal Grandmother    Hypertension Maternal Grandfather    Hypertension Paternal Grandmother    Hypertension Paternal Grandfather     Social History Social History   Tobacco Use   Smoking status: Never    Passive exposure: Never   Smokeless tobacco: Never  Vaping Use   Vaping status: Never Used  Substance Use Topics   Alcohol use: Not Currently    Comment: wine   Drug use: Not Currently    Types: Marijuana    Comment: Last use was approx  02/11/22     Allergies   Codeine and Hydrocodone   Review of Systems Review of Systems  Constitutional:  Positive for activity change. Negative for appetite change, fatigue and fever.  HENT:  Negative for congestion, sinus pressure, sneezing and sore throat.   Respiratory:  Positive for shortness of  breath (resolved). Negative for cough.   Cardiovascular:  Positive for chest pain. Negative for palpitations and leg swelling.  Gastrointestinal:  Negative for abdominal pain, diarrhea, nausea and vomiting.     Physical Exam Triage Vital Signs ED Triage Vitals  Encounter Vitals Group     BP 12/09/22 1322 125/83     Systolic BP Percentile --      Diastolic BP Percentile --      Pulse Rate 12/09/22 1322 67     Resp 12/09/22 1322 16     Temp 12/09/22 1322 98.4 F (36.9 C)     Temp Source 12/09/22 1322 Oral     SpO2 12/09/22 1322 99 %     Weight 12/09/22 1322 185 lb (83.9 kg)     Height 12/09/22 1322 5\' 6"  (1.676 m)     Head Circumference --      Peak Flow --      Pain Score 12/09/22 1321 8     Pain Loc --      Pain Education --      Exclude from Growth Chart --    No data found.  Updated Vital Signs BP 125/83 (BP Location: Left Arm)   Pulse 67   Temp 98.4 F (36.9 C) (Oral)   Resp 16   Ht 5\' 6"  (1.676 m)   Wt 185 lb (83.9 kg)   LMP 11/20/2022 (Exact Date)   SpO2 99%   Breastfeeding Yes   BMI 29.86 kg/m   Visual Acuity Right Eye Distance:   Left Eye Distance:   Bilateral Distance:    Right Eye Near:   Left Eye Near:    Bilateral Near:     Physical Exam Vitals reviewed.  Constitutional:      General: She is awake. She is not in acute distress.    Appearance: Normal appearance. She is well-developed. She is not ill-appearing.     Comments: Very pleasant female appears stated age in no acute distress sitting comfortably in exam room  HENT:     Head: Normocephalic and atraumatic.  Cardiovascular:     Rate and Rhythm: Normal rate and regular rhythm.     Heart sounds: Normal heart sounds, S1 normal and S2 normal. No murmur heard. Pulmonary:     Effort: Pulmonary effort is normal.     Breath sounds: Normal breath sounds. No wheezing, rhonchi or rales.     Comments: Clear to auscultation bilaterally Chest:     Chest wall: Tenderness present. No deformity or  swelling.     Comments: Pain is reproducible on exam. Abdominal:  General: Bowel sounds are normal.     Palpations: Abdomen is soft.     Tenderness: There is no abdominal tenderness. There is no right CVA tenderness, left CVA tenderness, guarding or rebound.     Comments: Benign abdominal exam  Psychiatric:        Behavior: Behavior is cooperative.      UC Treatments / Results  Labs (all labs ordered are listed, but only abnormal results are displayed) Labs Reviewed - No data to display  EKG   Radiology DG Chest 2 View  Result Date: 12/09/2022 CLINICAL DATA:  Chest pain for 5 days. EXAM: CHEST - 2 VIEW COMPARISON:  06/02/2020 FINDINGS: The heart size and mediastinal contours are within normal limits. Both lungs are clear. The visualized skeletal structures are unremarkable. IMPRESSION: Normal exam. Electronically Signed   By: Danae Orleans M.D.   On: 12/09/2022 14:30    Procedures Procedures (including critical care time)  Medications Ordered in UC Medications - No data to display  Initial Impression / Assessment and Plan / UC Course  I have reviewed the triage vital signs and the nursing notes.  Pertinent labs & imaging results that were available during my care of the patient were reviewed by me and considered in my medical decision making (see chart for details).     Patient is well-appearing, afebrile, nontoxic, nontachycardic.  EKG was obtained that showed sinus rhythm with short PR interval with ventricular rate of 63 bpm without ischemic changes; compared to 03/01/2022 patient has T wave inversion in V2.  Chest x-ray was obtained that was normal.  Patient's pain is reproducible on exam we discussed that this indicates musculoskeletal etiology.  She was started on ibuprofen 600 mg up to 3 times a day for pain relief.  We discussed that she should not take additional NSAIDs with this medication due to risk of GI bleeding.  Can use heat and gentle stretch for additional  symptom relief.  Given her short PR interval and EKG changes I did recommend she follow-up with cardiology.  She was given contact information for local provider with instruction to call to schedule an appointment.  We discussed that if she has any changing or worsening symptoms including recurrent chest pain, severe chest pain, shortness of breath, weakness, lightheadedness she needs to go to the emergency room immediately to which she expressed understanding.  Final Clinical Impressions(s) / UC Diagnoses   Final diagnoses:  Costochondritis  Chest wall pain     Discharge Instructions      Start ibuprofen 600 mg up to 3 times a day.  Do not take additional NSAIDs with this medication including aspirin, ibuprofen/Advil, naproxen/Aleve.  You can use Tylenol/acetaminophen for breakthrough pain.  Follow-up with your primary care next week.  I do recommend you follow-up with a cardiologist as well.  Call them to schedule an appointment.  If you have any changing or worsening symptoms you need to go to the emergency room as we discussed.     ED Prescriptions     Medication Sig Dispense Auth. Provider   ibuprofen (ADVIL) 600 MG tablet Take 1 tablet (600 mg total) by mouth every 8 (eight) hours as needed. 30 tablet Kember Boch, Noberto Retort, PA-C      PDMP not reviewed this encounter.   Jeani Hawking, PA-C 12/09/22 1445

## 2023-01-23 ENCOUNTER — Other Ambulatory Visit: Payer: Self-pay

## 2023-02-08 NOTE — Progress Notes (Deleted)
Cardiology Office Note:  .   Date:  02/08/2023  ID:  Joy Hartman, DOB March 01, 1991, MRN [id number] PCP: Rema Fendt, NP  [facility] Providers Cardiologist:  None { Click to update primary MD,subspecialty MD or APP then REFRESH:1}   History of Present Illness: Joy Hartman is a 32 y.o. female no cardiac hx, she had CP and presented to urgent care. She was diagnosed with costochondritis. She was referred to cardiology   ROS:  per HPI otherwise negative   Studies Reviewed: .        *** Risk Assessment/Calculations:        Physical Exam:   VS:  There were no vitals taken for this visit.   Wt Readings from Last 3 Encounters:  12/09/22 185 lb (83.9 kg)  03/30/22 181 lb 14.4 oz (82.5 kg)  03/24/22 182 lb (82.6 kg)    GEN: Well nourished, well developed in no acute distress NECK: No JVD; No carotid bruits CARDIAC: ***RRR, no murmurs, rubs, gallops RESPIRATORY:  Clear to auscultation without rales, wheezing or rhonchi  ABDOMEN: Soft, non-tender, non-distended EXTREMITIES:  No edema; No deformity   ASSESSMENT AND PLAN: .   Non cardiac CP -she has low CVD risk factors and atypical symptoms. EKG is unremarkable. Consider the low pretest probability and atypical pain will not recommend further testing.  Agree with MSK as likely cause    {Are you ordering a CV Procedure (e.g. stress test, cath, DCCV, TEE, etc)?   Press F2        :[id number]}  Dispo: ***  Signed, Maisie Fus, MD

## 2023-02-09 ENCOUNTER — Ambulatory Visit: Payer: 59 | Attending: Internal Medicine | Admitting: Internal Medicine

## 2023-02-12 ENCOUNTER — Encounter: Payer: Self-pay | Admitting: Internal Medicine

## 2023-02-12 ENCOUNTER — Encounter (HOSPITAL_COMMUNITY): Payer: Self-pay

## 2023-02-12 ENCOUNTER — Ambulatory Visit (HOSPITAL_COMMUNITY)
Admission: EM | Admit: 2023-02-12 | Discharge: 2023-02-12 | Disposition: A | Discharge status: Home / Self Care | Payer: 59 | Attending: Family Medicine | Admitting: Family Medicine

## 2023-02-12 DIAGNOSIS — L03111 Cellulitis of right axilla: Secondary | ICD-10-CM | POA: Diagnosis not present

## 2023-02-12 MED ORDER — KETOROLAC TROMETHAMINE 30 MG/ML IJ SOLN
30.0000 mg | Freq: Once | INTRAMUSCULAR | Status: AC
  Administered 2023-02-12: 30 mg via INTRAMUSCULAR

## 2023-02-12 MED ORDER — KETOROLAC TROMETHAMINE 10 MG PO TABS: 10.0000 mg | ORAL_TABLET | Freq: Four times a day (QID) | ORAL | 0 refills | Status: DC | PRN

## 2023-02-12 MED ORDER — KETOROLAC TROMETHAMINE 30 MG/ML IJ SOLN
INTRAMUSCULAR | Status: AC
  Filled 2023-02-12: qty 1

## 2023-02-12 MED ORDER — DOXYCYCLINE HYCLATE 100 MG PO CAPS: 100.0000 mg | ORAL_CAPSULE | Freq: Two times a day (BID) | ORAL | 0 refills | Status: AC

## 2023-02-12 NOTE — ED Triage Notes (Signed)
Patient has right upper arm pain and right axillary pain, swelling, redness x 1 week.   Patient states she has been taking Ibuprofen, and hot compresses .

## 2023-02-12 NOTE — Discharge Instructions (Signed)
You have been given a shot of Toradol 30 mg today.  Ketorolac 10 mg tablets--take 1 tablet every 6 hours as needed for pain.  This is the same medicine that is in the shot we just gave you  Take doxycycline 100 mg --1 capsule 2 times daily for 7 days  Continue using warm compresses.  You can use the QR code/website at the back of the summary paperwork to schedule yourself a new patient appointment with primary care

## 2023-02-12 NOTE — ED Provider Notes (Signed)
[facility]    CSN: [id number] Arrival date & time: 02/12/23  1748      History   Chief Complaint Chief Complaint  Patient presents with   Arm Pain    HPI Joy Hartman is a 32 y.o. female.    Arm Pain  Here for pain and redness and swelling in her right axilla and right medial upper arm.  Is been going on for about 1 week.  No fever or chills or nausea or vomiting.     she has never had this before.   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 The result indicates an increased chance to be a silent (2+0) carrier of SMA.    Chlamydia    Gonorrhea    Headache    History of blood transfusion 2022   with vaginal delivery   Hypertension    currently no meds   Panic attack    Trichomonas     Patient Active Problem List   Diagnosis Date Noted   Postpartum care following vaginal delivery 01/13/2022   HGSIL (high grade squamous intraepithelial lesion) on Pap smear of cervix 01/13/2022   Unwanted fertility 01/13/2022   History of eclampsia 10/05/2021   Chronic nonintractable headache 09/07/2020   Panic attacks 03/30/2014   Asthma 07/13/2010    Past Surgical History:  Procedure Laterality Date   LAPAROSCOPIC BILATERAL SALPINGECTOMY Bilateral 03/07/2022   Procedure: LAPAROSCOPIC BILATERAL SALPINGECTOMY;  Surgeon: Hermina Staggers, MD;  Location: [facility];  Service: Gynecology;  Laterality: Bilateral;   WISDOM TOOTH EXTRACTION      OB History     Gravida  4   Para  3   Term  3   Preterm  0   AB  1   Living  3      SAB  1   IAB  0   Ectopic  0   Multiple  0   Live Births  3            Home Medications    Prior to Admission medications   Medication Sig Start Date End Date Taking?  Authorizing Provider  doxycycline (VIBRAMYCIN) 100 MG capsule Take 1 capsule (100 mg total) by mouth 2 (two) times daily for 7 days. 02/12/23 02/19/23 Yes Fausto Sampedro, Janace Aris, MD  ketorolac (TORADOL) 10 MG tablet Take 1 tablet (10 mg total) by mouth every 6 (six) hours as needed (pain). 02/12/23  Yes Zenia Resides, MD    Family History Family History  Problem Relation Age of Onset   Asthma Mother    Hypertension Mother    Hypertension Father    Hypertension Maternal Grandmother    Hypertension Maternal Grandfather    Hypertension Paternal Grandmother    Hypertension Paternal Grandfather     Social History Social History   Tobacco Use   Smoking status: Never    Passive exposure: Never   Smokeless tobacco: Never  Vaping Use   Vaping status: Never Used  Substance Use Topics   Alcohol use: Not Currently    Comment: wine   Drug use: Not Currently    Types: Marijuana    Comment: Last use was approx  02/11/22  Allergies   Codeine and Hydrocodone   Review of Systems Review of Systems   Physical Exam Triage Vital Signs ED Triage Vitals  Encounter Vitals Group     BP 02/12/23 1909 124/84     Systolic BP Percentile --      Diastolic BP Percentile --      Pulse Rate 02/12/23 1909 87     Resp 02/12/23 1909 16     Temp 02/12/23 1909 98.7 F (37.1 C)     Temp Source 02/12/23 1909 Oral     SpO2 02/12/23 1909 99 %     Weight --      Height --      Head Circumference --      Peak Flow --      Pain Score 02/12/23 1911 8     Pain Loc --      Pain Education --      Exclude from Growth Chart --    No data found.  Updated Vital Signs BP 124/84 (BP Location: Left Arm)   Pulse 87   Temp 98.7 F (37.1 C) (Oral)   Resp 16   LMP 01/14/2023   SpO2 99%   Visual Acuity Right Eye Distance:   Left Eye Distance:   Bilateral Distance:    Right Eye Near:   Left Eye Near:    Bilateral Near:     Physical Exam Vitals reviewed.  Constitutional:      General: She is  not in acute distress.    Appearance: She is not ill-appearing, toxic-appearing or diaphoretic.  Skin:    Coloration: Skin is not jaundiced or pale.     Comments: There is erythema and induration of the skin of the right axilla on the medial surface of the upper arm and also there is lots of induration and tenderness in the axilla.  There is no fluctuance at this time.  Neurological:     General: No focal deficit present.     Mental Status: She is alert and oriented to person, place, and time.  Psychiatric:        Behavior: Behavior normal.      UC Treatments / Results  Labs (all labs ordered are listed, but only abnormal results are displayed) Labs Reviewed - No data to display  EKG   Radiology No results found.  Procedures Procedures (including critical care time)  Medications Ordered in UC Medications  ketorolac (TORADOL) 30 MG/ML injection 30 mg (has no administration in time range)    Initial Impression / Assessment and Plan / UC Course  I have reviewed the triage vital signs and the nursing notes.  Pertinent labs & imaging results that were available during my care of the patient were reviewed by me and considered in my medical decision making (see chart for details).        Doxycycline is sent in for the infection.  Toradol injection is given here in Toradol tablets are sent to pharmacy.  She will continue warm compresses.  Discussed her following up with primary care She will return here if it is worsening or needs draining  Final Clinical Impressions(s) / UC Diagnoses   Final diagnoses:  Cellulitis of right axilla     Discharge Instructions      You have been given a shot of Toradol 30 mg today.  Ketorolac 10 mg tablets--take 1 tablet every 6 hours as needed for pain.  This is the same medicine that is in the shot we  just gave you  Take doxycycline 100 mg --1 capsule 2 times daily for 7 days  Continue using warm compresses.  You can use the QR  code/website at the back of the summary paperwork to schedule yourself a new patient appointment with primary care      ED Prescriptions     Medication Sig Dispense Auth. Provider   doxycycline (VIBRAMYCIN) 100 MG capsule Take 1 capsule (100 mg total) by mouth 2 (two) times daily for 7 days. 14 capsule Clyde Zarrella, Janace Aris, MD   ketorolac (TORADOL) 10 MG tablet Take 1 tablet (10 mg total) by mouth every 6 (six) hours as needed (pain). 20 tablet Moustafa Mossa, Janace Aris, MD      PDMP not reviewed this encounter.   Zenia Resides, MD 02/12/23 [phone]

## 2023-03-16 ENCOUNTER — Encounter (HOSPITAL_BASED_OUTPATIENT_CLINIC_OR_DEPARTMENT_OTHER): Payer: Self-pay

## 2023-03-16 ENCOUNTER — Other Ambulatory Visit: Payer: Self-pay

## 2023-03-16 ENCOUNTER — Emergency Department (HOSPITAL_BASED_OUTPATIENT_CLINIC_OR_DEPARTMENT_OTHER)
Admission: EM | Admit: 2023-03-16 | Discharge: 2023-03-16 | Disposition: A | Discharge status: Home / Self Care | Payer: 59 | Attending: Emergency Medicine | Admitting: Emergency Medicine

## 2023-03-16 ENCOUNTER — Emergency Department (HOSPITAL_BASED_OUTPATIENT_CLINIC_OR_DEPARTMENT_OTHER): Payer: 59

## 2023-03-16 ENCOUNTER — Ambulatory Visit
Admission: RE | Admit: 2023-03-16 | Discharge: 2023-03-16 | Disposition: A | Discharge status: Home / Self Care | Payer: 59 | Source: Ambulatory Visit | Attending: Family Medicine | Admitting: Family Medicine

## 2023-03-16 VITALS — BP 140/87 | HR 64 | Temp 98.9°F | Resp 15

## 2023-03-16 DIAGNOSIS — R1031 Right lower quadrant pain: Secondary | ICD-10-CM | POA: Diagnosis not present

## 2023-03-16 DIAGNOSIS — Z79899 Other long term (current) drug therapy: Secondary | ICD-10-CM | POA: Insufficient documentation

## 2023-03-16 DIAGNOSIS — Z0389 Encounter for observation for other suspected diseases and conditions ruled out: Secondary | ICD-10-CM | POA: Diagnosis not present

## 2023-03-16 DIAGNOSIS — I10 Essential (primary) hypertension: Secondary | ICD-10-CM | POA: Diagnosis not present

## 2023-03-16 DIAGNOSIS — J45909 Unspecified asthma, uncomplicated: Secondary | ICD-10-CM | POA: Diagnosis not present

## 2023-03-16 LAB — URINALYSIS, MICROSCOPIC (REFLEX): RBC / HPF: >50 RBC/hpf (ref 0–5)

## 2023-03-16 LAB — CBC WITH DIFFERENTIAL/PLATELET
Abs Immature Granulocytes: 0.01 10*3/uL (ref 0.00–0.07)
Basophils Absolute: 0 10*3/uL (ref 0.0–0.1)
Basophils Relative: 0 %
Eosinophils Absolute: 0.2 10*3/uL (ref 0.0–0.5)
Eosinophils Relative: 3 %
HCT: 32.6 % — ABNORMAL LOW (ref 36.0–46.0)
Hemoglobin: 10.8 g/dL — ABNORMAL LOW (ref 12.0–15.0)
Immature Granulocytes: 0 %
Lymphocytes Relative: 51 %
Lymphs Abs: 2.6 10*3/uL (ref 0.7–4.0)
MCH: 31.4 pg (ref 26.0–34.0)
MCHC: 33.1 g/dL (ref 30.0–36.0)
MCV: 94.8 fL (ref 80.0–100.0)
Monocytes Absolute: 0.5 10*3/uL (ref 0.1–1.0)
Monocytes Relative: 9 %
Neutro Abs: 2 10*3/uL (ref 1.7–7.7)
Neutrophils Relative %: 37 %
Platelets: 207 10*3/uL (ref 150–400)
RBC: 3.44 MIL/uL — ABNORMAL LOW (ref 3.87–5.11)
RDW: 12.7 % (ref 11.5–15.5)
WBC: 5.2 10*3/uL (ref 4.0–10.5)
nRBC: 0 % (ref 0.0–0.2)

## 2023-03-16 LAB — COMPREHENSIVE METABOLIC PANEL
ALT: 11 U/L (ref 0–44)
AST: 14 U/L — ABNORMAL LOW (ref 15–41)
Albumin: 3.7 g/dL (ref 3.5–5.0)
Alkaline Phosphatase: 61 U/L (ref 38–126)
Anion gap: 14 (ref 5–15)
BUN: 7 mg/dL (ref 6–20)
CO2: 24 mmol/L (ref 22–32)
Calcium: 9 mg/dL (ref 8.9–10.3)
Chloride: 100 mmol/L (ref 98–111)
Creatinine, Ser: 0.72 mg/dL (ref 0.44–1.00)
GFR, Estimated: >60 mL/min (ref >60)
Glucose, Bld: 89 mg/dL (ref 70–99)
Potassium: 3.5 mmol/L (ref 3.5–5.1)
Sodium: 138 mmol/L (ref 135–145)
Total Bilirubin: 0.9 mg/dL (ref 0.3–1.2)
Total Protein: 7.7 g/dL (ref 6.5–8.1)

## 2023-03-16 LAB — POCT URINALYSIS DIP (MANUAL ENTRY)
Glucose, UA: NEGATIVE mg/dL
Ketones, POC UA: NEGATIVE mg/dL
Leukocytes, UA: NEGATIVE
Nitrite, UA: NEGATIVE
Protein Ur, POC: 100 mg/dL — AB
Spec Grav, UA: >=1.03 — AB (ref 1.010–1.025)
Urobilinogen, UA: 1 U/dL
pH, UA: 6 (ref 5.0–8.0)

## 2023-03-16 LAB — URINALYSIS, ROUTINE W REFLEX MICROSCOPIC

## 2023-03-16 LAB — POCT URINE PREGNANCY: Preg Test, Ur: NEGATIVE

## 2023-03-16 LAB — PREGNANCY, URINE: Preg Test, Ur: NEGATIVE

## 2023-03-16 MED ORDER — FENTANYL CITRATE PF 50 MCG/ML IJ SOSY
50.0000 ug | PREFILLED_SYRINGE | Freq: Once | INTRAMUSCULAR | Status: AC
  Administered 2023-03-16: 50 ug via NASAL
  Filled 2023-03-16: qty 1

## 2023-03-16 MED ORDER — IOHEXOL 300 MG/ML  SOLN
100.0000 mL | Freq: Once | INTRAMUSCULAR | Status: AC | PRN
  Administered 2023-03-16: 80 mL via INTRAVENOUS

## 2023-03-16 MED ORDER — CELECOXIB 200 MG PO CAPS: 200.0000 mg | ORAL_CAPSULE | Freq: Two times a day (BID) | ORAL | 0 refills | Status: AC

## 2023-03-16 MED ORDER — ONDANSETRON HCL 4 MG/2ML IJ SOLN
4.0000 mg | Freq: Once | INTRAMUSCULAR | Status: AC
  Administered 2023-03-16: 4 mg via INTRAVENOUS
  Filled 2023-03-16: qty 2

## 2023-03-16 MED ORDER — ONDANSETRON HCL 4 MG PO TABS: 4.0000 mg | ORAL_TABLET | Freq: Four times a day (QID) | ORAL | 0 refills | Status: AC | PRN

## 2023-03-16 NOTE — Discharge Instructions (Signed)
You were seen today for right lower abdominal pain.  Given your level of pain and location you need further care/imaging.  Please go to the ER for further evaluation.

## 2023-03-16 NOTE — ED Notes (Signed)
Patient transported to CT 

## 2023-03-16 NOTE — ED Triage Notes (Signed)
  Pt presents to UC w/ c/o lower right sided abd pain x1 week. Worse past 2 days. Throwing up clear liquid x5 days. Hurts more when standing up. Pt reports she has been drinking water but has no appetite.

## 2023-03-16 NOTE — ED Provider Notes (Signed)
[facility] Provider Note  CSN: [id number] Arrival date & time: 03/16/23 1112  Chief Complaint(s) Abdominal Pain  HPI Joy Hartman is a 32 y.o. female here today for right lower quadrant abdominal pain that has been ongoing for the last 7 days.  She has had 1 episode of vomiting.  She does not have any dysuria or increased urinary frequency.  Patient has a history of tubal ligation.  She is currently on her period.     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 The result indicates an increased chance to be a silent (2+0) carrier of SMA.    Chlamydia    Gonorrhea    Headache    History of blood transfusion 2022   with vaginal delivery   Hypertension    currently no meds   Panic attack    Trichomonas    Patient Active Problem List   Diagnosis Date Noted   Postpartum care following vaginal delivery 01/13/2022   HGSIL (high grade squamous intraepithelial lesion) on Pap smear of cervix 01/13/2022   Unwanted fertility 01/13/2022   History of eclampsia 10/05/2021   Chronic nonintractable headache 09/07/2020   Panic attacks 03/30/2014   Asthma 07/13/2010   Home Medication(s) Prior to Admission medications   Medication Sig Start Date End Date Taking? Authorizing Provider  ketorolac (TORADOL) 10 MG tablet Take 1 tablet (10 mg total) by mouth every 6 (six) hours as needed (pain). 02/12/23   Zenia Resides, MD                                                                                                                                    Past Surgical History Past Surgical History:  Procedure Laterality Date   LAPAROSCOPIC BILATERAL SALPINGECTOMY Bilateral 03/07/2022   Procedure: LAPAROSCOPIC BILATERAL SALPINGECTOMY;  Surgeon: Hermina Staggers, MD;  Location: [facility] OR;  Service:  Gynecology;  Laterality: Bilateral;   WISDOM TOOTH EXTRACTION     Family History Family History  Problem Relation Age of Onset   Asthma Mother    Hypertension Mother    Hypertension Father    Hypertension Maternal Grandmother    Hypertension Maternal Grandfather    Hypertension Paternal Grandmother    Hypertension Paternal Grandfather     Social History Social History   Tobacco Use   Smoking status: Never    Passive exposure: Never   Smokeless tobacco: Never  Vaping Use   Vaping status: Never Used  Substance Use Topics   Alcohol use: Not Currently    Comment: wine   Drug use: Not Currently    Types: Marijuana    Comment: Last use was approx  02/11/22   Allergies Codeine and Hydrocodone  Review of Systems  Review of Systems  Physical Exam Vital Signs  I have reviewed the triage vital signs BP (!) 146/92   Pulse 70   Temp 97.9 F (36.6 C) (Oral)   Resp 18   Ht 5\' 6"  (1.676 m)   Wt 83.9 kg   LMP 03/14/2023 (Exact Date)   SpO2 100%   BMI 29.86 kg/m   Physical Exam Cardiovascular:     Rate and Rhythm: Normal rate.  Pulmonary:     Effort: Pulmonary effort is normal.  Abdominal:     General: Abdomen is flat.     Palpations: Abdomen is soft.     Tenderness: There is abdominal tenderness in the right lower quadrant. There is no guarding.  Neurological:     Mental Status: She is alert.     ED Results and Treatments Labs (all labs ordered are listed, but only abnormal results are displayed) Labs Reviewed  COMPREHENSIVE METABOLIC PANEL  CBC WITH DIFFERENTIAL/PLATELET  URINALYSIS, ROUTINE W REFLEX MICROSCOPIC  PREGNANCY, URINE                                                                                                                          Radiology No results found.  Pertinent labs & imaging results that were available during my care of the patient were reviewed by me and considered in my medical decision making (see MDM for  details).  Medications Ordered in ED Medications  fentaNYL (SUBLIMAZE) injection 50 mcg (has no administration in time range)  ondansetron (ZOFRAN) injection 4 mg (has no administration in time range)                                                                                                                                     Procedures Procedures  (including critical care time)  Medical Decision Making / ED Course   This patient presents to the ED for concern of abdominal pain, this involves an extensive number of treatment options, and is a complaint that carries with it a high risk of complications and morbidity.  The differential diagnosis includes appendicitis, considered ovarian torsion, considered ovarian abscess, intra-abdominal infection, cystitis, less likely ectopic.    MDM: 32 year old female here today for abdominal pain.  Based on her exam, I have lower suspicion for ovarian torsion.  Patient overall looks comfortable.  Her abdominal exam does elicit some right lower quadrant tenderness.  Will  obtain imaging to assess for appendicitis.   Additional history obtained: -Additional history obtained from urgent care note -External records from outside source obtained and reviewed including: Chart review including previous notes, labs, imaging, consultation notes   Lab Tests: -I ordered, reviewed, and interpreted labs.   The pertinent results include:   Labs Reviewed  COMPREHENSIVE METABOLIC PANEL  CBC WITH DIFFERENTIAL/PLATELET  URINALYSIS, ROUTINE W REFLEX MICROSCOPIC  PREGNANCY, URINE      EKG   EKG Interpretation Date/Time:    Ventricular Rate:    PR Interval:    QRS Duration:    QT Interval:    QTC Calculation:   R Axis:      Text Interpretation:           Imaging Studies ordered: I ordered imaging studies including CT imaging the abdomen pelvis I independently visualized and interpreted imaging. I agree with the radiologist  interpretation   Medicines ordered and prescription drug management: Meds ordered this encounter  Medications   fentaNYL (SUBLIMAZE) injection 50 mcg   ondansetron (ZOFRAN) injection 4 mg    -I have reviewed the patients home medicines and have made adjustments as needed     Cardiac Monitoring: The patient was maintained on a cardiac monitor.  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 The result indicates an increased chance to be a silent (2+0) carrier of SMA.    Chlamydia    Gonorrhea    Headache    History of blood transfusion 2022   with vaginal delivery   Hypertension    currently no meds   Panic attack    Trichomonas       Dispostion: Patient signed out to Dr. Doran Durand pending CT imaging and disposition.    Final Clinical Impression(s) / ED Diagnoses Final diagnoses:  None     @PCDICTATION @    Anders Simmonds T, DO 03/16/23 1505

## 2023-03-16 NOTE — ED Provider Notes (Signed)
Care of patient received from prior provider at 4:48 PM, please see their note for complete H/P and care plan.  Received handoff per ED course.  Clinical Course as of 03/16/23 1648  Fri Mar 16, 2023  1512 Stable HO JTS RLQ AB Pain. CTAP pending. S/P Tubal.  [CC]    Clinical Course User Index [CC] Glyn Ade, MD    Reassessment: On reassessment CT has no focal pathology, pain is improved is ambulatory tolerating p.o. intake no acute distress.  Given resolution of syndrome and well appearance on my exam I believe patient stable for outpatient care management with strict return precautions reinforced.  Disposition:  I have considered need for hospitalization, however, considering all of the above, I believe this patient is stable for discharge at this time.  Patient/family educated about specific return precautions for given chief complaint and symptoms.  Patient/family educated about follow-up with PCP.     Patient/family expressed understanding of return precautions and need for follow-up. Patient spoken to regarding all imaging and laboratory results and appropriate follow up for these results. All education provided in verbal form with additional information in written form. Time was allowed for answering of patient questions. Patient discharged.    Emergency Department Medication Summary:   Medications  fentaNYL (SUBLIMAZE) injection 50 mcg (50 mcg Nasal Given 03/16/23 1233)  ondansetron (ZOFRAN) injection 4 mg (4 mg Intravenous Given 03/16/23 1233)  iohexol (OMNIPAQUE) 300 MG/ML solution 100 mL (80 mLs Intravenous Contrast Given 03/16/23 1344)          Glyn Ade, MD 03/16/23 1648

## 2023-03-16 NOTE — ED Triage Notes (Signed)
Pt presents to UC w/ c/o lower right sided abd pain x1 week. Worse past 2 days. Throwing up clear liquid x5 days. Hurts more when standing up. Pt reports she has been drinking water but has no appetite.

## 2023-03-16 NOTE — ED Notes (Signed)
Patient is being discharged from the Urgent Care and sent to the Emergency Department via POV. Per Jannifer Franklin MD, patient is in need of higher level of care due to abdominal pain. Patient is aware and verbalizes understanding of plan of care.  Vitals:   03/16/23 0931 03/16/23 0934  BP: (!) 140/87   Pulse:  64  Resp: 15   Temp: 98.9 F (37.2 C)   SpO2:  99%

## 2023-03-16 NOTE — ED Notes (Signed)
Patient given liquids for a PO challenge.

## 2023-03-16 NOTE — ED Provider Notes (Signed)
[facility]    CSN: [id number] Arrival date & time: 03/16/23  4098      History   Chief Complaint Chief Complaint  Patient presents with   Abdominal Pain    Very sharp pain in my lower abdomen on the right side, throwing up clear liquid, feel clammy - Entered by patient    HPI Joy Hartman is a 32 y.o. female.    Abdominal Pain Associated symptoms: chills and nausea   Associated symptoms: no constipation, no dysuria and no vomiting   Patient is here for lower right abd pain. Going on about a week, but worse the last several days.  Decreased appetite, vomiting clear liquids.  Yesterday was on the couch all day.  She did take motrin last night, and a bit better, but still at  7/10 in pain.  She still has her appendix.  LMP was 2 days ago (on her period now).  She has had chills, but no fever.        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 The result indicates an increased chance to be a silent (2+0) carrier of SMA.    Chlamydia    Gonorrhea    Headache    History of blood transfusion 2022   with vaginal delivery   Hypertension    currently no meds   Panic attack    Trichomonas     Patient Active Problem List   Diagnosis Date Noted   Postpartum care following vaginal delivery 01/13/2022   HGSIL (high grade squamous intraepithelial lesion) on Pap smear of cervix 01/13/2022   Unwanted fertility 01/13/2022   History of eclampsia 10/05/2021   Chronic nonintractable headache 09/07/2020   Panic attacks 03/30/2014   Asthma 07/13/2010    Past Surgical History:  Procedure Laterality Date   LAPAROSCOPIC BILATERAL SALPINGECTOMY Bilateral 03/07/2022   Procedure: LAPAROSCOPIC BILATERAL SALPINGECTOMY;  Surgeon: Hermina Staggers, MD;  Location: [facility] OR;  Service: Gynecology;  Laterality: Bilateral;   WISDOM TOOTH EXTRACTION       OB History     Gravida  4   Para  3   Term  3   Preterm  0   AB  1   Living  3      SAB  1   IAB  0   Ectopic  0   Multiple  0   Live Births  3            Home Medications    Prior to Admission medications   Medication Sig Start Date End Date Taking? Authorizing Provider  ketorolac (TORADOL) 10 MG tablet Take 1 tablet (10 mg total) by mouth every 6 (six) hours as needed (pain). 02/12/23   Zenia Resides, MD    Family History Family History  Problem Relation Age of Onset   Asthma Mother    Hypertension Mother    Hypertension Father    Hypertension Maternal Grandmother    Hypertension Maternal Grandfather    Hypertension Paternal Grandmother    Hypertension Paternal Grandfather     Social History Social History   Tobacco Use   Smoking status: Never    Passive exposure: Never   Smokeless tobacco: Never  Vaping Use   Vaping status: Never Used  Substance Use Topics   Alcohol use: Not Currently  Comment: wine   Drug use: Not Currently    Types: Marijuana    Comment: Last use was approx  02/11/22     Allergies   Codeine and Hydrocodone   Review of Systems Review of Systems  Constitutional:  Positive for chills.  HENT: Negative.    Respiratory: Negative.    Cardiovascular: Negative.   Gastrointestinal:  Positive for abdominal pain and nausea. Negative for constipation and vomiting.  Genitourinary:  Negative for dysuria and frequency.  Musculoskeletal: Negative.   Skin: Negative.   Psychiatric/Behavioral: Negative.       Physical Exam Triage Vital Signs ED Triage Vitals  Encounter Vitals Group     BP 03/16/23 0931 (!) 140/87     Systolic BP Percentile --      Diastolic BP Percentile --      Pulse Rate 03/16/23 0934 64     Resp 03/16/23 0931 15     Temp 03/16/23 0931 98.9 F (37.2 C)     Temp Source 03/16/23 0931 Oral     SpO2 03/16/23 0934 99 %     Weight --      Height --      Head Circumference --      Peak Flow  --      Pain Score 03/16/23 0933 7     Pain Loc --      Pain Education --      Exclude from Growth Chart --    No data found.  Updated Vital Signs BP (!) 140/87 (BP Location: Right Arm)   Pulse 64   Temp 98.9 F (37.2 C) (Oral)   Resp 15   LMP 03/14/2023 (Exact Date)   SpO2 99%   Breastfeeding No   Visual Acuity Right Eye Distance:   Left Eye Distance:   Bilateral Distance:    Right Eye Near:   Left Eye Near:    Bilateral Near:     Physical Exam Constitutional:      General: She is not in acute distress.    Appearance: She is well-developed. She is not ill-appearing.  Cardiovascular:     Rate and Rhythm: Normal rate and regular rhythm.  Pulmonary:     Effort: Pulmonary effort is normal.     Breath sounds: Normal breath sounds.  Abdominal:     General: Abdomen is flat. Bowel sounds are decreased.     Palpations: Abdomen is soft.     Tenderness: There is generalized abdominal tenderness. There is no right CVA tenderness, left CVA tenderness, guarding or rebound.     Comments: She has the most TTP to the RLQ, referred pain to the RLQ with palpation across the abdomen  Neurological:     Mental Status: She is alert.      UC Treatments / Results  Labs (all labs ordered are listed, but only abnormal results are displayed) Labs Reviewed  POCT URINALYSIS DIP (MANUAL ENTRY) - Abnormal; Notable for the following components:      Result Value   Color, UA red (*)    Clarity, UA turbid (*)    Bilirubin, UA small (*)    Spec Grav, UA >=1.030 (*)    Blood, UA large (*)    Protein Ur, POC =100 (*)    All other components within normal limits  POCT URINE PREGNANCY   Upt negative  EKG   Radiology No results found.  Procedures Procedures (including critical care time)  Medications Ordered in UC Medications - No data to  display  Initial Impression / Assessment and Plan / UC Course  I have reviewed the triage vital signs and the nursing notes.  Pertinent labs  & imaging results that were available during my care of the patient were reviewed by me and considered in my medical decision making (see chart for details).   Final Clinical Impressions(s) / UC Diagnoses   Final diagnoses:  RLQ abdominal pain     Discharge Instructions      You were seen today for right lower abdominal pain.  Given your level of pain and location you need further care/imaging.  Please go to the ER for further evaluation.      ED Prescriptions   None    PDMP not reviewed this encounter.   Jannifer Franklin, MD 03/16/23 [phone]

## 2023-03-19 NOTE — Plan of Care (Signed)
[facility] Tonsillectomy/Adenoidectomy, Postoperative PEDS care plan entered in error.

## 2023-04-18 NOTE — Progress Notes (Deleted)
  Cardiology Office Note:  .   Date:  04/18/2023  ID:  Joy Hartman, DOB [birth year], MRN [id number] PCP: Rema Fendt, NP  [facility] Providers Cardiologist:  None { Click to update primary MD,subspecialty MD or APP then REFRESH:1}   History of Present Illness: Joy Hartman is a 32 y.o. female with hx of costochrondritis / chest pain     ROS: ***  Studies Reviewed: .        *** Risk Assessment/Calculations:   {Does this patient have ATRIAL FIBRILLATION?:[phone]} No BP recorded.  {Refresh Note OR Click here to enter BP  :1}***       Physical Exam:   VS:  There were no vitals taken for this visit.   Wt Readings from Last 3 Encounters:  03/16/23 185 lb (83.9 kg)  12/09/22 185 lb (83.9 kg)  03/30/22 181 lb 14.4 oz (82.5 kg)    GEN: Well nourished, well developed in no acute distress NECK: No JVD; No carotid bruits CARDIAC: ***RRR, no murmurs, rubs, gallops RESPIRATORY:  Clear to auscultation without rales, wheezing or rhonchi  ABDOMEN: Soft, non-tender, non-distended EXTREMITIES:  No edema; No deformity   ASSESSMENT AND PLAN: .   ***    {Are you ordering a CV Procedure (e.g. stress test, cath, DCCV, TEE, etc)?   Press F2        :[id number]}  Dispo: ***  Signed, Kristeen Miss, MD

## 2023-04-20 ENCOUNTER — Ambulatory Visit: Payer: 59 | Attending: Cardiovascular Disease | Admitting: Cardiovascular Disease

## 2023-05-18 ENCOUNTER — Encounter: Payer: Self-pay | Admitting: Family

## 2023-05-18 NOTE — Telephone Encounter (Signed)
 Care team updated and letter sent for eye exam notes.

## 2023-05-25 ENCOUNTER — Ambulatory Visit (HOSPITAL_COMMUNITY): Payer: 59

## 2024-01-31 ENCOUNTER — Ambulatory Visit

## 2024-02-03 ENCOUNTER — Encounter (HOSPITAL_BASED_OUTPATIENT_CLINIC_OR_DEPARTMENT_OTHER): Payer: Self-pay

## 2024-02-03 ENCOUNTER — Emergency Department (HOSPITAL_BASED_OUTPATIENT_CLINIC_OR_DEPARTMENT_OTHER)
Admission: EM | Admit: 2024-02-03 | Discharge: 2024-02-03 | Disposition: A | Discharge status: Home / Self Care | Attending: Emergency Medicine | Admitting: Emergency Medicine

## 2024-02-03 ENCOUNTER — Emergency Department (HOSPITAL_BASED_OUTPATIENT_CLINIC_OR_DEPARTMENT_OTHER)

## 2024-02-03 ENCOUNTER — Other Ambulatory Visit: Payer: Self-pay

## 2024-02-03 DIAGNOSIS — R519 Headache, unspecified: Secondary | ICD-10-CM | POA: Diagnosis not present

## 2024-02-03 DIAGNOSIS — J069 Acute upper respiratory infection, unspecified: Secondary | ICD-10-CM | POA: Diagnosis not present

## 2024-02-03 DIAGNOSIS — R059 Cough, unspecified: Secondary | ICD-10-CM | POA: Diagnosis present

## 2024-02-03 DIAGNOSIS — J45909 Unspecified asthma, uncomplicated: Secondary | ICD-10-CM | POA: Diagnosis not present

## 2024-02-03 LAB — RESP PANEL BY RT-PCR (RSV, FLU A&B, COVID)  RVPGX2
Influenza A by PCR: NEGATIVE
Influenza B by PCR: NEGATIVE
Resp Syncytial Virus by PCR: NEGATIVE
SARS Coronavirus 2 by RT PCR: NEGATIVE

## 2024-02-03 MED ORDER — SODIUM CHLORIDE 0.9 % IV BOLUS
1000.0000 mL | Freq: Once | INTRAVENOUS | Status: AC
  Administered 2024-02-03: 1000 mL via INTRAVENOUS

## 2024-02-03 MED ORDER — VENTOLIN HFA 108 (90 BASE) MCG/ACT IN AERS: 2.0000 | INHALATION_SPRAY | Freq: Four times a day (QID) | RESPIRATORY_TRACT | 0 refills | Status: AC | PRN

## 2024-02-03 MED ORDER — KETOROLAC TROMETHAMINE 15 MG/ML IJ SOLN
15.0000 mg | Freq: Once | INTRAMUSCULAR | Status: AC
  Administered 2024-02-03: 15 mg via INTRAVENOUS
  Filled 2024-02-03: qty 1

## 2024-02-03 MED ORDER — ACETAMINOPHEN 325 MG PO TABS
650.0000 mg | ORAL_TABLET | Freq: Once | ORAL | Status: AC | PRN
  Administered 2024-02-03: 650 mg via ORAL
  Filled 2024-02-03: qty 2

## 2024-02-03 NOTE — ED Notes (Signed)
 ED Provider at bedside.

## 2024-02-03 NOTE — ED Provider Notes (Signed)
 [place] [facility] Provider Note   CSN: [id number] Arrival date & time: 02/03/24  9053     Patient presents with: Headache and Cough   Joy Hartman is a 33 y.o. female who presents to the ED today with primary concern of a headache that has presented for the last 2 weeks and then this morning having acute onset of shortness of breath along with a nonproductive cough.  It is noted that she has a previous history of mild intermittent asthma for which she only has a albuterol  inhaler for at this time.  Further medical history noted of primary hypertension.    Headache Associated symptoms: cough   Cough Associated symptoms: headaches and shortness of breath        Prior to Admission medications   Medication Sig Start Date End Date Taking? Authorizing Provider  albuterol  (VENTOLIN  HFA) 108 (90 Base) MCG/ACT inhaler Inhale 2 puffs into the lungs every 6 (six) hours as needed for wheezing or shortness of breath. 02/03/24  Yes Myriam Dorn BROCKS, PA  celecoxib  (CELEBREX ) 200 MG capsule Take 1 capsule (200 mg total) by mouth 2 (two) times daily. 03/16/23   Jerral Meth, MD  ondansetron  (ZOFRAN ) 4 MG tablet Take 1 tablet (4 mg total) by mouth every 6 (six) hours as needed for up to 30 doses for nausea or vomiting. 03/16/23   Jerral Meth, MD    Allergies: Codeine and Hydrocodone    Review of Systems  Respiratory:  Positive for cough and shortness of breath.   Neurological:  Positive for headaches.  All other systems reviewed and are negative.   Updated Vital Signs BP 131/72 (BP Location: Left Arm)   Pulse (!) 109   Temp 100.1 F (37.8 C) (Oral)   Resp 20   Wt 83 kg   LMP 01/27/2024   SpO2 97%   BMI 29.54 kg/m   Physical Exam Vitals and nursing note reviewed.  Constitutional:      General: She is not in acute distress.    Appearance: Normal appearance.  HENT:     Head: Normocephalic and atraumatic.     Mouth/Throat:      Lips: Pink.     Mouth: Mucous membranes are moist.     Pharynx: Oropharynx is clear. Uvula midline. Posterior oropharyngeal erythema present.     Comments: Postnasal drip and erythema noted in the posterior oropharynx. Eyes:     General: No visual field deficit.    Extraocular Movements: Extraocular movements intact.     Conjunctiva/sclera: Conjunctivae normal.     Pupils: Pupils are equal, round, and reactive to light.  Cardiovascular:     Rate and Rhythm: Normal rate and regular rhythm.     Pulses: Normal pulses.     Heart sounds: Normal heart sounds. No murmur heard.    No friction rub. No gallop.  Pulmonary:     Effort: Pulmonary effort is normal.     Breath sounds: Normal breath sounds.  Abdominal:     General: Abdomen is flat. Bowel sounds are normal.     Palpations: Abdomen is soft.  Musculoskeletal:        General: Normal range of motion.     Cervical back: Normal range of motion and neck supple.     Right lower leg: No edema.     Left lower leg: No edema.  Skin:    General: Skin is warm and dry.     Capillary Refill: Capillary refill  takes less than 2 seconds.  Neurological:     General: No focal deficit present.     Mental Status: She is alert and oriented to person, place, and time. Mental status is at baseline.     GCS: GCS eye subscore is 4. GCS verbal subscore is 5. GCS motor subscore is 6.     Cranial Nerves: No cranial nerve deficit, dysarthria or facial asymmetry.     Sensory: No sensory deficit.     Motor: No weakness.  Psychiatric:        Mood and Affect: Mood normal.        Speech: Speech normal.        Behavior: Behavior normal.     (all labs ordered are listed, but only abnormal results are displayed) Labs Reviewed  RESP PANEL BY RT-PCR (RSV, FLU A&B, COVID)  [id number]    EKG: None  Radiology: DG Chest 2 View Result Date: 02/03/2024 CLINICAL DATA:  Cough. EXAM: CHEST - 2 VIEW COMPARISON:  None Available. FINDINGS: The heart size and mediastinal  contours are within normal limits. Low lung volumes are noted. Very mild atelectasis and/or infiltrate is seen within the left lung base. No pleural effusion or pneumothorax is identified. The visualized skeletal structures are unremarkable. IMPRESSION: Very mild left basilar atelectasis and/or infiltrate. Electronically Signed   By: Suzen Dials M.D.   On: 02/03/2024 10:37     Procedures   Medications Ordered in the ED  acetaminophen  (TYLENOL ) tablet 650 mg (650 mg Oral Given 02/03/24 1014)  sodium chloride  0.9 % bolus 1,000 mL (1,000 mLs Intravenous New Bag/Given 02/03/24 1111)  ketorolac  (TORADOL ) 15 MG/ML injection 15 mg (15 mg Intravenous Given 02/03/24 1117)                                    Medical Decision Making Amount and/or Complexity of Data Reviewed Radiology: ordered.  Risk OTC drugs. Prescription drug management.   Medical Decision Making:   Joy Hartman is a 33 y.o. female who presented to the ED today with headache, cough, shortness of breath.  Detailed above.    External chart has been reviewed including primary care records as well as previous labs and imaging.. Complete initial physical exam performed, notably the patient  was alert and oriented in no apparent distress.  The pulmonary auscultation is unremarkable with good air entry in all fields no adventitious sounds appreciated.  Did appreciate posterior oropharyngeal erythema with notable clear postnasal drip..    Reviewed and confirmed nursing documentation for past medical history, family history, social history.    Initial Assessment:   With the patient's presentation of headache as well as acute shortness of breath, consider asthma exacerbation, viral syndrome, fluid volume deficit in regards to the headache.  In regards to headache as well, consider migraines however with the global nature of the headache, find is less than likely.  Initial Plan:  Obtain respiratory panel swab to assess for  potential Secondary to the fever, was provided with acetaminophen  at triage.  This did not lower her fever so we will provide her with IV fluid bolus and reassess. CXR to evaluate for structural/infectious intrathoracic pathology.  Objective evaluation as below reviewed   Initial Study Results:   Laboratory  All laboratory results reviewed without evidence of clinically relevant pathology.   Exceptions include: None  Radiology:  All images reviewed independently. Agree with radiology report at  this time.   DG Chest 2 View Result Date: 02/03/2024 CLINICAL DATA:  Cough. EXAM: CHEST - 2 VIEW COMPARISON:  None Available. FINDINGS: The heart size and mediastinal contours are within normal limits. Low lung volumes are noted. Very mild atelectasis and/or infiltrate is seen within the left lung base. No pleural effusion or pneumothorax is identified. The visualized skeletal structures are unremarkable. IMPRESSION: Very mild left basilar atelectasis and/or infiltrate. Electronically Signed   By: Suzen Dials M.D.   On: 02/03/2024 10:37    Reassessment and Plan:   Respiratory panel swab was negative for COVID/flu/RSV.  Chest x-ray is largely unremarkable, there was some very mild atelectasis in the left base, however in the presence of good air entry into the bilateral lung fields on physical exam, the this may be secondary to hypoventilation.  Fever is reduced with oral antipyretics as well as with IV Toradol .  Provided her with a liter of IV fluids which improved her tachycardia as well as her fever.  As such, believe that this patient is stable and reasonable for outpatient therapy and discharge.  Will provide her with a prescription of her bronchodilator as this has been used multiple times, prevent her from running out of this.  Otherwise symptomatic management, follow-up with primary care as needed for management of her continuing symptoms.       Final diagnoses:  Viral upper respiratory  tract infection    ED Discharge Orders          Ordered    albuterol  (VENTOLIN  HFA) 108 (90 Base) MCG/ACT inhaler  Every 6 hours PRN        02/03/24 1229               Myriam Dorn BROCKS, [place] 02/03/24 1230    Charlyn Sora, MD 02/03/24 1344

## 2024-02-03 NOTE — ED Triage Notes (Signed)
 Headache x 2 weeks. Cough began 2 days ago. Worse feeling over night.  Dizziness .Wheezing at night and using inhaler inhaler  Noted to have a congested cough Lungs clear
